# Patient Record
Sex: Female | Born: 1960
Health system: Southern US, Community
[De-identification: ages and names within clinical notes are randomized; demographics above are authoritative.]

## PROBLEM LIST (undated history)

## (undated) DIAGNOSIS — J189 Pneumonia, unspecified organism: Secondary | ICD-10-CM

## (undated) DIAGNOSIS — R197 Diarrhea, unspecified: Secondary | ICD-10-CM

## (undated) DIAGNOSIS — R6889 Other general symptoms and signs: Secondary | ICD-10-CM

## (undated) DIAGNOSIS — Z9119 Patient's noncompliance with other medical treatment and regimen: Secondary | ICD-10-CM

## (undated) DIAGNOSIS — Z91199 Patient's noncompliance with other medical treatment and regimen due to unspecified reason: Secondary | ICD-10-CM

## (undated) DIAGNOSIS — F329 Major depressive disorder, single episode, unspecified: Secondary | ICD-10-CM

## (undated) DIAGNOSIS — K219 Gastro-esophageal reflux disease without esophagitis: Secondary | ICD-10-CM

## (undated) DIAGNOSIS — E559 Vitamin D deficiency, unspecified: Secondary | ICD-10-CM

## (undated) DIAGNOSIS — N390 Urinary tract infection, site not specified: Secondary | ICD-10-CM

## (undated) DIAGNOSIS — L511 Stevens-Johnson syndrome: Secondary | ICD-10-CM

## (undated) DIAGNOSIS — E079 Disorder of thyroid, unspecified: Secondary | ICD-10-CM

## (undated) DIAGNOSIS — R51 Headache: Secondary | ICD-10-CM

## (undated) DIAGNOSIS — D649 Anemia, unspecified: Secondary | ICD-10-CM

## (undated) DIAGNOSIS — R519 Headache, unspecified: Secondary | ICD-10-CM

## (undated) DIAGNOSIS — M797 Fibromyalgia: Secondary | ICD-10-CM

## (undated) DIAGNOSIS — R413 Other amnesia: Secondary | ICD-10-CM

## (undated) DIAGNOSIS — Z8719 Personal history of other diseases of the digestive system: Secondary | ICD-10-CM

## (undated) DIAGNOSIS — F419 Anxiety disorder, unspecified: Secondary | ICD-10-CM

## (undated) DIAGNOSIS — M329 Systemic lupus erythematosus, unspecified: Secondary | ICD-10-CM

## (undated) DIAGNOSIS — F32A Depression, unspecified: Secondary | ICD-10-CM

## (undated) DIAGNOSIS — IMO0002 Reserved for concepts with insufficient information to code with codable children: Secondary | ICD-10-CM

## (undated) DIAGNOSIS — E119 Type 2 diabetes mellitus without complications: Secondary | ICD-10-CM

## (undated) HISTORY — PX: LIVER SURGERY: SHX698

## (undated) HISTORY — PX: TONSILLECTOMY: SUR1361

## (undated) HISTORY — PX: CHOLECYSTECTOMY: SHX55

---

## 2013-05-06 ENCOUNTER — Other Ambulatory Visit (HOSPITAL_COMMUNITY): Payer: Self-pay | Admitting: Family Medicine

## 2013-05-06 DIAGNOSIS — M5412 Radiculopathy, cervical region: Secondary | ICD-10-CM

## 2013-05-09 ENCOUNTER — Ambulatory Visit (HOSPITAL_COMMUNITY): Payer: Self-pay

## 2013-09-20 ENCOUNTER — Emergency Department (HOSPITAL_COMMUNITY)
Admission: EM | Admit: 2013-09-20 | Discharge: 2013-09-21 | Disposition: A | Discharge status: Home / Self Care | Payer: Self-pay | Attending: Emergency Medicine | Admitting: Emergency Medicine

## 2013-09-20 ENCOUNTER — Emergency Department (HOSPITAL_COMMUNITY): Payer: Self-pay

## 2013-09-20 ENCOUNTER — Encounter (HOSPITAL_COMMUNITY): Payer: Self-pay | Admitting: Emergency Medicine

## 2013-09-20 DIAGNOSIS — Y929 Unspecified place or not applicable: Secondary | ICD-10-CM | POA: Insufficient documentation

## 2013-09-20 DIAGNOSIS — IMO0001 Reserved for inherently not codable concepts without codable children: Secondary | ICD-10-CM

## 2013-09-20 DIAGNOSIS — E119 Type 2 diabetes mellitus without complications: Secondary | ICD-10-CM | POA: Insufficient documentation

## 2013-09-20 DIAGNOSIS — F17200 Nicotine dependence, unspecified, uncomplicated: Secondary | ICD-10-CM | POA: Insufficient documentation

## 2013-09-20 DIAGNOSIS — R404 Transient alteration of awareness: Secondary | ICD-10-CM | POA: Insufficient documentation

## 2013-09-20 DIAGNOSIS — Y9389 Activity, other specified: Secondary | ICD-10-CM | POA: Insufficient documentation

## 2013-09-20 DIAGNOSIS — IMO0002 Reserved for concepts with insufficient information to code with codable children: Secondary | ICD-10-CM | POA: Insufficient documentation

## 2013-09-20 DIAGNOSIS — Z880 Allergy status to penicillin: Secondary | ICD-10-CM | POA: Insufficient documentation

## 2013-09-20 HISTORY — DX: Disorder of thyroid, unspecified: E07.9

## 2013-09-20 HISTORY — DX: Type 2 diabetes mellitus without complications: E11.9

## 2013-09-20 LAB — ETHANOL

## 2013-09-20 MED ORDER — NALOXONE HCL 1 MG/ML IJ SOLN
1.0000 mg | Freq: Once | INTRAMUSCULAR | Status: AC
  Administered 2013-09-20: 1 mg via INTRAVENOUS

## 2013-09-20 MED ORDER — NALOXONE HCL 1 MG/ML IJ SOLN
INTRAMUSCULAR | Status: AC
  Filled 2013-09-20: qty 2
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
## 2013-09-21 ENCOUNTER — Emergency Department (HOSPITAL_COMMUNITY): Payer: No Typology Code available for payment source

## 2013-09-21 MED ORDER — HYDROCODONE-ACETAMINOPHEN 5-325 MG PO TABS
2.0000 | ORAL_TABLET | Freq: Once | ORAL | Status: AC
Start: 2013-09-21 — End: 2013-09-21
  Administered 2013-09-21: 2 via ORAL
  Filled 2013-09-21: qty 2

## 2013-09-21 MED ORDER — HYDROCODONE-ACETAMINOPHEN 5-325 MG PO TABS
1.0000 | ORAL_TABLET | Freq: Once | ORAL | Status: AC
  Administered 2013-09-21: 1 via ORAL
  Filled 2013-09-21: qty 1

## 2013-09-21 MED ORDER — HYDROCODONE-ACETAMINOPHEN 5-325 MG PO TABS: 1.0000 | ORAL_TABLET | ORAL | Status: DC | PRN
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
## 2013-09-21 NOTE — ED Notes (Signed)
Pt. Alert and oriented. Pt. Speaking in complete sentences. Pt. No longer slurring words.

## 2013-09-21 NOTE — ED Notes (Signed)
Per Dr. Jodi MourningZavitz, physical therapy to come in the morning to apply back brace.
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
## 2013-09-21 NOTE — ED Notes (Signed)
Family can be contacted at [phone]
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
## 2013-09-21 NOTE — ED Notes (Signed)
Dr. Ward at bedside.

## 2013-09-21 NOTE — ED Notes (Signed)
Informed of 11 am appointment to place ortho device.

## 2013-09-26 ENCOUNTER — Telehealth: Payer: Self-pay | Admitting: Orthopedic Surgery
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
## 2013-09-26 NOTE — Telephone Encounter (Signed)
BRING BRACE IN FOR FITTING   WHEN WAS SHE TOLD TO FOLLOW UP ???

## 2013-09-27 ENCOUNTER — Encounter: Payer: Self-pay | Admitting: Orthopedic Surgery

## 2013-09-27 ENCOUNTER — Ambulatory Visit (INDEPENDENT_AMBULATORY_CARE_PROVIDER_SITE_OTHER): Payer: Self-pay | Admitting: Orthopedic Surgery

## 2013-09-27 VITALS — BP 103/65 | Ht 59.0 in | Wt 121.4 lb

## 2013-09-27 DIAGNOSIS — IMO0002 Reserved for concepts with insufficient information to code with codable children: Secondary | ICD-10-CM

## 2013-09-27 DIAGNOSIS — S22009A Unspecified fracture of unspecified thoracic vertebra, initial encounter for closed fracture: Secondary | ICD-10-CM

## 2013-09-27 DIAGNOSIS — S22009D Unspecified fracture of unspecified thoracic vertebra, subsequent encounter for fracture with routine healing: Secondary | ICD-10-CM

## 2013-09-27 MED ORDER — HYDROCODONE-ACETAMINOPHEN 5-325 MG PO TABS: 1.0000 | ORAL_TABLET | ORAL | Status: DC | PRN
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
## 2013-09-27 NOTE — Telephone Encounter (Signed)
Forward to nurse to review hospital notes as to when patient is to return, and as to whether we can assist with fitting, and also regarding scheduling.

## 2013-09-27 NOTE — Patient Instructions (Signed)
Minimal activity You will return to physical therapy for brace refitting

## 2013-09-27 NOTE — Telephone Encounter (Signed)
Called patient and advised to come in for a 4:30 appointment for today 09/27/13, and to bring brace for fitting per Dr. Romeo AppleHarrison.

## 2013-11-14 ENCOUNTER — Encounter: Payer: Self-pay | Admitting: Orthopedic Surgery

## 2013-11-14 ENCOUNTER — Ambulatory Visit: Payer: Self-pay | Admitting: Orthopedic Surgery

## 2014-04-11 ENCOUNTER — Encounter (INDEPENDENT_AMBULATORY_CARE_PROVIDER_SITE_OTHER): Payer: Self-pay | Admitting: *Deleted

## 2014-06-29 ENCOUNTER — Emergency Department (HOSPITAL_COMMUNITY): Payer: BLUE CROSS/BLUE SHIELD

## 2014-06-29 ENCOUNTER — Encounter (HOSPITAL_COMMUNITY): Payer: Self-pay | Admitting: Emergency Medicine

## 2014-06-29 ENCOUNTER — Inpatient Hospital Stay (HOSPITAL_COMMUNITY)
Admission: EM | Admit: 2014-06-29 | Discharge: 2014-07-03 | DRG: 494 | Disposition: A | Discharge status: Home Health | Payer: BLUE CROSS/BLUE SHIELD | Attending: Orthopedic Surgery | Admitting: Orthopedic Surgery

## 2014-06-29 DIAGNOSIS — Z880 Allergy status to penicillin: Secondary | ICD-10-CM

## 2014-06-29 DIAGNOSIS — S82209A Unspecified fracture of shaft of unspecified tibia, initial encounter for closed fracture: Secondary | ICD-10-CM

## 2014-06-29 DIAGNOSIS — E119 Type 2 diabetes mellitus without complications: Secondary | ICD-10-CM | POA: Diagnosis present

## 2014-06-29 DIAGNOSIS — S82401A Unspecified fracture of shaft of right fibula, initial encounter for closed fracture: Secondary | ICD-10-CM

## 2014-06-29 DIAGNOSIS — S82143A Displaced bicondylar fracture of unspecified tibia, initial encounter for closed fracture: Secondary | ICD-10-CM | POA: Diagnosis present

## 2014-06-29 DIAGNOSIS — W11XXXA Fall on and from ladder, initial encounter: Secondary | ICD-10-CM | POA: Diagnosis present

## 2014-06-29 DIAGNOSIS — Y92007 Garden or yard of unspecified non-institutional (private) residence as the place of occurrence of the external cause: Secondary | ICD-10-CM

## 2014-06-29 DIAGNOSIS — S82141A Displaced bicondylar fracture of right tibia, initial encounter for closed fracture: Principal | ICD-10-CM

## 2014-06-29 DIAGNOSIS — M79604 Pain in right leg: Secondary | ICD-10-CM | POA: Diagnosis present

## 2014-06-29 DIAGNOSIS — F17210 Nicotine dependence, cigarettes, uncomplicated: Secondary | ICD-10-CM | POA: Diagnosis present

## 2014-06-29 DIAGNOSIS — Z01818 Encounter for other preprocedural examination: Secondary | ICD-10-CM

## 2014-06-29 DIAGNOSIS — S82202C Unspecified fracture of shaft of left tibia, initial encounter for open fracture type IIIA, IIIB, or IIIC: Secondary | ICD-10-CM

## 2014-06-29 HISTORY — DX: Fibromyalgia: M79.7

## 2014-06-29 LAB — CBC
HEMATOCRIT: 40.1 % (ref 36.0–46.0)
Hemoglobin: 13.2 g/dL (ref 12.0–15.0)
MCH: 30.9 pg (ref 26.0–34.0)
MCHC: 32.9 g/dL (ref 30.0–36.0)
MCV: 93.9 fL (ref 78.0–100.0)
PLATELETS: 196 10*3/uL (ref 150–400)
RBC: 4.27 MIL/uL (ref 3.87–5.11)
RDW: 13.3 % (ref 11.5–15.5)
WBC: 20 10*3/uL — AB (ref 4.0–10.5)

## 2014-06-29 LAB — BASIC METABOLIC PANEL
Anion gap: 6 (ref 5–15)
BUN: 15 mg/dL (ref 6–23)
CALCIUM: 9.5 mg/dL (ref 8.4–10.5)
CHLORIDE: 108 mmol/L (ref 96–112)
CO2: 24 mmol/L (ref 19–32)
Creatinine, Ser: 0.5 mg/dL (ref 0.50–1.10)
GFR calc Af Amer: >90 mL/min (ref >90)
GFR calc non Af Amer: >90 mL/min (ref >90)
Glucose, Bld: 152 mg/dL — ABNORMAL HIGH (ref 70–99)
Potassium: 3.5 mmol/L (ref 3.5–5.1)
Sodium: 138 mmol/L (ref 135–145)

## 2014-06-29 MED ORDER — ONDANSETRON HCL 4 MG/2ML IJ SOLN
4.0000 mg | Freq: Once | INTRAMUSCULAR | Status: AC
  Administered 2014-06-29: 4 mg via INTRAVENOUS
  Filled 2014-06-29: qty 2

## 2014-06-29 MED ORDER — FENTANYL CITRATE 0.05 MG/ML IJ SOLN
100.0000 ug | INTRAMUSCULAR | Status: AC | PRN
  Administered 2014-06-29 (×3): 100 ug via INTRAVENOUS
  Filled 2014-06-29 (×3): qty 2

## 2014-06-29 MED ORDER — FENTANYL CITRATE 0.05 MG/ML IJ SOLN
50.0000 ug | Freq: Once | INTRAMUSCULAR | Status: AC
  Administered 2014-06-29: 50 ug via INTRAVENOUS
  Filled 2014-06-29: qty 2

## 2014-06-29 MED ORDER — HEPARIN SODIUM (PORCINE) 5000 UNIT/ML IJ SOLN
5000.0000 [IU] | Freq: Once | INTRAMUSCULAR | Status: AC
  Administered 2014-06-29: 5000 [IU] via SUBCUTANEOUS
  Filled 2014-06-29: qty 1

## 2014-06-29 NOTE — ED Provider Notes (Signed)
11:00 PM  Dr. Romeo AppleHarrison at bedside. He will admit patient. He helped nursing staff place knee immobilizer.  Layla MawKristen N Asa Fath, DO 06/29/14 [id number]

## 2014-06-29 NOTE — ED Notes (Signed)
Pt c/o right leg pain after falling off stool, there is obvious deformity of leg.

## 2014-06-29 NOTE — ED Provider Notes (Signed)
CSN: [id number]     Arrival date & time 06/29/14  2002 History   First MD Initiated Contact with Patient 06/29/14 2017     Chief Complaint  Patient presents with  . Leg Pain     (Consider location/radiation/quality/duration/timing/severity/associated sxs/prior Treatment) Patient is a 54 y.o. female presenting with leg pain. The history is provided by the patient.  Leg Pain Shaundrea Carrigg is a 54 y.o. female who was in her yard, when she fell off a stool injuring her right leg. She states that the lower leg was crooked but she straightened it. She was unable to get up and walk. She "slid on my butt." And ultimately got to her car, and drove here. She feels a popping sensation in her left knee, since the fall. She denies head, neck or back injury. There are no other no modifying factors.   Past Medical History  Diagnosis Date  . Thyroid disease   . Fibromyalgia   . Diabetes mellitus without complication     type 2   Past Surgical History  Procedure Laterality Date  . Tonsillectomy    . Cholecystectomy      2 bifurcations of liver during lap chole  . Liver surgery      x4 due to bifurcations from lap chole   History reviewed. No pertinent family history. History  Substance Use Topics  . Smoking status: Current Every Day Smoker -- 0.50 packs/day for 30 years    Types: Cigarettes  . Smokeless tobacco: Not on file  . Alcohol Use: No   OB History    No data available     Review of Systems  All other systems reviewed and are negative.     Allergies  Penicillins and Sulfa antibiotics  Home Medications   Prior to Admission medications   Medication Sig Start Date End Date Taking? Authorizing Provider  Albiglutide 50 MG PEN Inject 150 Units into the skin once a week.   Yes Historical Provider, MD  Insulin Glargine (TOUJEO SOLOSTAR [place]) Inject 20 Units into the skin at bedtime.   Yes Historical Provider, MD  traMADol (ULTRAM) 50 MG tablet Take 100 mg by mouth 2 (two) times  daily.   Yes Historical Provider, MD  UNKNOWN TO PATIENT    Yes Historical Provider, MD   BP 146/70 mmHg  Pulse 93  Temp(Src) 97.7 F (36.5 C) (Oral)  Resp 17  Ht  (1.499 m)  Wt 128 lb 4.9 oz (58.2 kg)  BMI 25.90 kg/m2  SpO2 92% Physical Exam  Constitutional: She is oriented to person, place, and time. She appears well-developed and well-nourished.  HENT:  Head: Normocephalic and atraumatic.  Right Ear: External ear normal.  Left Ear: External ear normal.  Eyes: Conjunctivae and EOM are normal. Pupils are equal, round, and reactive to light.  Neck: Normal range of motion and phonation normal. Neck supple.  Cardiovascular: Normal rate, regular rhythm and normal heart sounds.   Pulmonary/Chest: Effort normal and breath sounds normal. She exhibits no bony tenderness.  Abdominal: Soft. There is no tenderness.  Musculoskeletal:  Right knee, with large effusion. There is a dimple, lateral, just below the knee, consistent with a proximal lower leg deformity. She has marked tenderness of the right proximal lower leg with swelling at this site. She is neurovascular intact distally in the right foot. Left D has mild crepitation with range of motion but no deformity or swelling.  Neurological: She is alert and oriented to person, place, and time.  No cranial nerve deficit or sensory deficit. She exhibits normal muscle tone. Coordination normal.  Skin: Skin is warm, dry and intact.  Psychiatric: She has a normal mood and affect. Her behavior is normal. Judgment and thought content normal.  Nursing note and vitals reviewed.     ED Course  Procedures (including critical care time)    Medications  levothyroxine (SYNTHROID, LEVOTHROID) tablet 300 mcg ( Oral Automatically Held 07/08/14 0800)  0.9 %  sodium chloride infusion ( Intravenous New Bag/Given 06/30/14 0059)  HYDROmorphone (DILAUDID) injection 1 mg ( Intravenous MAR Hold 06/30/14 0924)  oxyCODONE-acetaminophen (PERCOCET/ROXICET)  5-325 MG per tablet 1 tablet ( Oral Automatically Held 07/08/14 2045)  ondansetron (ZOFRAN) injection 4 mg ( Intravenous MAR Hold 06/30/14 0924)  lactated ringers infusion ( Intravenous Anesthesia Volume Adjustment 06/30/14 1211)  midazolam (VERSED) injection 1-2 mg (2 mg Intravenous Given 06/30/14 1009)  fentaNYL (SUBLIMAZE) injection 25-50 mcg (25 mcg Intravenous Given 06/30/14 1303)  ondansetron (ZOFRAN) injection 4 mg (not administered)  fentaNYL (SUBLIMAZE) injection 50 mcg (50 mcg Intravenous Given 06/29/14 2313)  fentaNYL (SUBLIMAZE) injection 100 mcg (100 mcg Intravenous Given 06/29/14 2226)  ondansetron (ZOFRAN) injection 4 mg (4 mg Intravenous Given 06/29/14 2026)  chlorhexidine (HIBICLENS) 4 % liquid 4 application (4 application Topical Given 06/30/14 0459)  clindamycin (CLEOCIN) IVPB 900 mg (900 mg Intravenous Given 06/30/14 1035)  heparin injection 5,000 Units (5,000 Units Subcutaneous Given 06/29/14 2338)  ondansetron (ZOFRAN) injection 4 mg (4 mg Intravenous Given 06/30/14 1011)    Patient Vitals for the past 24 hrs:  BP Temp Temp src Pulse Resp SpO2 Height Weight  06/30/14 1251 - - - 93 17 92 % - -  06/30/14 1245 (!) 146/70 mmHg - - 99 14 91 % - -  06/30/14 1240 (!) 150/73 mmHg - - - - 93 % - -  06/30/14 1230 (!) 150/73 mmHg - - 94 18 94 % - -  06/30/14 1215 (!) 158/53 mmHg - - 90 16 96 % - -  06/30/14 1210 (!) 147/74 mmHg 97.7 F (36.5 C) - - 16 99 % - -  06/30/14 1015 104/70 mmHg - - - 14 98 % - -  06/30/14 1010 107/69 mmHg - - - 11 97 % - -  06/30/14 1005 - - - - 17 95 % - -  06/30/14 1000 105/67 mmHg - - - (!) 24 97 % - -  06/30/14 0955 105/67 mmHg - - - 17 97 % - -  06/30/14 0950 109/63 mmHg - - - 18 95 % - -  06/30/14 0944 96/62 mmHg 98.3 F (36.8 C) Oral 85 (!) 21 94 % - -  06/30/14 0700 107/61 mmHg 98 F (36.7 C) Oral 79 18 95 % - -  06/30/14 0041 118/69 mmHg 98.4 F (36.9 C) Oral 82 18 98 %  (1.499 m) 128 lb 4.9 oz (58.2 kg)  06/29/14 2300 106/76 mmHg - - 87 - 97 % - -    06/29/14 2230 114/75 mmHg - - 104 - 93 % - -  06/29/14 2113 117/71 mmHg 98.5 F (36.9 C) Oral 87 18 95 % - -    9:10 PM Reevaluation with update and discussion. After initial assessment and treatment, an updated evaluation reveals clinical examination is unchanged. Above photo done at this time. Right lower leg. Distal compartments remain soft and her right foot is sensate with a normal pulse in the right foot.Mancel Bale L   21:15- orthopedist, Dr. Romeo Apple, contacted. He  wants a CT of the right knee, and he will see the patient in the emergency department.    Labs Review Labs Reviewed  CBC - Abnormal; Notable for the following:    WBC 20.0 (*)    All other components within normal limits  BASIC METABOLIC PANEL - Abnormal; Notable for the following:    Glucose, Bld 152 (*)    All other components within normal limits  GLUCOSE, CAPILLARY - Abnormal; Notable for the following:    Glucose-Capillary 145 (*)    All other components within normal limits  GLUCOSE, CAPILLARY - Abnormal; Notable for the following:    Glucose-Capillary 148 (*)    All other components within normal limits  SURGICAL PCR SCREEN  PROTIME-INR    Imaging Review Dg Chest 2 View  06/29/2014   CLINICAL DATA:  Larey Seat from step stool with obvious right knee deformity  EXAM: CHEST  2 VIEW  COMPARISON:  09/20/2013  FINDINGS: The heart size and mediastinal contours are within normal limits. Both lungs are clear. The visualized skeletal structures are unremarkable.  IMPRESSION: No active cardiopulmonary disease.   Electronically Signed   By: Alcide Clever M.D.   On: 06/29/2014 21:18   Dg Tibia/fibula Right  06/30/2014   CLINICAL DATA:  Bicondylar tibial plateau fracture, external fixation placement  EXAM: DG C-ARM 61-120 MIN; RIGHT TIBIA AND FIBULA - 2 VIEW  TECHNIQUE: Five digital C-arm fluoroscopic images submitted.  CONTRAST:  None utilized  FLUOROSCOPY TIME:  Radiation Exposure Index (as provided by the fluoroscopic  device): 2.64 mGy  If the device does not provide the exposure index:  Fluoroscopy Time (in minutes and seconds):  Number of Acquired Images:  5  COMPARISON:  06/29/2014  FINDINGS: Osseous demineralization.  Displaced lateral tibial plateau fracture.  Additionally, nondisplaced fracture fragment involving medial tibial plateau and spinous region.  Images demonstrate placement of 2 anchors for external fixation device within the tibial diaphysis and 2 anchors within the femoral diaphysis.  Incidentally noted minimally displaced fibular neck fracture.  IMPRESSION: Post external fixation device placement at the femur and tibia across stay comminuted displaced fracture of the proximal RIGHT tibia.  Displaced fibular neck fracture.  Osseous demineralization.   Electronically Signed   By: Ulyses Southward M.D.   On: 06/30/2014 12:56   Dg Tibia/fibula Right  06/29/2014   CLINICAL DATA:  Severe right knee pain radiating into the right lower leg. Left knee discomfort and bruising. Fall.  EXAM: RIGHT TIBIA AND FIBULA - 2 VIEW  COMPARISON:  None.  FINDINGS: Comminuted fractures of the proximal right tibial and fibular metaphysis. Tibial fracture lines extend to the lateral tibial plateau with displaced lateral tibial plateau fragment and large defect at the articular surface. Fracture lines probably extend to the anterior tibial spine. There is mild anterior displacement and posterior angulation of the distal fracture fragment with depression of the tibial plateau fragment and femoral head fragments.  IMPRESSION: Comminuted fractures of the proximal right tibia and fibula with intra-articular extension to the lateral tibial plateau and anterior tibial spine. Displaced lateral tibial plateau fracture fragment resulting in large articular surface gap.   Electronically Signed   By: Burman Nieves M.D.   On: 06/29/2014 21:17   Dg Knee Complete 4 Views Left  06/29/2014   CLINICAL DATA:  Fall from steps stool with left knee pain,  initial encounter  EXAM: LEFT KNEE - COMPLETE 4+ VIEW  COMPARISON:  None.  FINDINGS: No acute fracture or dislocation is noted. Mild  degenerative changes are noted in all 3 joint compartments. No joint effusion is seen.  IMPRESSION: Degenerative change without acute abnormality.   Electronically Signed   By: Alcide Clever M.D.   On: 06/29/2014 21:16   Dg Knee Complete 4 Views Right  06/29/2014   CLINICAL DATA:  Status post fall with right knee pain  EXAM: RIGHT KNEE - COMPLETE 4+ VIEW  COMPARISON:  None.  FINDINGS: There is comminuted displaced intra-articular fracture of the proximal tibia. There is comminuted displaced intra-articular fracture of the proximal fibula. There is a displaced fracture of the medial distal femur. A suprapatellar effusion with fat fluid level is identified.  IMPRESSION: Comminuted displaced intra-articular fractures of the proximal tibia and fibula as well as displaced fracture at the medial distal femur as described.   Electronically Signed   By: Sherian Rein M.D.   On: 06/29/2014 21:17   Dg C-arm 1-60 Min  06/30/2014   CLINICAL DATA:  Bicondylar tibial plateau fracture, external fixation placement  EXAM: DG C-ARM 61-120 MIN; RIGHT TIBIA AND FIBULA - 2 VIEW  TECHNIQUE: Five digital C-arm fluoroscopic images submitted.  CONTRAST:  None utilized  FLUOROSCOPY TIME:  Radiation Exposure Index (as provided by the fluoroscopic device): 2.64 mGy  If the device does not provide the exposure index:  Fluoroscopy Time (in minutes and seconds):  Number of Acquired Images:  5  COMPARISON:  06/29/2014  FINDINGS: Osseous demineralization.  Displaced lateral tibial plateau fracture.  Additionally, nondisplaced fracture fragment involving medial tibial plateau and spinous region.  Images demonstrate placement of 2 anchors for external fixation device within the tibial diaphysis and 2 anchors within the femoral diaphysis.  Incidentally noted minimally displaced fibular neck fracture.  IMPRESSION: Post  external fixation device placement at the femur and tibia across stay comminuted displaced fracture of the proximal RIGHT tibia.  Displaced fibular neck fracture.  Osseous demineralization.   Electronically Signed   By: Ulyses Southward M.D.   On: 06/30/2014 12:56   Ct Extrem Lower Wo Cm Bil  06/29/2014   CLINICAL DATA:  Tibia fracture.  EXAM: CT OF THE LOWER RIGHT EXTREMITY WITHOUT CONTRAST  TECHNIQUE: Multidetector CT imaging of the right was performed according to the standard protocol.  COMPARISON:  None.  FINDINGS: There is comminuted fracturing of the right tibial plateau with marked distortion of the articular surface from fracture widening, rotation, and central depression. Greatest articular surface offset measures 8 mm. There is a horizontal fracture through the metaphysis which impacted posteriorly. An oblique fracture line continues into the medial tibial plateau without depression.  Comminuted fracturing of the fibular head neck with impaction.  Nondisplaced fracture through the medial femur without continuation to the articular surface.  Lipohemarthrosis which is large. There is diffuse soft tissue swelling. The cruciates are grossly intact. The extensor mechanism is grossly intact. Posterior lateral ligaments poorly assessed due to extensive distortion and swelling.  IMPRESSION: 1. Bilateral tibial plateau and tibial metaphysis fractures. 2. Marked distortion of the lateral tibial plateau due to fracture depression. 3. Medial tibial plateau fracture is nondisplaced. 4. Comminuted fibular head/neck fracture. 5. Proximal MCL avulsion fracture.   Electronically Signed   By: Marnee Spring M.D.   On: 06/29/2014 22:36     EKG Interpretation   Date/Time:  Thursday June 29 2014 23:33:43 EDT Ventricular Rate:  85 PR Interval:  143 QRS Duration: 83 QT Interval:  368 QTC Calculation: 438 R Axis:   47 Text Interpretation:  Sinus rhythm RSR' in V1 or  V2, right VCD or RVH  Otherwise within normal  limits No old tracing to compare Confirmed by  [facility] LLCGLICK  MD, DAVID ([id number]) on 06/29/2014 11:40:33 PM      MDM   Final diagnoses:  Tibia fracture, left, open type III, initial encounter  Fibula fracture, right, closed, initial encounter    Complicated tibia and fibular which will require operative repair. Apparent mechanical fall.  Nursing Notes Reviewed/ Care Coordinated Applicable Imaging Reviewed Interpretation of Laboratory Data incorporated into ED treatment   Plan: Admit    Mancel BaleElliott Naesha Buckalew, MD 06/30/14 1317
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
## 2014-06-30 ENCOUNTER — Encounter (HOSPITAL_COMMUNITY): Payer: Self-pay | Admitting: *Deleted

## 2014-06-30 ENCOUNTER — Inpatient Hospital Stay (HOSPITAL_COMMUNITY): Payer: BLUE CROSS/BLUE SHIELD

## 2014-06-30 ENCOUNTER — Encounter (HOSPITAL_COMMUNITY)
Admission: EM | Disposition: A | Discharge status: Home Health | Payer: Self-pay | Source: Home / Self Care | Attending: Orthopedic Surgery

## 2014-06-30 ENCOUNTER — Inpatient Hospital Stay (HOSPITAL_COMMUNITY): Payer: BLUE CROSS/BLUE SHIELD | Admitting: Anesthesiology

## 2014-06-30 HISTORY — PX: EXTERNAL FIXATION LEG: SHX1549

## 2014-06-30 LAB — CBC
HCT: 36.4 % (ref 36.0–46.0)
HEMOGLOBIN: 11.5 g/dL — AB (ref 12.0–15.0)
MCH: 30 pg (ref 26.0–34.0)
MCHC: 31.6 g/dL (ref 30.0–36.0)
MCV: 95 fL (ref 78.0–100.0)
PLATELETS: 179 10*3/uL (ref 150–400)
RBC: 3.83 MIL/uL — ABNORMAL LOW (ref 3.87–5.11)
RDW: 13.7 % (ref 11.5–15.5)
WBC: 12.2 10*3/uL — AB (ref 4.0–10.5)

## 2014-06-30 LAB — GLUCOSE, CAPILLARY
GLUCOSE-CAPILLARY: 114 mg/dL — AB (ref 70–99)
Glucose-Capillary: 145 mg/dL — ABNORMAL HIGH (ref 70–99)
Glucose-Capillary: 148 mg/dL — ABNORMAL HIGH (ref 70–99)
Glucose-Capillary: 155 mg/dL — ABNORMAL HIGH (ref 70–99)

## 2014-06-30 LAB — CREATININE, SERUM
Creatinine, Ser: 0.47 mg/dL — ABNORMAL LOW (ref 0.50–1.10)
GFR calc non Af Amer: >90 mL/min (ref >90)

## 2014-06-30 LAB — SURGICAL PCR SCREEN
MRSA, PCR: NEGATIVE
Staphylococcus aureus: NEGATIVE

## 2014-06-30 LAB — PROTIME-INR
INR: 1.07 (ref 0.00–1.49)
PROTHROMBIN TIME: 14 s (ref 11.6–15.2)

## 2014-06-30 SURGERY — EXTERNAL FIXATION, LOWER EXTREMITY
Anesthesia: General | Laterality: Right

## 2014-06-30 MED ORDER — MIDAZOLAM HCL 2 MG/2ML IJ SOLN
INTRAMUSCULAR | Status: AC
  Filled 2014-06-30: qty 2

## 2014-06-30 MED ORDER — ONDANSETRON HCL 4 MG/2ML IJ SOLN: 4.0000 mg | Freq: Four times a day (QID) | INTRAMUSCULAR | Status: DC | PRN

## 2014-06-30 MED ORDER — FENTANYL CITRATE 0.05 MG/ML IJ SOLN
25.0000 ug | INTRAMUSCULAR | Status: DC | PRN
  Administered 2014-06-30 (×2): 25 ug via INTRAVENOUS
  Filled 2014-06-30: qty 2

## 2014-06-30 MED ORDER — PROPOFOL 10 MG/ML IV BOLUS
INTRAVENOUS | Status: DC | PRN
  Administered 2014-06-30: 120 mg via INTRAVENOUS

## 2014-06-30 MED ORDER — PROPOFOL 10 MG/ML IV BOLUS
INTRAVENOUS | Status: AC
  Filled 2014-06-30: qty 20

## 2014-06-30 MED ORDER — LIDOCAINE HCL 1 % IJ SOLN
INTRAMUSCULAR | Status: DC | PRN
  Administered 2014-06-30: 40 mg via INTRADERMAL

## 2014-06-30 MED ORDER — LACTATED RINGERS IV SOLN
INTRAVENOUS | Status: DC
  Administered 2014-06-30 (×2): via INTRAVENOUS

## 2014-06-30 MED ORDER — ONDANSETRON HCL 4 MG/2ML IJ SOLN
4.0000 mg | Freq: Once | INTRAMUSCULAR | Status: AC
  Administered 2014-06-30: 4 mg via INTRAVENOUS

## 2014-06-30 MED ORDER — LIDOCAINE HCL (PF) 1 % IJ SOLN
INTRAMUSCULAR | Status: AC
  Filled 2014-06-30: qty 5

## 2014-06-30 MED ORDER — CLINDAMYCIN PHOSPHATE 900 MG/50ML IV SOLN
900.0000 mg | INTRAVENOUS | Status: AC
  Administered 2014-06-30: 900 mg via INTRAVENOUS
  Filled 2014-06-30 (×2): qty 50

## 2014-06-30 MED ORDER — ONDANSETRON HCL 4 MG/2ML IJ SOLN: 4.0000 mg | Freq: Once | INTRAMUSCULAR | Status: DC | PRN

## 2014-06-30 MED ORDER — PHENOL 1.4 % MT LIQD: 1.0000 | OROMUCOSAL | Status: DC | PRN

## 2014-06-30 MED ORDER — NEOSTIGMINE METHYLSULFATE 10 MG/10ML IV SOLN
INTRAVENOUS | Status: DC | PRN
  Administered 2014-06-30: 2 mg via INTRAVENOUS

## 2014-06-30 MED ORDER — ONDANSETRON HCL 4 MG PO TABS: 4.0000 mg | ORAL_TABLET | Freq: Four times a day (QID) | ORAL | Status: DC | PRN

## 2014-06-30 MED ORDER — METOCLOPRAMIDE HCL 5 MG PO TABS
5.0000 mg | ORAL_TABLET | Freq: Three times a day (TID) | ORAL | Status: DC | PRN
  Filled 2014-06-30: qty 2

## 2014-06-30 MED ORDER — POLYETHYLENE GLYCOL 3350 17 G PO PACK
17.0000 g | PACK | Freq: Every day | ORAL | Status: DC | PRN
Start: 2014-06-30 — End: 2014-07-03

## 2014-06-30 MED ORDER — FENTANYL CITRATE 0.05 MG/ML IJ SOLN
INTRAMUSCULAR | Status: DC | PRN
  Administered 2014-06-30 (×9): 50 ug via INTRAVENOUS

## 2014-06-30 MED ORDER — ONDANSETRON HCL 4 MG/2ML IJ SOLN
INTRAMUSCULAR | Status: AC
  Filled 2014-06-30: qty 2

## 2014-06-30 MED ORDER — METHOCARBAMOL 500 MG PO TABS
500.0000 mg | ORAL_TABLET | Freq: Four times a day (QID) | ORAL | Status: DC | PRN
  Administered 2014-07-01 – 2014-07-03 (×4): 500 mg via ORAL
  Filled 2014-06-30 (×5): qty 1

## 2014-06-30 MED ORDER — DOCUSATE SODIUM 100 MG PO CAPS
100.0000 mg | ORAL_CAPSULE | Freq: Two times a day (BID) | ORAL | Status: DC
  Administered 2014-06-30 – 2014-07-02 (×6): 100 mg via ORAL
  Filled 2014-06-30 (×6): qty 1

## 2014-06-30 MED ORDER — METOCLOPRAMIDE HCL 10 MG PO TABS
5.0000 mg | ORAL_TABLET | Freq: Three times a day (TID) | ORAL | Status: DC | PRN
  Filled 2014-06-30: qty 1

## 2014-06-30 MED ORDER — HYDROMORPHONE HCL 1 MG/ML IJ SOLN
1.0000 mg | INTRAMUSCULAR | Status: DC | PRN
  Administered 2014-06-30 – 2014-07-03 (×13): 1 mg via INTRAVENOUS
  Filled 2014-06-30 (×15): qty 1

## 2014-06-30 MED ORDER — MENTHOL 3 MG MT LOZG: 1.0000 | LOZENGE | OROMUCOSAL | Status: DC | PRN

## 2014-06-30 MED ORDER — BUPIVACAINE-EPINEPHRINE (PF) 0.5% -1:200000 IJ SOLN
INTRAMUSCULAR | Status: AC
  Filled 2014-06-30: qty 60

## 2014-06-30 MED ORDER — ALUM & MAG HYDROXIDE-SIMETH 200-200-20 MG/5ML PO SUSP: 30.0000 mL | ORAL | Status: DC | PRN

## 2014-06-30 MED ORDER — SODIUM CHLORIDE 0.9 % IJ SOLN
INTRAMUSCULAR | Status: AC
  Filled 2014-06-30: qty 10

## 2014-06-30 MED ORDER — ACETAMINOPHEN 325 MG PO TABS: 650.0000 mg | ORAL_TABLET | Freq: Four times a day (QID) | ORAL | Status: DC | PRN

## 2014-06-30 MED ORDER — ROCURONIUM BROMIDE 100 MG/10ML IV SOLN
INTRAVENOUS | Status: DC | PRN
  Administered 2014-06-30: 30 mg via INTRAVENOUS

## 2014-06-30 MED ORDER — MAGNESIUM CITRATE PO SOLN: 1.0000 | Freq: Once | ORAL | Status: AC | PRN

## 2014-06-30 MED ORDER — BUPIVACAINE-EPINEPHRINE 0.5% -1:200000 IJ SOLN
INTRAMUSCULAR | Status: DC | PRN
  Administered 2014-06-30: 60 mL

## 2014-06-30 MED ORDER — POTASSIUM CHLORIDE IN NACL 20-0.9 MEQ/L-% IV SOLN
INTRAVENOUS | Status: DC
  Administered 2014-06-30: 1000 mL via INTRAVENOUS
  Administered 2014-07-01 – 2014-07-02 (×2): via INTRAVENOUS
  Administered 2014-07-02: 1000 mL via INTRAVENOUS

## 2014-06-30 MED ORDER — SODIUM CHLORIDE 0.9 % IV SOLN
INTRAVENOUS | Status: DC
  Administered 2014-06-30: 01:00:00 via INTRAVENOUS

## 2014-06-30 MED ORDER — ONDANSETRON HCL 4 MG/2ML IJ SOLN
4.0000 mg | Freq: Four times a day (QID) | INTRAMUSCULAR | Status: DC | PRN
  Administered 2014-07-01: 4 mg via INTRAVENOUS
  Filled 2014-06-30: qty 2

## 2014-06-30 MED ORDER — FENTANYL CITRATE 0.05 MG/ML IJ SOLN
INTRAMUSCULAR | Status: AC
  Filled 2014-06-30: qty 2

## 2014-06-30 MED ORDER — ENOXAPARIN SODIUM 30 MG/0.3ML ~~LOC~~ SOLN
30.0000 mg | SUBCUTANEOUS | Status: DC
  Administered 2014-07-01: 30 mg via SUBCUTANEOUS
  Filled 2014-06-30 (×2): qty 0.3

## 2014-06-30 MED ORDER — INSULIN GLARGINE 100 UNIT/ML ~~LOC~~ SOLN
20.0000 [IU] | Freq: Every day | SUBCUTANEOUS | Status: DC
  Administered 2014-07-01 – 2014-07-02 (×2): 20 [IU] via SUBCUTANEOUS
  Filled 2014-06-30 (×4): qty 0.2

## 2014-06-30 MED ORDER — DEXTROSE 5 % IV SOLN
500.0000 mg | Freq: Four times a day (QID) | INTRAVENOUS | Status: DC | PRN
  Administered 2014-06-30: 500 mg via INTRAVENOUS
  Filled 2014-06-30 (×2): qty 5

## 2014-06-30 MED ORDER — BISACODYL 10 MG RE SUPP: 10.0000 mg | Freq: Every day | RECTAL | Status: DC | PRN

## 2014-06-30 MED ORDER — GLYCOPYRROLATE 0.2 MG/ML IJ SOLN
INTRAMUSCULAR | Status: DC | PRN
  Administered 2014-06-30: 0.3 mg via INTRAVENOUS

## 2014-06-30 MED ORDER — LEVOTHYROXINE SODIUM 100 MCG PO TABS
300.0000 ug | ORAL_TABLET | Freq: Every day | ORAL | Status: DC
  Administered 2014-07-01 – 2014-07-03 (×3): 300 ug via ORAL
  Filled 2014-06-30 (×3): qty 3

## 2014-06-30 MED ORDER — FENTANYL CITRATE 0.05 MG/ML IJ SOLN
INTRAMUSCULAR | Status: AC
  Filled 2014-06-30: qty 5

## 2014-06-30 MED ORDER — TEMAZEPAM 15 MG PO CAPS: 15.0000 mg | ORAL_CAPSULE | Freq: Every evening | ORAL | Status: DC | PRN

## 2014-06-30 MED ORDER — PHENYLEPHRINE HCL 10 MG/ML IJ SOLN
INTRAMUSCULAR | Status: DC | PRN
  Administered 2014-06-30: 50 ug via INTRAVENOUS

## 2014-06-30 MED ORDER — CLINDAMYCIN PHOSPHATE 600 MG/50ML IV SOLN
600.0000 mg | Freq: Four times a day (QID) | INTRAVENOUS | Status: AC
  Administered 2014-06-30 (×2): 600 mg via INTRAVENOUS
  Filled 2014-06-30 (×2): qty 50

## 2014-06-30 MED ORDER — ROCURONIUM BROMIDE 50 MG/5ML IV SOLN
INTRAVENOUS | Status: AC
  Filled 2014-06-30: qty 1

## 2014-06-30 MED ORDER — SUCCINYLCHOLINE CHLORIDE 20 MG/ML IJ SOLN
INTRAMUSCULAR | Status: AC
  Filled 2014-06-30: qty 1

## 2014-06-30 MED ORDER — ACETAMINOPHEN 650 MG RE SUPP: 650.0000 mg | Freq: Four times a day (QID) | RECTAL | Status: DC | PRN

## 2014-06-30 MED ORDER — CHLORHEXIDINE GLUCONATE 4 % EX LIQD
60.0000 mL | Freq: Once | CUTANEOUS | Status: AC
  Administered 2014-06-30: 4 via TOPICAL
  Filled 2014-06-30: qty 60

## 2014-06-30 MED ORDER — MIDAZOLAM HCL 2 MG/2ML IJ SOLN
1.0000 mg | INTRAMUSCULAR | Status: DC | PRN
  Administered 2014-06-30: 2 mg via INTRAVENOUS

## 2014-06-30 MED ORDER — SODIUM CHLORIDE 0.9 % IR SOLN
Status: DC | PRN
  Administered 2014-06-30: 1000 mL

## 2014-06-30 MED ORDER — METOCLOPRAMIDE HCL 5 MG/ML IJ SOLN
5.0000 mg | Freq: Three times a day (TID) | INTRAMUSCULAR | Status: DC | PRN
  Administered 2014-07-01: 10 mg via INTRAVENOUS
  Filled 2014-06-30: qty 2

## 2014-06-30 MED ORDER — OXYCODONE-ACETAMINOPHEN 5-325 MG PO TABS
1.0000 | ORAL_TABLET | ORAL | Status: DC
  Administered 2014-06-30 – 2014-07-03 (×17): 1 via ORAL
  Filled 2014-06-30 (×18): qty 1

## 2014-06-30 SURGICAL SUPPLY — 48 items
BAG HAMPER (MISCELLANEOUS) ×2 IMPLANT
BANDAGE ELASTIC 3 VELCRO NS (GAUZE/BANDAGES/DRESSINGS) IMPLANT
BANDAGE ELASTIC 4 VELCRO NS (GAUZE/BANDAGES/DRESSINGS) ×4 IMPLANT
BANDAGE ELASTIC 6 VELCRO NS (GAUZE/BANDAGES/DRESSINGS) ×2 IMPLANT
BANDAGE ESMARK 4X12 BL STRL LF (DISPOSABLE) ×1 IMPLANT
BNDG COHESIVE 4X5 TAN STRL (GAUZE/BANDAGES/DRESSINGS) ×2 IMPLANT
BNDG ESMARK 4X12 BLUE STRL LF (DISPOSABLE) ×2
BNDG GAUZE ELAST 4 BULKY (GAUZE/BANDAGES/DRESSINGS) ×4 IMPLANT
CAP PROTECTIVE 5.0MM (DRAIN) ×8 IMPLANT
CAP PROTECTIVE FIX ROD 11 (MISCELLANEOUS) ×8 IMPLANT
CHLORAPREP W/TINT 26ML (MISCELLANEOUS) ×4 IMPLANT
CLAMP ADJUSTABLE (Clamp) ×6 IMPLANT
CLAMP LG COMBINATION (Clamp) ×4 IMPLANT
CLOTH BEACON ORANGE TIMEOUT ST (SAFETY) ×2 IMPLANT
COVER LIGHT HANDLE STERIS (MISCELLANEOUS) ×6 IMPLANT
DECANTER SPIKE VIAL GLASS SM (MISCELLANEOUS) ×4 IMPLANT
DRAPE C-ARM FOLDED MOBILE STRL (DRAPES) ×2 IMPLANT
DRAPE PROXIMA HALF (DRAPES) ×6 IMPLANT
GAUZE SPONGE 4X4 12PLY STRL (GAUZE/BANDAGES/DRESSINGS) IMPLANT
GAUZE XEROFORM 5X9 LF (GAUZE/BANDAGES/DRESSINGS) ×2 IMPLANT
GLOVE SKINSENSE NS SZ8.0 LF (GLOVE) ×2
GLOVE SKINSENSE STRL SZ8.0 LF (GLOVE) ×2 IMPLANT
GLOVE SS N UNI LF 8.5 STRL (GLOVE) ×2 IMPLANT
GOWN STRL REUS W/TWL LRG LVL3 (GOWN DISPOSABLE) ×4 IMPLANT
GOWN STRL REUS W/TWL XL LVL3 (GOWN DISPOSABLE) ×2 IMPLANT
INST SET MINOR BONE (KITS) ×2 IMPLANT
KIT ROOM TURNOVER APOR (KITS) ×2 IMPLANT
MANIFOLD NEPTUNE II (INSTRUMENTS) ×2 IMPLANT
NEEDLE HYPO 25X1 1.5 SAFETY (NEEDLE) ×2 IMPLANT
NS IRRIG 1000ML POUR BTL (IV SOLUTION) ×2 IMPLANT
PACK BASIC LIMB (CUSTOM PROCEDURE TRAY) ×2 IMPLANT
PAD ARMBOARD 7.5X6 YLW CONV (MISCELLANEOUS) ×2 IMPLANT
ROD CRBN FBR LRG EX-FX 11X250 (Rod) ×2 IMPLANT
ROD CRBN FBR LRG EX-FX 11X400 (Rod) ×2 IMPLANT
SCREW SCHNZ SD 5.0 60 THRD/150 (Screw) ×2 IMPLANT
SCREW SHANZ 5.0X175MM (Screw) ×4 IMPLANT
SCRW SCHANZ SD 5.0 60 THRD/150 (Screw) ×4 IMPLANT
SPONGE GAUZE 4X4 12PLY (GAUZE/BANDAGES/DRESSINGS) ×4 IMPLANT
SPONGE LAP 18X18 X RAY DECT (DISPOSABLE) ×2 IMPLANT
SUT ETHILON 3 0 FSL (SUTURE) ×4 IMPLANT
SUT MON AB 2-0 SH 27 (SUTURE)
SUT MON AB 2-0 SH27 (SUTURE) IMPLANT
SUT NUROLON CT 2 BLK #1 18IN (SUTURE) ×2 IMPLANT
SUT VIC AB 1 CT1 27 (SUTURE)
SUT VIC AB 1 CT1 27XBRD ANTBC (SUTURE) IMPLANT
SYR BULB IRRIGATION 50ML (SYRINGE) ×2 IMPLANT
SYRINGE 10CC LL (SYRINGE) IMPLANT
TOWEL OR 17X26 4PK STRL BLUE (TOWEL DISPOSABLE) ×6 IMPLANT

## 2014-06-30 NOTE — Care Management Note (Addendum)
    Page 1 of 2   07/03/2014     2:48:58 PM CARE MANAGEMENT NOTE 07/03/2014  Patient:  Autumn Daniels,Autumn Daniels   Account Number:  [id number]  Date Initiated:  06/30/2014  Documentation initiated by:  Sharrie RothmanBLACKWELL,Ehab Humber C  Subjective/Objective Assessment:   Pt admitted from home with leg fracture. Pt lives alone and had been very independent with ADl's prior to fall and surgery.     Action/Plan:   Will need to be assessed by PT and arrange discharge according to PT recommedations.   Anticipated DC Date:  07/03/2014   Anticipated DC Plan:  HOME W HOME HEALTH SERVICES      DC Planning Services  CM consult      PAC Choice  DURABLE MEDICAL EQUIPMENT  HOME HEALTH   Choice offered to / List presented to:  C-1 Patient   DME arranged  WALKER - ROLLING      DME agency  [facility]     HH arranged  HH[facility]   Status of service:  Completed, signed off Medicare Important Message given?   (If response is "NO", the following Medicare IM given date fields will be blank) Date Medicare IM given:   Medicare IM given by:   Date Additional Medicare IM given:   Additional Medicare IM given by:    Discharge Disposition:  HOME W HOME HEALTH SERVICES  Per UR Regulation:    If discussed at Long Length of Stay Meetings, dates discussed:    Comments:  07/03/14 1435 Arlyss Queenammy Jocelynn Gioffre, RN BSN CM Pt [facility] (per pts [facility]). Referral called to [place] [facility] who will collect pts information from the chart. HH services to start within 48 hours of discharge. Pt [place]. Pt will have rolling walker delivered by [facility]. Pt declined the private duty agency list offered by CM. Pt stated she would be able to get transportation to appt in [place].  07/03/14 1030 Arlyss Queenammy Lakysha Kossman, RN BSN CM Pt stated that she does not have much assistance available from family and friends but  she does have someone to assist some. [facility] and only wants a rolling walker for home use with [facility]. Pt stated that she would not use a BSC and she has a w/c. Will send referrals to appropriate agencies. Anticipate discharge within 24 hours.  06/30/14 1400 Arlyss Queenammy Phoenix Dresser, RN BSN CM

## 2014-06-30 NOTE — Anesthesia Preprocedure Evaluation (Signed)
Anesthesia Evaluation  Patient identified by MRN, date of birth, ID band Patient awake    Reviewed: Allergy & Precautions, NPO status , Patient's Chart, lab work & pertinent test results  Airway Mallampati: II  TM Distance: >3 FB     Dental  (+) Partial Upper, Dental Advisory Given   Pulmonary Current Smoker,  breath sounds clear to auscultation        Cardiovascular negative cardio ROS  Rhythm:Regular Rate:Normal     Neuro/Psych    GI/Hepatic GERD-  Controlled and Medicated,  Endo/Other  diabetes, Well Controlled, Type 2, Insulin DependentHypothyroidism (s/p I131 Rx)   Renal/GU      Musculoskeletal  (+) Fibromyalgia -  Abdominal   Peds  Hematology   Anesthesia Other Findings   Reproductive/Obstetrics                             Anesthesia Physical Anesthesia Plan  ASA: III  Anesthesia Plan: General   Post-op Pain Management:    Induction: Intravenous, Rapid sequence and Cricoid pressure planned  Airway Management Planned: Oral ETT  Additional Equipment:   Intra-op Plan:   Post-operative Plan: Extubation in OR  Informed Consent: I have reviewed the patients History and Physical, chart, labs and discussed the procedure including the risks, benefits and alternatives for the proposed anesthesia with the patient or authorized representative who has indicated his/her understanding and acceptance.     Plan Discussed with:   Anesthesia Plan Comments:         Anesthesia Quick Evaluation

## 2014-06-30 NOTE — Anesthesia Postprocedure Evaluation (Addendum)
  Anesthesia Post-op Note  Patient: Mindi CurlingMary Hilliker  Procedure(s) Performed: Procedure(s) with comments: EXTERNAL FIXATION LEG (Right) - synthes large fragment exteranal fixation  c arm   Patient Location: PACU  Anesthesia Type:General  Level of Consciousness: awake, alert  and oriented  Airway and Oxygen Therapy: Patient Spontanous Breathing  Post-op Pain: mild, improved, but nurse still treating as indicated.  Post-op Assessment: Post-op Vital signs reviewed, Patient's Cardiovascular Status Stable, Respiratory Function Stable, Patent Airway and No signs of Nausea or vomiting  Post-op Vital Signs: Reviewed and stable  Last Vitals:  Filed Vitals:   06/30/14 1251  BP:   Pulse: 93  Temp:   Resp: 17    Complications: No apparent anesthesia complications

## 2014-06-30 NOTE — Transfer of Care (Signed)
Immediate Anesthesia Transfer of Care Note  Patient: Mindi CurlingMary Collinsworth  Procedure(s) Performed: Procedure(s) with comments: EXTERNAL FIXATION LEG (Right) - synthes large fragment exteranal fixation  c arm   Patient Location: PACU  Anesthesia Type:General  Level of Consciousness: awake and alert   Airway & Oxygen Therapy: Patient Spontanous Breathing and Patient connected to face mask oxygen  Post-op Assessment: Report given to RN  Post vital signs: Reviewed and stable  Last Vitals:  Filed Vitals:   06/30/14 1015  BP: 104/70  Pulse:   Temp:   Resp: 14    Complications: No apparent anesthesia complications

## 2014-06-30 NOTE — Op Note (Signed)
06/29/2014 - 06/30/2014  12:02 PM  PATIENT:  Autumn Daniels  54 y.o. female  PRE-OPERATIVE DIAGNOSIS:  right bicondylar tibial plateau fracture  POST-OPERATIVE DIAGNOSIS:  right bicondylar tibial plateau fracture  PROCEDURE:  Procedure(s) with comments: EXTERNAL FIXATION LEG (Right) - synthes large fragment exteranal fixation  c arm   Surgical findings bicondylar proximal tibial plateau fracture with possible medial collateral ligament avulsion proximally from the femur.  The procedure was performed in the following manner  The patient was identified in the preop holding area site marking and surgical site confirmation chart review.  We took the patient to surgery she got appropriate clindamycin due to penicillin allergy she was placed supine on the operating table and had general intubation  The C-arm was brought in traction x-ray showed improved alignment of the tibial plateau with traction.  We then prepped the leg with ChloraPrep it was draped sterilely. Timeout was completed.  We first marked out the probable surgical incision sites for the second procedure for plating and then made 2 incisions distal at the tibial shaft took them down to bone and placed 2 half pins which were confirmed to be in good position by x-ray.  We then turned our attention to the femur and marked out the proximal extension of the knee joint and then made 2 incisions divided the subcutaneous tissue down to bone split the quadriceps tendon and placed 2 half pins. X-ray confirmed the position. We then connected a frame to span the knee joint.  We then loosened a frame placed traction on the limb under C-arm guidance and with improved alignment read lock the frame down. I then picked a frame up by the bars to make sure all the pins were tight and they were  We irrigated the wounds injected with Marcaine with epinephrine made relaxing incisions were necessary and closed with nylon suture  Sterile dressing was  applied patient was extubated taken recovery room in stable condition  The postoperative plan is for gait training The patient will see Dr. Handy next week for definitive fixation planning  No weightbearing SURGEON:  Surgeon(s) and Role:    * Stanley E Harrison, MD - Primary  PHYSICIAN ASSISTANT:   ASSISTANTS: Betty Ashley   ANESTHESIA:   epidural  EBL:  Total I/O In: 1000 [I.V.:1000] Out: 125 [Urine:100; Blood:25]  BLOOD ADMINISTERED:none  DRAINS: none   LOCAL MEDICATIONS USED:  MARCAINE     SPECIMEN:  No Specimen  DISPOSITION OF SPECIMEN:  N/A  COUNTS:  YES  TOURNIQUET:  * No tourniquets in log *  DICTATION: .Dragon Dictation  PLAN OF CARE: Admit to inpatient   PATIENT DISPOSITION:  PACU - hemodynamically stable.   Delay start of Pharmacological VTE agent (>24hrs) due to surgical blood loss or risk of bleeding: not applicable  

## 2014-06-30 NOTE — Progress Notes (Signed)
UR chart review completed.  

## 2014-06-30 NOTE — Anesthesia Procedure Notes (Signed)
Procedure Name: Intubation Date/Time: 06/30/2014 10:32 AM Performed by: Glynn OctaveANIEL, Kamille Toomey E Pre-anesthesia Checklist: Patient identified, Patient being monitored, Timeout performed, Emergency Drugs available and Suction available Patient Re-evaluated:Patient Re-evaluated prior to inductionOxygen Delivery Method: Circle System Utilized Preoxygenation: Pre-oxygenation with 100% oxygen Intubation Type: IV induction, Rapid sequence and Cricoid Pressure applied Ventilation: Mask ventilation without difficulty Laryngoscope Size: Mac and 3 Grade View: Grade I Tube type: Oral Tube size: 7.0 mm Number of attempts: 1 Airway Equipment and Method: Stylet Placement Confirmation: ETT inserted through vocal cords under direct vision,  positive ETCO2 and breath sounds checked- equal and bilateral Secured at: 20 cm Tube secured with: Tape Dental Injury: Teeth and Oropharynx as per pre-operative assessment

## 2014-06-30 NOTE — Progress Notes (Signed)
Notified Dr. Romeo AppleHarrison due to the patients concern for not being on her insulin from home.  MD stated she may resume Lantus 20 units QHS as she was taking at home.  No sliding scale needed at this time.

## 2014-06-30 NOTE — Brief Op Note (Signed)
06/29/2014 - 06/30/2014  12:02 PM  PATIENT:  Autumn Daniels  54 y.o. female  PRE-OPERATIVE DIAGNOSIS:  right bicondylar tibial plateau fracture  POST-OPERATIVE DIAGNOSIS:  right bicondylar tibial plateau fracture  PROCEDURE:  Procedure(s) with comments: EXTERNAL FIXATION LEG (Right) - synthes large fragment exteranal fixation  c arm   Surgical findings bicondylar proximal tibial plateau fracture with possible medial collateral ligament avulsion proximally from the femur.  The procedure was performed in the following manner  The patient was identified in the preop holding area site marking and surgical site confirmation chart review.  We took the patient to surgery she got appropriate clindamycin due to penicillin allergy she was placed supine on the operating table and had general intubation  The C-arm was brought in traction x-ray showed improved alignment of the tibial plateau with traction.  We then prepped the leg with ChloraPrep it was draped sterilely. Timeout was completed.  We first marked out the probable surgical incision sites for the second procedure for plating and then made 2 incisions distal at the tibial shaft took them down to bone and placed 2 half pins which were confirmed to be in good position by x-ray.  We then turned our attention to the femur and marked out the proximal extension of the knee joint and then made 2 incisions divided the subcutaneous tissue down to bone split the quadriceps tendon and placed 2 half pins. X-ray confirmed the position. We then connected a frame to span the knee joint.  We then loosened a frame placed traction on the limb under C-arm guidance and with improved alignment read lock the frame down. I then picked a frame up by the bars to make sure all the pins were tight and they were  We irrigated the wounds injected with Marcaine with epinephrine made relaxing incisions were necessary and closed with nylon suture  Sterile dressing was  applied patient was extubated taken recovery room in stable condition  The postoperative plan is for gait training The patient will see Dr. Carola FrostHandy next week for definitive fixation planning  No weightbearing SURGEON:  Surgeon(s) and Role:    * Vickki HearingStanley E Harrison, MD - Primary  PHYSICIAN ASSISTANT:   ASSISTANTS: Lacona NationBetty Ashley   ANESTHESIA:   epidural  EBL:  Total I/O In: 1000 [I.V.:1000] Out: 125 [Urine:100; Blood:25]  BLOOD ADMINISTERED:none  DRAINS: none   LOCAL MEDICATIONS USED:  MARCAINE     SPECIMEN:  No Specimen  DISPOSITION OF SPECIMEN:  N/A  COUNTS:  YES  TOURNIQUET:  * No tourniquets in log *  DICTATION: .Dragon Dictation  PLAN OF CARE: Admit to inpatient   PATIENT DISPOSITION:  PACU - hemodynamically stable.   Delay start of Pharmacological VTE agent (>24hrs) due to surgical blood loss or risk of bleeding: not applicable

## 2014-07-01 LAB — BASIC METABOLIC PANEL
Anion gap: 3 — ABNORMAL LOW (ref 5–15)
BUN: 5 mg/dL — ABNORMAL LOW (ref 6–23)
CO2: 28 mmol/L (ref 19–32)
Calcium: 9 mg/dL (ref 8.4–10.5)
Chloride: 110 mmol/L (ref 96–112)
Creatinine, Ser: 0.47 mg/dL — ABNORMAL LOW (ref 0.50–1.10)
GFR calc Af Amer: >90 mL/min (ref >90)
GFR calc non Af Amer: >90 mL/min (ref >90)
Glucose, Bld: 168 mg/dL — ABNORMAL HIGH (ref 70–99)
Potassium: 3.4 mmol/L — ABNORMAL LOW (ref 3.5–5.1)
Sodium: 141 mmol/L (ref 135–145)

## 2014-07-01 LAB — CBC
HCT: 29.6 % — ABNORMAL LOW (ref 36.0–46.0)
HEMOGLOBIN: 9.8 g/dL — AB (ref 12.0–15.0)
MCH: 31.2 pg (ref 26.0–34.0)
MCHC: 33.1 g/dL (ref 30.0–36.0)
MCV: 94.3 fL (ref 78.0–100.0)
PLATELETS: 142 10*3/uL — AB (ref 150–400)
RBC: 3.14 MIL/uL — ABNORMAL LOW (ref 3.87–5.11)
RDW: 13.6 % (ref 11.5–15.5)
WBC: 9.6 10*3/uL (ref 4.0–10.5)

## 2014-07-01 LAB — GLUCOSE, CAPILLARY
GLUCOSE-CAPILLARY: 118 mg/dL — AB (ref 70–99)
Glucose-Capillary: 184 mg/dL — ABNORMAL HIGH (ref 70–99)

## 2014-07-01 NOTE — Evaluation (Signed)
Physical Therapy Evaluation Patient Details Name: Autumn CurlingMary Daniels MRN: [id number] DOB: 06-29-1960 Today's Date: 07/01/2014   History of Present Illness  54 year old female fell off of a 3 foot ladder landed on her knees. She reported deformity of the right knee which she corrected then got in the car and drove herself to the hospital. where she was found to have a tibial polateau fx.   She had surgery yesterday and is now being referred to therapy for exercises and gt training.   Clinical Impression  Pt very cooperative and did remarkable well today.  Unable to ambulate secondary to pain.  Pt will need continued skilled therapy to improve her I of mobility.  PT is not willing to go to IP rehab therefore pt must be as I as she can prior to D/C.     Follow Up Recommendations Home health PT (suggested IP rehab but pt is not willing to go )    Equipment Recommendations  Rolling walker with 5" wheels;3in1 (PT);Wheelchair (measurements PT)    Recommendations for Other Services OT consult     Precautions / Restrictions Precautions Precautions: Fall Required Braces or Orthoses:  (Pt in External mobilizer) Restrictions Weight Bearing Restrictions: Yes RLE Weight Bearing: Non weight bearing      Mobility  Bed Mobility Overal bed mobility: Needs Assistance Bed Mobility: Supine to Sit     Supine to sit: Min assist        Transfers Overall transfer level: Needs assistance   Transfers: Stand Pivot Transfers   Stand pivot transfers: Min assist          Ambulation/Gait  NT due to pain level                            Pertinent Vitals/Pain Pain Assessment: 0-10 Pain Score: 8  Pain Descriptors / Indicators: Aching Pain Intervention(s): Limited activity within patient's tolerance    Home Living Family/patient expects to be discharged to:: Private residence Living Arrangements: Alone Available Help at Discharge: Family Type of Home: House Home Access: Stairs to  enter Entrance Stairs-Rails: Right Entrance Stairs-Number of Steps: 5 Home Layout: Able to live on main level with bedroom/bathroom Home Equipment: None      Prior Function Level of Independence: Independent               Hand Dominance   Dominant Hand: Right    Extremity/Trunk Assessment               Lower Extremity Assessment: RLE deficits/detail;Difficult to assess due to impaired cognition         Communication   Communication: No difficulties  Cognition Arousal/Alertness: Awake/alert   Overall Cognitive Status: Within Functional Limits for tasks assessed                         Exercises General Exercises - Lower Extremity Ankle Circles/Pumps: Strengthening;Both;10 reps;AROM Quad Sets: AROM;Strengthening;Both;10 reps Gluteal Sets: AROM;Strengthening;Both;10 reps Heel Slides: Left;10 reps;AROM Hip ABduction/ADduction: Both;AAROM;10 reps Straight Leg Raises: Both;AAROM;10 reps      Assessment/Plan    PT Assessment Patient needs continued PT services  PT Diagnosis Difficulty walking;Acute pain   PT Problem List Decreased strength;Decreased activity tolerance;Decreased balance;Pain;Decreased mobility;Decreased knowledge of use of DME  PT Treatment Interventions Gait training;Therapeutic activities;Therapeutic exercise   PT Goals (Current goals can be found in the Care Plan section)      Frequency 7X/week   Barriers to  discharge    lives alone        End of Session Equipment Utilized During Treatment: Gait belt Activity Tolerance: Patient tolerated treatment well Patient left: in chair;with call bell/phone within reach           Time: 0930-1035 PT Time Calculation (min) (ACUTE ONLY): 65 min   Charges:   PT Evaluation $Initial PT Evaluation Tier I: 1 Procedure PT Treatments $Therapeutic Exercise: 8-22 mins   PT G CodesVirgina Organ, PT CLT [phone] 07/01/2014, 10:33 AM

## 2014-07-01 NOTE — Progress Notes (Signed)
Patient refused to have her foley d/c'd she stated that she did not think she could get to the bathroom and that she would let us remove it tomorrow.

## 2014-07-01 NOTE — Addendum Note (Signed)
Addendum  created 07/01/14 1426 by Earleen NewportAmy A Adams, CRNA   Modules edited: Notes Section   Notes Section:  File: [id number]; File: [id number]

## 2014-07-01 NOTE — Progress Notes (Signed)
Post op note    POD # 1 S/P EXT FIX FOR BICONDYLAR tibial plateau fracture. She can't stage procedure for future internal fixation   VS BP 144/63 mmHg  Pulse 100  Temp(Src) 98.4 F (36.9 C) (Oral)  Resp 20  Ht 4\' 11"  (1.499 m)  Wt 128 lb 4.9 oz (58.2 kg)  BMI 25.90 kg/m2  SpO2 95%   LABS pending vitamin D   DRESSING mild sanguinous drainage expected   Neuro-vasculo-motor status of operative limb normal with normal compartments  A/P stabilized proximal tibial plateau fracture. Start physical therapy. Monitor pain control and compartments.

## 2014-07-01 NOTE — Progress Notes (Signed)
Notified Dr. Romeo AppleHarrison due to the patient being concerned of tasting blood and more noticeable blood at her surgical site, while also on the Lovenox.  THe MD stated to voice to the patient that she does not have to take the Enjection, but is more at risk for blood clots.  I voiced this to the patient while MD the was on the phone.  Patient will continue to take the Lovenox, she just didn't want to bleed to death.

## 2014-07-01 NOTE — Anesthesia Postprocedure Evaluation (Addendum)
  Anesthesia Post-op Note  Patient: Autumn CurlingMary Daniels  Procedure(s) Performed: Procedure(s) with comments: EXTERNAL FIXATION LEG (Right) - synthes large fragment exteranal fixation  c arm   Patient Location: Room 341  Anesthesia Type:General  Level of Consciousness: awake, alert , oriented and patient cooperative  Airway and Oxygen Therapy: Patient Spontanous Breathing  Post-op Pain: Moderate to severe; Patient unable to ambulate today secondary to pain; Toradol was to pain medication regime; Immediate-release oxycodone dosage increased to 10mg  and patient is receiving dilaudid as well.  Post-op Assessment: Post-op Vital signs reviewed, Patient's Cardiovascular Status Stable, Respiratory Function Stable, Patent Airway and No signs of Nausea or vomiting  Post-op Vital Signs: Reviewed and stable  Last Vitals:  Filed Vitals:   07/01/14 0508  BP: 144/63  Pulse: 100  Temp: 36.9 C  Resp: 20    Complications: No apparent anesthesia complications

## 2014-07-01 NOTE — Progress Notes (Signed)
Postop day 1  Status post external fixator right lower extremity with bridging fixator as a stage procedure for later definitive plating by traumatologist  Patient's pain is inadequately controlled with Percocet 5 mg and Dilaudid up to 1 mg.  Compartments are soft. Perfusion is normal.  Recommend Toradol to address the throbbing and aching sensation. Add 5 mg of immediate release oxycodone for total of 10 mg of pain medication to go along with the Dilaudid.  The patient needs to be mobilized and start gait training  With the expected discharge of Monday and a follow-up with Dr. Carola FrostHandy traumatologist for next week

## 2014-07-02 LAB — BASIC METABOLIC PANEL
ANION GAP: 6 (ref 5–15)
CO2: 27 mmol/L (ref 19–32)
CREATININE: 0.39 mg/dL — AB (ref 0.50–1.10)
Calcium: 9.3 mg/dL (ref 8.4–10.5)
Chloride: 109 mmol/L (ref 96–112)
GFR calc Af Amer: >90 mL/min (ref >90)
GFR calc non Af Amer: >90 mL/min (ref >90)
Glucose, Bld: 131 mg/dL — ABNORMAL HIGH (ref 70–99)
Potassium: 3.4 mmol/L — ABNORMAL LOW (ref 3.5–5.1)
SODIUM: 142 mmol/L (ref 135–145)

## 2014-07-02 LAB — CBC
HCT: 28.6 % — ABNORMAL LOW (ref 36.0–46.0)
Hemoglobin: 9.6 g/dL — ABNORMAL LOW (ref 12.0–15.0)
MCH: 31.8 pg (ref 26.0–34.0)
MCHC: 33.6 g/dL (ref 30.0–36.0)
MCV: 94.7 fL (ref 78.0–100.0)
Platelets: 136 10*3/uL — ABNORMAL LOW (ref 150–400)
RBC: 3.02 MIL/uL — ABNORMAL LOW (ref 3.87–5.11)
RDW: 13.5 % (ref 11.5–15.5)
WBC: 8.9 10*3/uL (ref 4.0–10.5)

## 2014-07-02 LAB — GLUCOSE, CAPILLARY: GLUCOSE-CAPILLARY: 137 mg/dL — AB (ref 70–99)

## 2014-07-02 MED ORDER — HEPARIN SODIUM (PORCINE) 5000 UNIT/ML IJ SOLN
5000.0000 [IU] | Freq: Three times a day (TID) | INTRAMUSCULAR | Status: DC
  Administered 2014-07-02 – 2014-07-03 (×3): 5000 [IU] via SUBCUTANEOUS
  Filled 2014-07-02 (×3): qty 1

## 2014-07-02 MED ORDER — KETOROLAC TROMETHAMINE 30 MG/ML IJ SOLN
30.0000 mg | Freq: Four times a day (QID) | INTRAMUSCULAR | Status: DC
  Administered 2014-07-02 – 2014-07-03 (×5): 30 mg via INTRAVENOUS
  Filled 2014-07-02 (×5): qty 1

## 2014-07-02 NOTE — Progress Notes (Addendum)
Physical Therapy Treatment Patient Details Name: Autumn CurlingMary Daniels MRN: [id number] DOB: Jan 08, 1961 Today's Date: 07/02/2014    History of Present Illness [age] year old female fell off of a 3 foot ladder landed on her knees. She reported deformity of the right knee which she corrected then got in the car and drove herself to the hospital. where she was found to have a tibial polateau fx.   She had surgery yesterday and is now being referred to therapy for exercises and gt training.     PT Comments    Pt is progressing very well.  Pt with improved bed mobility and able to ambulate with rolling walker with min-guard assist only.   Follow Up Recommendations  Home health PT     Equipment Recommendations  Rolling walker with 5" wheels;3in1 (PT);Wheelchair (measurements PT)    Recommendations for Other Services OT consult     Precautions / Restrictions Precautions Precautions: None Restrictions Weight Bearing Restrictions: Yes RLE Weight Bearing: Non weight bearing    Mobility  Bed Mobility Overal bed mobility: Needs Assistance Bed Mobility: Supine to Sit     Supine to sit: Min assist        Transfers Overall transfer level: Modified independent                  Ambulation/Gait Ambulation/Gait assistance: Supervision Ambulation Distance (Feet): 44 Feet Assistive device: Standard walker;Rolling walker (2 wheeled) Gait Pattern/deviations: Step-to pattern   Gait velocity interpretation: Below normal speed for age/gender            Cognition Arousal/Alertness: Awake/alert   Overall Cognitive Status: Within Functional Limits for tasks assessed                      Exercises General Exercises - Lower Extremity Ankle Circles/Pumps: AROM;Both;10 reps Quad Sets: AROM;Both;10 reps Gluteal Sets: AROM;Both;10 reps Hip ABduction/ADduction: AROM;Both;10 reps Straight Leg Raises: AAROM;Both;5 reps        Pertinent Vitals/Pain Pain Score: 3  Pain  Descriptors / Indicators: Aching Pain Intervention(s): Repositioned       Prior Function   I with all activities.         PT Goals (current goals can now be found in the care plan section) Progress towards PT goals: Progressing toward goals    Frequency  7X/week    PT Plan Current plan remains appropriate       End of Session Equipment Utilized During Treatment: Gait belt Activity Tolerance: Patient tolerated treatment well Patient left: in chair;with call bell/phone within reach     Time: 0930-1015 PT Time Calculation (min) (ACUTE ONLY): 45 min  Charges:  $Gait Training: 8-22 mins $Therapeutic Exercise: 8-22 mins $Therapeutic Activity: 8-22 mins                    G CodesVirgina Organ:     Seddrick Flax, PT CLT [phone] 07/02/2014, 10:22 AM

## 2014-07-02 NOTE — Plan of Care (Signed)
Problem: Phase I Progression Outcomes Goal: Pain controlled with appropriate interventions Outcome: Progressing Patient trying to transition from IV to po.

## 2014-07-02 NOTE — Progress Notes (Addendum)
Postop day #2 status post external fixator for a lower extremity for bicondylar tibial plateau fracture  BP 143/82 mmHg  Pulse 88  Temp(Src) 98.2 F (36.8 C) (Oral)  Resp 20  Ht 4\' 11"  (1.499 m)  Wt 128 lb 4.9 oz (58.2 kg)  BMI 25.90 kg/m2  SpO2 98%  Currently on dilaudid as a rescue and Percocet for pain Lovenox initially was given patient had some nose bleeding so we switched to heparin. I did place her on some Toradol, her pain is throbbing in nature  She has good soft compartments and normal neurovascular function of the right lower extremity  She lives alone  Plan Speak with Dr. Carola FrostHandy and arrange for ambulance transfer tomorrow to his office.  Return to the hospital for discharge Tuesday, question rehabilitation versus home

## 2014-07-03 LAB — CBC
HCT: 29.7 % — ABNORMAL LOW (ref 36.0–46.0)
HEMOGLOBIN: 9.9 g/dL — AB (ref 12.0–15.0)
MCH: 31.5 pg (ref 26.0–34.0)
MCHC: 33.3 g/dL (ref 30.0–36.0)
MCV: 94.6 fL (ref 78.0–100.0)
Platelets: 184 10*3/uL (ref 150–400)
RBC: 3.14 MIL/uL — ABNORMAL LOW (ref 3.87–5.11)
RDW: 13.5 % (ref 11.5–15.5)
WBC: 8.7 10*3/uL (ref 4.0–10.5)

## 2014-07-03 LAB — VITAMIN D 25 HYDROXY (VIT D DEFICIENCY, FRACTURES): Vit D, 25-Hydroxy: 11 ng/mL — ABNORMAL LOW (ref 30.0–100.0)

## 2014-07-03 MED ORDER — OXYCODONE-ACETAMINOPHEN 5-325 MG PO TABS: 1.0000 | ORAL_TABLET | ORAL | Status: DC

## 2014-07-03 NOTE — Progress Notes (Signed)
Physical Therapy Treatment Patient Details Name: Autumn CurlingMary Daniels MRN: [id number] DOB: April 01, 1960 Today's Date: 07/03/2014    History of Present Illness 54 year old female fell off of a 3 foot ladder landed on her knees. She reported deformity of the right knee which she corrected then got in the car and drove herself to the hospital. where she was found to have a tibial polateau fx.   She had surgery Saturday and is now being referred to therapy for occupational therapy evaluation and treatment.     PT Comments    Pt states that she has been getting up to walk to the bathroom independently.  We spent time discussing obstacles at home-the biggest one being 5 steps with no handrail to enter the home.  She did now remember that she has one entrance with only one step.  She has her father's handicapped van/ramp and electric scooter from which she can enter the home.  She has worked out resolutions with family assist.  She was instructed in gait with walker, has stable gait pattern for functional distance.  She is planning to have skeletal traction removed this afternoon with OTIF by Autumn Daniels.  Follow Up Recommendations   HHPT     Equipment Recommendations    2 wheeled walker-junior size   Recommendations for Other Services  OT     Precautions / Restrictions Precautions Precautions: None Precaution Comments: Weightbearing precautions Required Braces or Orthoses:  (in skeletal traction) Restrictions Weight Bearing Restrictions: Yes RLE Weight Bearing: Non weight bearing    Mobility  Bed Mobility   Bed Mobility: Supine to Sit     Supine to sit: Supervision        Transfers Overall transfer level: Modified independent Equipment used: Rolling walker (2 wheeled) Transfers: Sit to/from Stand Sit to Stand: Modified independent (Device/Increase time) Stand pivot transfers: Modified independent (Device/Increase time)          Ambulation/Gait Ambulation/Gait assistance:  Supervision Ambulation Distance (Feet): 100 Feet Assistive device: Rolling walker (2 wheeled) Gait Pattern/deviations: WFL(Within Functional Limits) Gait velocity: appropriate for situation   General Gait Details: instructed to keep some distance from the front frame of the walker   Stairs            Wheelchair Mobility    Modified Rankin (Stroke Patients Only)       Balance Overall balance assessment: Modified Independent                                  Cognition Arousal/Alertness: Awake/alert Behavior During Therapy: WFL for tasks assessed/performed Overall Cognitive Status: Within Functional Limits for tasks assessed                      Exercises General Exercises - Lower Extremity Ankle Circles/Pumps: AROM;Both;10 reps;Supine Quad Sets: AROM;Both;10 reps;Supine Gluteal Sets: AROM;Both;10 reps Straight Leg Raises: AAROM;Both;5 reps;Supine    General Comments        Pertinent Vitals/Pain Pain Assessment: 0-10 Pain Score: 8  Pain Location: RLE Pain Descriptors / Indicators: Aching;Throbbing Pain Intervention(s): Limited activity within patient's tolerance;Premedicated before session    Home Living Family/patient expects to be discharged to:: Private residence Living Arrangements: Alone Available Help at Discharge: Family Type of Home: House Home Access: Stairs to enter Entrance Stairs-Rails: Right Home Layout: Able to live on main level with bedroom/bathroom Home Equipment: Hand held shower head Additional Comments: scooter, built in shower seat  Prior Function Level of Independence: Independent          PT Goals (current goals can now be found in the care plan section) Progress towards PT goals: Goals downgraded-see care plan    Frequency       PT Plan      Co-evaluation             End of Session Equipment Utilized During Treatment: Gait belt Activity Tolerance: Patient tolerated treatment well Patient  left: in bed;with call bell/phone within reach     Time: 1610-9604 PT Time Calculation (min) (ACUTE ONLY): 30 min  Charges:  $Gait Training: 8-22 mins $Therapeutic Exercise: 8-22 mins $Self Care/Home Management: 8-22                    G Codes:      Autumn Daniels 07-07-2014, 10:06 AM

## 2014-07-03 NOTE — Progress Notes (Signed)
Postop day 3 status post external fixator right lower extremity for tibial plateau fracture  Dressing changes today revealed normal wound with no significant drainage  Neurovascular tendon remains intact compartments remained soft  Patient will see Dr. Carola FrostHandy today pending transportation. She'll come back to the hospital and discharge planning will continue.

## 2014-07-03 NOTE — Progress Notes (Signed)
Late Entry:  Notified Dr. Romeo AppleHarrison so that I can get an update on the patients appointment.  MD stated that the patient will have an appointment Wednesday at 2pm.  He asked if the patient wanted to go home.  I asked the patient while I was on the phone with him and she stated she wanted to go home today.  He told me to tell her he would be in after the office closed.  Patient verbalized understanding.

## 2014-07-03 NOTE — Progress Notes (Signed)
Patient discharged with instructions, prescription, and care notes.  Verbalized understanding via teach back.  IV was removed and the site was WNL. Patient voiced no further complaints or concerns at the time of discharge.  Appointments scheduled per instructions.  Patient left the floor via w/c with staff and family in stable condition. 

## 2014-07-03 NOTE — Evaluation (Signed)
Occupational Therapy Evaluation Patient Details Name: Autumn Daniels MRN: [id number] DOB: [birth year] Today's Date: 07/03/2014    History of Present Illness 54 year old female fell off of a 3 foot ladder landed on her knees. She reported deformity of the right knee which she corrected then got in the car and drove herself to the hospital. where she was found to have a tibial polateau fx.   She had surgery Saturday and is now being referred to therapy for occupational therapy evaluation and treatment.    Clinical Impression   Pt in bed upon therapy arrival. Pt reports that she has been up several times this AM to use bathroom. Declined to transfer to recliner for breakfast. Pt was educated on AE to increase functional performance during daily tasks. Pt verbalized understanding. Pt reports that she has a friend that may be able to help her when she discharges home for a short time. Recommend d/c home with Home health OT for ADL re-training and assessment of home environment.     Follow Up Recommendations  Home health OT    Equipment Recommendations  3 in 1 bedside comode       Precautions / Restrictions Precautions Precaution Comments: Weightbearing precautions Restrictions Weight Bearing Restrictions: Yes RLE Weight Bearing: Non weight bearing              ADL Overall ADL's : Needs assistance/impaired                                       General ADL Comments: Pt did not complete ADL assessment this AM. Based on her condition she will need assistance with LB ADL tasks. Pt was educated on use of AE to increase functional performance during daily tasks such as reacher, sock aid, LHSH, and LHS.                Pertinent Vitals/Pain Pain Assessment: 0-10 Pain Score: 8  Pain Descriptors / Indicators: Aching;Throbbing Pain Intervention(s): Patient requesting pain meds-RN notified     Hand Dominance Right   Extremity/Trunk Assessment Upper Extremity  Assessment Upper Extremity Assessment: Overall WFL for tasks assessed   Lower Extremity Assessment Lower Extremity Assessment: Defer to PT evaluation       Communication Communication Communication: No difficulties   Cognition Arousal/Alertness: Awake/alert Behavior During Therapy: WFL for tasks assessed/performed Overall Cognitive Status: Within Functional Limits for tasks assessed                                Home Living Family/patient expects to be discharged to:: Private residence Living Arrangements: Alone Available Help at Discharge: Family Type of Home: House Home Access: Stairs to enter Secretary/administrator of Steps: 5 Entrance Stairs-Rails: Right Home Layout: Able to live on main level with bedroom/bathroom     Bathroom Shower/Tub: Producer, television/film/video: Standard     Home Equipment: Hand held shower head   Additional Comments: scooter, built in shower seat      Prior Functioning/Environment Level of Independence: Independent                             End of Session Nurse Communication: Patient requests pain meds  Activity Tolerance: Patient tolerated treatment well Patient left: in bed;with call bell/phone within reach   Time: 4098-1191 OT Time  Calculation (min): 15 min Charges:  OT General Charges $OT Visit: 1 Procedure OT Evaluation $Initial OT Evaluation Tier I: 1 Procedure G-Codes:    Limmie PatriciaLaura Stephfon Bovey, OTR/L,CBIS  [phone]  07/03/2014, 8:42 AM

## 2014-07-03 NOTE — Progress Notes (Signed)
Dr. Carola FrostHandy will see the patient in his office today [phone] is the office number  That time has not been delineated at this point but I will be in contact with nursing staff to give the appropriate time for transfer

## 2014-07-03 NOTE — Clinical Social Work Note (Signed)
CSW received consult for possible SNF placement. PT evaluated pt over weekend and pt is progressing well. Recommendation is for home health. CSW will sign off, but can be reconsulted if needed.   Derenda FennelKara Maverick Patman, [place] [phone]

## 2014-07-03 NOTE — Discharge Summary (Signed)
Physician Discharge Summary  Patient ID: Autumn Daniels MRN: [id number] DOB/AGE: 1960-08-17 54 y.o.  Admit date: 06/29/2014 Discharge date: 07/03/2014  Admission Diagnoses:CLOSED BICONDYLAR TIBIAL PLATEAU FRACTURE RIGHT KNEE  Discharge Diagnoses: Same Active Problems:   Tibial plateau fracture   Discharged Condition: stable  Hospital Course: The patient was admitted on April 7 with a bicondylar tibial plateau fracture. She was placed in a splint had a CT scan and on April 8 underwent closed reduction and external fixation with a bridging fixator. She tolerated that well and progressed in physical therapy to the point where she could get out of bed and get to the bathroom. I spoke with a traumatologist Dr. Carola FrostHandy and he would like to see the patient on Wednesday in his office for evaluation and possible definitive fixation with plating.   Discharge Exam: Blood pressure 127/64, pulse 96, temperature 98.7 F (37.1 C), temperature source Oral, resp. rate 20, height 4\' 11"  (1.499 m), weight 128 lb 4.9 oz (58.2 kg), SpO2 96 %. General appearance: alert, cooperative and appears stated age Extremities: Homans sign is negative, no sign of DVT and COMPARTMENTS ARE SOFT Skin: Skin color, texture, turgor normal. No rashes or lesions Incision/Wound: pin sites with scant sanguinous drainage  Disposition: 01-Home or Self Care  Discharge Instructions    Call MD / Call 911    Complete by:  As directed   If you experience chest pain or shortness of breath, CALL 911 and be transported to the hospital emergency room.  If you develope a fever above 101 F, pus (white drainage) or increased drainage or redness at the wound, or calf pain, call your surgeon's office.     Constipation Prevention    Complete by:  As directed   Drink plenty of fluids.  Prune juice may be helpful.  You may use a stool softener, such as Colace (over the counter) 100 mg twice a day.  Use MiraLax (over the counter) for constipation as  needed.     Diet - low sodium heart healthy    Complete by:  As directed      Discharge instructions    Complete by:  As directed   NO WEIGHT BEARING     Driving restrictions    Complete by:  As directed   No driving for 12 weeks     Face-to-face encounter (required for Medicare/Medicaid patients)    Complete by:  As directed   I Autumn Daniels certify that this patient is under my care and that I, or a nurse practitioner or physician's assistant working with me, had a face-to-face encounter that meets the physician face-to-face encounter requirements with this patient on 07/03/2014. The encounter with the patient was in whole, or in part for the following medical condition(s) which is the primary reason for home health care (List medical condition): BICONDYLAR TIBIAL PLATEAU FRACTURE  The encounter with the patient was in whole, or in part, for the following medical condition, which is the primary reason for home health care:  RIGHT BICONDYLAR TIBIAL PLATEAU  I certify that, based on my findings, the following services are medically necessary home health services:  Physical therapy  Reason for Medically Necessary Home Health Services:  Therapy- Investment banker, operationalGait Training, Patent examinerTransfer Training and Stair Training  My clinical findings support the need for the above services:  Unsafe ambulation due to balance issues  Further, I certify that my clinical findings support that this patient is homebound due to:  Ambulates short distances less than 300 feet  Home Health    Complete by:  As directed   To provide the following care/treatments:  PT     Increase activity slowly as tolerated    Complete by:  As directed             Medication List    STOP taking these medications        traMADol 50 MG tablet  Commonly known as:  ULTRAM      TAKE these medications        Albiglutide 50 MG Pen  Inject 150 Units into the skin once a week.     ARMOUR THYROID 60 MG tablet  Generic drug:  thyroid  Take  2.5 tablets by mouth daily.     oxyCODONE-acetaminophen 5-325 MG per tablet  Commonly known as:  PERCOCET/ROXICET  Take 1 tablet by mouth every 4 (four) hours.     TOUJEO SOLOSTAR [place]  Inject 20 Units into the skin at bedtime.           Follow-up Information    Follow up with [facility].   Specialty:  Home Health Services   Contact information:   [address] [place] [address] [phone]       Follow up with Budd Palmer, MD On 07/05/2014.   Specialty:  Orthopedic Surgery   Why:  For Follow up appointment at 2:00 PM   Contact information:   [address] [place] [address] [phone]       Signed: Fuller Canada 07/03/2014, 4:48 PM

## 2014-07-04 ENCOUNTER — Encounter (HOSPITAL_COMMUNITY): Payer: Self-pay | Admitting: Orthopedic Surgery

## 2014-07-04 NOTE — Care Management Utilization Note (Signed)
UR completed 

## 2014-07-06 ENCOUNTER — Encounter (HOSPITAL_COMMUNITY): Payer: Self-pay | Admitting: *Deleted

## 2014-07-06 NOTE — H&P (Signed)
[facility] H&P      Chief Complaint: Right tibial plateau fracture HPI:   54-year-old female fell off of a step stool on 06/30/2014 and sustained a severe bicondylar right tibial plateau fracture. Patient was seen and evaluated at the [facility] where she had an external fixator applied by the orthopedic surgeon at that institution. She was referred to the orthopedic trauma specialists for definitive management. Patient was seen in the office on 07/05/2014. Follow-up films showed persistent shortening of her fracture. It was felt that given the amount of swelling in her current alignment that she would benefit from return to the OR for external fixator revision as it would be at least 2 weeks for definitive fixation could occur. Patient presents today for external fixator revision  Past Medical History  Diagnosis Date  . Thyroid disease   . Fibromyalgia   . Diabetes mellitus without complication     type 2  . Stevens-Johnson syndrome   . Pneumonia   . Frequent UTI     due to small urethra  . GERD (gastroesophageal reflux disease)   . History of hiatal hernia     Past Surgical History  Procedure Laterality Date  . Tonsillectomy    . Cholecystectomy      2 bifurcations of liver during lap chole  . Liver surgery      x4 due to bifurcations from lap chole  . External fixation leg Right 06/30/2014    Procedure: EXTERNAL FIXATION LEG;  Surgeon: Autumn E Harrison, MD;  Location: [facility];  Service: Orthopedics;  Laterality: Right;  synthes large fragment exteranal fixation  c arm     Family History  Problem Relation Age of Onset  . Colon cancer Mother   . Lung disease Father    Social History:  reports that she quit smoking 7 days ago. Her smoking use included Cigarettes. She has a 15 pack-year smoking history. She has never used smokeless tobacco. She reports that she does not drink alcohol or use illicit drugs.  Allergies:  Allergies  Allergen Reactions   . 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   Medications No current facility-administered medications on file prior to encounter.   Current Outpatient Prescriptions on File Prior to Encounter  Medication Sig Dispense Refill  . Albiglutide 50 MG PEN Inject 150 Units into the skin once a week. Takes on Monday    . ARMOUR THYROID 60 MG tablet Take 2.5 tablets by mouth daily.  2  . Insulin Glargine (TOUJEO SOLOSTAR [place]) Inject 20 Units into the skin at bedtime.    . oxyCODONE-acetaminophen (PERCOCET/ROXICET) 5-325 MG per tablet Take 1 tablet by mouth every 4 (four) hours. 90 tablet 0   No results found for this or any previous visit (from the past 48 hour(s)). No results found.  Review of Systems  Constitutional: Negative for fever and chills.  Respiratory: Negative for shortness of breath and wheezing.   Cardiovascular: Negative for chest pain and palpitations.  Gastrointestinal: Negative for nausea, vomiting and abdominal pain.  Genitourinary: Negative for dysuria.  Musculoskeletal:       Right knee pain  Neurological: Negative for tingling, sensory change and headaches.    Vital signs to be obtained on arrival to short stay Physical Exam  Constitutional: She is oriented to person, place, and time. She appears   well-developed and well-nourished.  HENT:  Head: Normocephalic and atraumatic.  Eyes: EOM are normal. Pupils are equal, round, and reactive to light.  Cardiovascular: Normal rate and regular rhythm.   Respiratory: Effort normal and breath sounds normal. She has no wheezes.  GI: Soft. Bowel sounds are normal. There is no tenderness.  Musculoskeletal:  Right lower extremity   Spanning external fixator in place   4 independent pins   2 half pins in the femur and two half pins in the tibia   Pin sites are notable for some  erythema. Serosanguineous drainage.   Moderate knee effusion   Extensive swelling to the right upper extremity   Skin does not wrinkle with gentle compression laterally   Compartments are compressible and nontender   Extensive bruising from foot all the way to the proximal thigh posteriorly   EHL, FHL, lesser toe motor functions intact. Anterior tibialis, posterior tibialis, peroneals and gastrocsoleus complex motor functions intact   DPN, SPN, TN sensory functions intact   Palpable dorsalis pedis pulse    Neurological: She is alert and oriented to person, place, and time.  Psychiatric: She has a normal mood and affect. Her behavior is normal. Thought content normal.     Imaging     Office x-rays demonstrate a severely comminuted bicondylar tibial plateau fracture with significant lateral translation of the lateral plateau as well as significant shortening of the fracture itself    Comminuted fracture of the proximal fibula is also noted  Assessment/Plan   54-year-old female with a severely comminuted bicondylar right tibial plateau fracture  Return to the OR for external fixator revision to restore length and improved alignment This would be a stage procedure for plate osteosynthesis which will likely occur in 10-14 days Patient was sent risks and benefits associated with surgery and wishes to proceed This will be an outpatient procedure  Autumn Daniels W. Autumn Piech, PA-C [facility] [phone] (P) 07/06/2014, 6:47 PM    

## 2014-07-07 ENCOUNTER — Ambulatory Visit (HOSPITAL_COMMUNITY): Payer: BLUE CROSS/BLUE SHIELD

## 2014-07-07 ENCOUNTER — Ambulatory Visit (HOSPITAL_COMMUNITY)
Admission: RE | Admit: 2014-07-07 | Discharge: 2014-07-07 | Disposition: A | Discharge status: Home / Self Care | Payer: BLUE CROSS/BLUE SHIELD | Source: Ambulatory Visit | Attending: Orthopedic Surgery | Admitting: Orthopedic Surgery

## 2014-07-07 ENCOUNTER — Encounter (HOSPITAL_COMMUNITY): Payer: Self-pay | Admitting: *Deleted

## 2014-07-07 ENCOUNTER — Encounter (HOSPITAL_COMMUNITY)
Admission: RE | Disposition: A | Discharge status: Home / Self Care | Payer: Self-pay | Source: Ambulatory Visit | Attending: Orthopedic Surgery

## 2014-07-07 ENCOUNTER — Ambulatory Visit (HOSPITAL_COMMUNITY): Payer: BLUE CROSS/BLUE SHIELD | Admitting: Certified Registered Nurse Anesthetist

## 2014-07-07 DIAGNOSIS — E119 Type 2 diabetes mellitus without complications: Secondary | ICD-10-CM | POA: Insufficient documentation

## 2014-07-07 DIAGNOSIS — K449 Diaphragmatic hernia without obstruction or gangrene: Secondary | ICD-10-CM | POA: Diagnosis not present

## 2014-07-07 DIAGNOSIS — K219 Gastro-esophageal reflux disease without esophagitis: Secondary | ICD-10-CM | POA: Diagnosis present

## 2014-07-07 DIAGNOSIS — S82141G Displaced bicondylar fracture of right tibia, subsequent encounter for closed fracture with delayed healing: Secondary | ICD-10-CM | POA: Diagnosis not present

## 2014-07-07 DIAGNOSIS — Z419 Encounter for procedure for purposes other than remedying health state, unspecified: Secondary | ICD-10-CM

## 2014-07-07 DIAGNOSIS — Z87440 Personal history of urinary (tract) infections: Secondary | ICD-10-CM | POA: Insufficient documentation

## 2014-07-07 DIAGNOSIS — E1065 Type 1 diabetes mellitus with hyperglycemia: Secondary | ICD-10-CM

## 2014-07-07 DIAGNOSIS — L511 Stevens-Johnson syndrome: Secondary | ICD-10-CM | POA: Insufficient documentation

## 2014-07-07 DIAGNOSIS — Z87891 Personal history of nicotine dependence: Secondary | ICD-10-CM | POA: Diagnosis not present

## 2014-07-07 DIAGNOSIS — S82143A Displaced bicondylar fracture of unspecified tibia, initial encounter for closed fracture: Secondary | ICD-10-CM | POA: Diagnosis present

## 2014-07-07 DIAGNOSIS — M329 Systemic lupus erythematosus, unspecified: Secondary | ICD-10-CM | POA: Diagnosis not present

## 2014-07-07 DIAGNOSIS — E039 Hypothyroidism, unspecified: Secondary | ICD-10-CM | POA: Insufficient documentation

## 2014-07-07 DIAGNOSIS — W1789XD Other fall from one level to another, subsequent encounter: Secondary | ICD-10-CM | POA: Insufficient documentation

## 2014-07-07 DIAGNOSIS — M797 Fibromyalgia: Secondary | ICD-10-CM | POA: Insufficient documentation

## 2014-07-07 DIAGNOSIS — E1069 Type 1 diabetes mellitus with other specified complication: Secondary | ICD-10-CM

## 2014-07-07 DIAGNOSIS — E890 Postprocedural hypothyroidism: Secondary | ICD-10-CM | POA: Diagnosis present

## 2014-07-07 DIAGNOSIS — E559 Vitamin D deficiency, unspecified: Secondary | ICD-10-CM | POA: Diagnosis present

## 2014-07-07 DIAGNOSIS — J189 Pneumonia, unspecified organism: Secondary | ICD-10-CM | POA: Insufficient documentation

## 2014-07-07 DIAGNOSIS — Z9049 Acquired absence of other specified parts of digestive tract: Secondary | ICD-10-CM | POA: Diagnosis not present

## 2014-07-07 DIAGNOSIS — Z880 Allergy status to penicillin: Secondary | ICD-10-CM | POA: Diagnosis not present

## 2014-07-07 DIAGNOSIS — Z882 Allergy status to sulfonamides status: Secondary | ICD-10-CM | POA: Diagnosis not present

## 2014-07-07 DIAGNOSIS — Z794 Long term (current) use of insulin: Secondary | ICD-10-CM | POA: Diagnosis not present

## 2014-07-07 DIAGNOSIS — IMO0002 Reserved for concepts with insufficient information to code with codable children: Secondary | ICD-10-CM | POA: Diagnosis present

## 2014-07-07 HISTORY — DX: Personal history of other diseases of the digestive system: Z87.19

## 2014-07-07 HISTORY — DX: Reserved for concepts with insufficient information to code with codable children: IMO0002

## 2014-07-07 HISTORY — DX: Stevens-Johnson syndrome: L51.1

## 2014-07-07 HISTORY — DX: Vitamin D deficiency, unspecified: E55.9

## 2014-07-07 HISTORY — DX: Pneumonia, unspecified organism: J18.9

## 2014-07-07 HISTORY — DX: Systemic lupus erythematosus, unspecified: M32.9

## 2014-07-07 HISTORY — DX: Urinary tract infection, site not specified: N39.0

## 2014-07-07 HISTORY — PX: EXTERNAL FIXATION LEG: SHX1549

## 2014-07-07 HISTORY — DX: Gastro-esophageal reflux disease without esophagitis: K21.9

## 2014-07-07 LAB — CBC
HCT: 28.1 % — ABNORMAL LOW (ref 36.0–46.0)
Hemoglobin: 9.1 g/dL — ABNORMAL LOW (ref 12.0–15.0)
MCH: 30.7 pg (ref 26.0–34.0)
MCHC: 32.4 g/dL (ref 30.0–36.0)
MCV: 94.9 fL (ref 78.0–100.0)
Platelets: 281 10*3/uL (ref 150–400)
RBC: 2.96 MIL/uL — AB (ref 3.87–5.11)
RDW: 13.9 % (ref 11.5–15.5)
WBC: 8 10*3/uL (ref 4.0–10.5)

## 2014-07-07 LAB — GLUCOSE, CAPILLARY
Glucose-Capillary: 158 mg/dL — ABNORMAL HIGH (ref 70–99)
Glucose-Capillary: 104 mg/dL — ABNORMAL HIGH (ref 70–99)

## 2014-07-07 SURGERY — EXTERNAL FIXATION, LOWER EXTREMITY
Anesthesia: General | Laterality: Right

## 2014-07-07 MED ORDER — MIDAZOLAM HCL 5 MG/5ML IJ SOLN
INTRAMUSCULAR | Status: DC | PRN
  Administered 2014-07-07: 2 mg via INTRAVENOUS

## 2014-07-07 MED ORDER — PROPOFOL 10 MG/ML IV BOLUS
INTRAVENOUS | Status: AC
  Filled 2014-07-07: qty 20

## 2014-07-07 MED ORDER — ONDANSETRON HCL 4 MG/2ML IJ SOLN
INTRAMUSCULAR | Status: AC
  Filled 2014-07-07: qty 2

## 2014-07-07 MED ORDER — WHEELCHAIR MISC: 1.0000 | Freq: Once | Status: DC

## 2014-07-07 MED ORDER — HYDROMORPHONE HCL 1 MG/ML IJ SOLN
INTRAMUSCULAR | Status: AC
  Administered 2014-07-07: 0.25 mg via INTRAVENOUS
  Filled 2014-07-07: qty 1

## 2014-07-07 MED ORDER — ROCURONIUM BROMIDE 50 MG/5ML IV SOLN
INTRAVENOUS | Status: AC
  Filled 2014-07-07: qty 1

## 2014-07-07 MED ORDER — LIDOCAINE HCL (CARDIAC) 20 MG/ML IV SOLN
INTRAVENOUS | Status: AC
  Filled 2014-07-07: qty 5

## 2014-07-07 MED ORDER — LACTATED RINGERS IV SOLN
INTRAVENOUS | Status: DC | PRN
  Administered 2014-07-07 (×2): via INTRAVENOUS

## 2014-07-07 MED ORDER — ONDANSETRON HCL 4 MG/2ML IJ SOLN
INTRAMUSCULAR | Status: DC | PRN
  Administered 2014-07-07: 4 mg via INTRAVENOUS

## 2014-07-07 MED ORDER — PROMETHAZINE HCL 25 MG/ML IJ SOLN: 6.2500 mg | INTRAMUSCULAR | Status: DC | PRN

## 2014-07-07 MED ORDER — FENTANYL CITRATE (PF) 100 MCG/2ML IJ SOLN
INTRAMUSCULAR | Status: DC | PRN
  Administered 2014-07-07 (×2): 50 ug via INTRAVENOUS
  Administered 2014-07-07: 25 ug via INTRAVENOUS

## 2014-07-07 MED ORDER — CLINDAMYCIN PHOSPHATE 900 MG/50ML IV SOLN
900.0000 mg | INTRAVENOUS | Status: AC
  Administered 2014-07-07: 900 mg via INTRAVENOUS
  Filled 2014-07-07: qty 50

## 2014-07-07 MED ORDER — 0.9 % SODIUM CHLORIDE (POUR BTL) OPTIME
TOPICAL | Status: DC | PRN
  Administered 2014-07-07: 1000 mL

## 2014-07-07 MED ORDER — MIDAZOLAM HCL 2 MG/2ML IJ SOLN
INTRAMUSCULAR | Status: AC
  Filled 2014-07-07: qty 2

## 2014-07-07 MED ORDER — VITAMIN D (ERGOCALCIFEROL) 1.25 MG (50000 UNIT) PO CAPS: 50000.0000 [IU] | ORAL_CAPSULE | ORAL | Status: DC

## 2014-07-07 MED ORDER — PROPOFOL 10 MG/ML IV BOLUS
INTRAVENOUS | Status: DC | PRN
  Administered 2014-07-07: 150 mg via INTRAVENOUS

## 2014-07-07 MED ORDER — HYDROMORPHONE HCL 1 MG/ML IJ SOLN
0.2500 mg | INTRAMUSCULAR | Status: DC | PRN
  Administered 2014-07-07: 0.5 mg via INTRAVENOUS
  Administered 2014-07-07 (×2): 0.25 mg via INTRAVENOUS

## 2014-07-07 MED ORDER — PHENYLEPHRINE HCL 10 MG/ML IJ SOLN
INTRAMUSCULAR | Status: DC | PRN
  Administered 2014-07-07 (×5): 80 ug via INTRAVENOUS

## 2014-07-07 MED ORDER — FENTANYL CITRATE (PF) 100 MCG/2ML IJ SOLN: 25.0000 ug | INTRAMUSCULAR | Status: DC | PRN

## 2014-07-07 MED ORDER — LIDOCAINE HCL (CARDIAC) 20 MG/ML IV SOLN
INTRAVENOUS | Status: DC | PRN
  Administered 2014-07-07: 50 mg via INTRAVENOUS

## 2014-07-07 MED ORDER — FENTANYL CITRATE (PF) 250 MCG/5ML IJ SOLN
INTRAMUSCULAR | Status: AC
  Filled 2014-07-07: qty 5

## 2014-07-07 SURGICAL SUPPLY — 68 items
BANDAGE ELASTIC 3 VELCRO ST LF (GAUZE/BANDAGES/DRESSINGS) ×2 IMPLANT
BANDAGE ELASTIC 4 VELCRO ST LF (GAUZE/BANDAGES/DRESSINGS) ×2 IMPLANT
BANDAGE ELASTIC 6 VELCRO ST LF (GAUZE/BANDAGES/DRESSINGS) ×2 IMPLANT
BANDAGE ESMARK 6X9 LF (GAUZE/BANDAGES/DRESSINGS) ×1 IMPLANT
BNDG COHESIVE 6X5 TAN STRL LF (GAUZE/BANDAGES/DRESSINGS) ×2 IMPLANT
BNDG ESMARK 6X9 LF (GAUZE/BANDAGES/DRESSINGS) ×2
BNDG GAUZE ELAST 4 BULKY (GAUZE/BANDAGES/DRESSINGS) ×2 IMPLANT
BRUSH SCRUB DISP (MISCELLANEOUS) ×4 IMPLANT
CLAMP LG COMBINATION (Clamp) ×6 IMPLANT
CLEANER TIP ELECTROSURG 2X2 (MISCELLANEOUS) IMPLANT
COVER SURGICAL LIGHT HANDLE (MISCELLANEOUS) ×2 IMPLANT
CUFF TOURNIQUET SINGLE 18IN (TOURNIQUET CUFF) IMPLANT
CUFF TOURNIQUET SINGLE 24IN (TOURNIQUET CUFF) IMPLANT
CUFF TOURNIQUET SINGLE 34IN LL (TOURNIQUET CUFF) IMPLANT
DRAPE C-ARM 42X72 X-RAY (DRAPES) ×2 IMPLANT
DRAPE C-ARMOR (DRAPES) ×2 IMPLANT
DRAPE U-SHAPE 47X51 STRL (DRAPES) ×2 IMPLANT
DRSG ADAPTIC 3X8 NADH LF (GAUZE/BANDAGES/DRESSINGS) IMPLANT
ELECT REM PT RETURN 9FT ADLT (ELECTROSURGICAL) ×2
ELECTRODE REM PT RTRN 9FT ADLT (ELECTROSURGICAL) ×1 IMPLANT
EVACUATOR 1/8 PVC DRAIN (DRAIN) IMPLANT
GAUZE SPONGE 4X4 12PLY STRL (GAUZE/BANDAGES/DRESSINGS) IMPLANT
GLOVE BIO SURGEON STRL SZ 6 (GLOVE) ×2 IMPLANT
GLOVE BIO SURGEON STRL SZ7.5 (GLOVE) ×2 IMPLANT
GLOVE BIO SURGEON STRL SZ8 (GLOVE) ×2 IMPLANT
GLOVE BIOGEL PI IND STRL 6 (GLOVE) ×1 IMPLANT
GLOVE BIOGEL PI IND STRL 7.5 (GLOVE) ×1 IMPLANT
GLOVE BIOGEL PI IND STRL 8 (GLOVE) ×1 IMPLANT
GLOVE BIOGEL PI INDICATOR 6 (GLOVE) ×1
GLOVE BIOGEL PI INDICATOR 7.5 (GLOVE) ×1
GLOVE BIOGEL PI INDICATOR 8 (GLOVE) ×1
GLOVE SURG SS PI 7.0 STRL IVOR (GLOVE) ×2 IMPLANT
GOWN STRL REUS W/ TWL LRG LVL3 (GOWN DISPOSABLE) ×2 IMPLANT
GOWN STRL REUS W/ TWL XL LVL3 (GOWN DISPOSABLE) ×1 IMPLANT
GOWN STRL REUS W/TWL LRG LVL3 (GOWN DISPOSABLE) ×2
GOWN STRL REUS W/TWL XL LVL3 (GOWN DISPOSABLE) ×1
HANDPIECE INTERPULSE COAX TIP (DISPOSABLE)
KIT BASIN OR (CUSTOM PROCEDURE TRAY) ×2 IMPLANT
KIT ROOM TURNOVER OR (KITS) ×2 IMPLANT
MANIFOLD NEPTUNE II (INSTRUMENTS) IMPLANT
NEEDLE 22X1 1/2 (OR ONLY) (NEEDLE) IMPLANT
NS IRRIG 1000ML POUR BTL (IV SOLUTION) ×2 IMPLANT
PACK ORTHO EXTREMITY (CUSTOM PROCEDURE TRAY) ×2 IMPLANT
PAD ARMBOARD 7.5X6 YLW CONV (MISCELLANEOUS) ×4 IMPLANT
PAD CAST 3X4 CTTN HI CHSV (CAST SUPPLIES) ×1 IMPLANT
PADDING CAST COTTON 3X4 STRL (CAST SUPPLIES) ×1
PADDING CAST COTTON 6X4 STRL (CAST SUPPLIES) IMPLANT
ROD CRBN FBR LRG EX-FX 11X150 (Rod) ×2 IMPLANT
ROD CRBN FBR LRG EX-FX 11X300 (Rod) ×2 IMPLANT
SET HNDPC FAN SPRY TIP SCT (DISPOSABLE) IMPLANT
SPONGE LAP 18X18 X RAY DECT (DISPOSABLE) ×2 IMPLANT
SPONGE SCRUB IODOPHOR (GAUZE/BANDAGES/DRESSINGS) ×2 IMPLANT
STAPLER VISISTAT 35W (STAPLE) ×2 IMPLANT
STOCKINETTE IMPERVIOUS LG (DRAPES) ×2 IMPLANT
STRIP CLOSURE SKIN 1/2X4 (GAUZE/BANDAGES/DRESSINGS) IMPLANT
SUCTION FRAZIER TIP 10 FR DISP (SUCTIONS) IMPLANT
SUT ETHILON 3 0 PS 1 (SUTURE) ×2 IMPLANT
SUT VIC AB 0 CT1 27 (SUTURE)
SUT VIC AB 0 CT1 27XBRD ANBCTR (SUTURE) IMPLANT
SUT VIC AB 2-0 CT1 27 (SUTURE)
SUT VIC AB 2-0 CT1 TAPERPNT 27 (SUTURE) IMPLANT
SYR CONTROL 10ML LL (SYRINGE) IMPLANT
TOWEL OR 17X24 6PK STRL BLUE (TOWEL DISPOSABLE) ×4 IMPLANT
TOWEL OR 17X26 10 PK STRL BLUE (TOWEL DISPOSABLE) ×2 IMPLANT
TUBE CONNECTING 12X1/4 (SUCTIONS) ×2 IMPLANT
UNDERPAD 30X30 INCONTINENT (UNDERPADS AND DIAPERS) ×4 IMPLANT
WATER STERILE IRR 1000ML POUR (IV SOLUTION) IMPLANT
YANKAUER SUCT BULB TIP NO VENT (SUCTIONS) ×2 IMPLANT

## 2014-07-07 NOTE — Brief Op Note (Signed)
07/07/2014  9:03 AM  PATIENT:  Autumn Daniels  54 y.o. female  PRE-OPERATIVE DIAGNOSIS:  TIBIAL PLATEAU  FRACTURE ON RIGHT  POST-OPERATIVE DIAGNOSIS:  TIBIAL PLATEAU  FRACTURE ON RIGHT  PROCEDURE:  Procedure(s): 1. REVISION EXTERNAL FIXATION LEG (Right) 2. REVISION CLOSED REDUCTION RIGHT TIBIAL PLATEAU  SURGEON:  Surgeon(s) and Role:    * Myrene GalasMichael Jerrell Mangel, MD - Primary  PHYSICIAN ASSISTANT: Montez MoritaKeith Paul, PA-C  ANESTHESIA:   general  I/O:  Total I/O In: 1000 [I.V.:1000] Out: 10 [Blood:10]  SPECIMEN:  No Specimen  TOURNIQUET:  * No tourniquets in log *  DICTATION: .Other Dictation: Dictation Number [id number]

## 2014-07-07 NOTE — Progress Notes (Signed)
Spoke with Durward Mallardamille in case management. She will arrange to have a wheel chair sent to the PACU for patient to go home with.

## 2014-07-07 NOTE — Anesthesia Preprocedure Evaluation (Signed)
Anesthesia Evaluation  Patient identified by MRN, date of birth, ID band  Reviewed: Allergy & Precautions, NPO status , Patient's Chart, lab work & pertinent test results  Airway Mallampati: II  TM Distance: >3 FB     Dental   Pulmonary pneumonia -, former smoker,  breath sounds clear to auscultation        Cardiovascular negative cardio ROS  Rhythm:Regular Rate:Normal     Neuro/Psych    GI/Hepatic Neg liver ROS, hiatal hernia, GERD-  ,  Endo/Other  diabetes  Renal/GU negative Renal ROS     Musculoskeletal   Abdominal   Peds  Hematology   Anesthesia Other Findings   Reproductive/Obstetrics                             Anesthesia Physical Anesthesia Plan  ASA: III  Anesthesia Plan: General   Post-op Pain Management:    Induction: Intravenous  Airway Management Planned: LMA  Additional Equipment:   Intra-op Plan:   Post-operative Plan: Extubation in OR  Informed Consent: I have reviewed the patients History and Physical, chart, labs and discussed the procedure including the risks, benefits and alternatives for the proposed anesthesia with the patient or authorized representative who has indicated his/her understanding and acceptance.   Dental advisory given  Plan Discussed with: CRNA and Anesthesiologist  Anesthesia Plan Comments:         Anesthesia Quick Evaluation

## 2014-07-07 NOTE — Discharge Planning (Addendum)
07/07/2014 1020 Shawntez Dickison, RN, BSN, [employer] [phone]. Pt qualifies for DME wheelchair.  DME ordered through [facility].  [facility] room prior to D/C home.

## 2014-07-07 NOTE — Anesthesia Procedure Notes (Signed)
Procedure Name: LMA Insertion Performed by: Kizzie FantasiaARVER, Katlyne Nishida J Pre-anesthesia Checklist: Patient identified, Patient being monitored, Emergency Drugs available, Timeout performed and Suction available Patient Re-evaluated:Patient Re-evaluated prior to inductionOxygen Delivery Method: Circle system utilized Preoxygenation: Pre-oxygenation with 100% oxygen Intubation Type: IV induction Ventilation: Mask ventilation without difficulty LMA: LMA inserted LMA Size: 4.0 Number of attempts: 1 Placement Confirmation: breath sounds checked- equal and bilateral,  CO2 detector and positive ETCO2 Tube secured with: Tape Dental Injury: Teeth and Oropharynx as per pre-operative assessment

## 2014-07-07 NOTE — Discharge Instructions (Signed)
[facility] Discharge Instructions   General Discharge Instructions  WEIGHT BEARING STATUS: Nonweightbearing Right Leg  RANGE OF MOTION/ACTIVITY:ankle and toe range of motion as tolerated   Wound Care: daily dressing changes starting 07/09/2014. See pin care instructions below   Diet: as you were eating previously.  Can use over the counter stool softeners and bowel preparations, such as Miralax, to help with bowel movements.  Narcotics can be constipating.  Be sure to drink plenty of fluids  STOP SMOKING OR USING NICOTINE PRODUCTS!!!!  As discussed nicotine severely impairs your body's ability to heal surgical and traumatic wounds but also impairs bone healing.  Wounds and bone heal by forming microscopic blood vessels (angiogenesis) and nicotine is a vasoconstrictor (essentially, shrinks blood vessels).  Therefore, if vasoconstriction occurs to these microscopic blood vessels they essentially disappear and are unable to deliver necessary nutrients to the healing tissue.  This is one modifiable factor that you can do to dramatically increase your chances of healing your injury.    (This means no smoking, no nicotine gum, patches, etc)  DO NOT USE NONSTEROIDAL ANTI-INFLAMMATORY DRUGS (NSAID'S)  Using products such as Advil (ibuprofen), Aleve (naproxen), Motrin (ibuprofen) for additional pain control during fracture healing can delay and/or prevent the healing response.  If you would like to take over the counter (OTC) medication, Tylenol (acetaminophen) is ok.  However, some narcotic medications that are given for pain control contain acetaminophen as well. Therefore, you should not exceed more than 4000 mg of tylenol in a day if you do not have liver disease.  Also note that there are may OTC medicines, such as cold medicines and allergy medicines that my contain tylenol as well.  If you have any questions about medications and/or interactions please ask your doctor/PA or your  pharmacist.   PAIN MEDICATION USE AND EXPECTATIONS  You have likely been given narcotic medications to help control your pain.  After a traumatic event that results in an fracture (broken bone) with or without surgery, it is ok to use narcotic pain medications to help control one's pain.  We understand that everyone responds to pain differently and each individual patient will be evaluated on a regular basis for the continued need for narcotic medications. Ideally, narcotic medication use should last no more than 6-8 weeks (coinciding with fracture healing).   As a patient it is your responsibility as well to monitor narcotic medication use and report the amount and frequency you use these medications when you come to your office visit.   We would also advise that if you are using narcotic medications, you should take a dose prior to therapy to maximize you participation.  IF YOU ARE ON NARCOTIC MEDICATIONS IT IS NOT PERMISSIBLE TO OPERATE A MOTOR VEHICLE (MOTORCYCLE/CAR/TRUCK/MOPED) OR HEAVY MACHINERY DO NOT MIX NARCOTICS WITH OTHER CNS (CENTRAL NERVOUS SYSTEM) DEPRESSANTS SUCH AS ALCOHOL       ICE AND ELEVATE INJURED/OPERATIVE EXTREMITY  Using ice and elevating the injured extremity above your heart can help with swelling and pain control.  Icing in a pulsatile fashion, such as 20 minutes on and 20 minutes off, can be followed.    Do not place ice directly on skin. Make sure there is a barrier between to skin and the ice pack.    Using frozen items such as frozen peas works well as the conform nicely to the are that needs to be iced.  USE AN ACE WRAP OR TED HOSE FOR SWELLING CONTROL  In addition  to icing and elevation, Ace wraps or TED hose are used to help limit and resolve swelling.  It is recommended to use Ace wraps or TED hose until you are informed to stop.    When using Ace Wraps start the wrapping distally (farthest away from the body) and wrap proximally (closer to the  body)   Example: If you had surgery on your leg or thing and you do not have a splint on, start the ace wrap at the toes and work your way up to the thigh        If you had surgery on your upper extremity and do not have a splint on, start the ace wrap at your fingers and work your way up to the upper arm  IF YOU ARE IN A SPLINT OR CAST DO NOT [place]   If your splint gets wet for any reason please contact the office immediately. You may shower in your splint or cast as long as you keep it dry.  This can be done by wrapping in a cast cover or garbage back (or similar)  Do Not stick any thing down your splint or cast such as pencils, money, or hangers to try and scratch yourself with.  If you feel itchy take benadryl as prescribed on the bottle for itching  IF YOU ARE IN A CAM BOOT (BLACK BOOT)  You may remove boot periodically. Perform daily dressing changes as noted below.  Wash the liner of the boot regularly and wear a sock when wearing the boot. It is recommended that you sleep in the boot until told otherwise  CALL THE OFFICE WITH ANY QUESTIONS OR CONCERTS: [phone]     Discharge Pin Site Instructions  Dress pins daily with Kerlix roll starting on POD 2. Wrap the Kerlix so that it tamps the skin down around the pin-skin interface to prevent/limit motion of the skin relative to the pin.  (Pin-skin motion is the primary cause of pain and infection related to external fixator pin sites).  Remove any crust or coagulum that may obstruct drainage with a saline moistened gauze or soap and water.  After POD 3, if there is no discernable drainage on the pin site dressing, the interval for change can by increased to every other day.  You may shower with the fixator, cleaning all pin sites gently with soap and water.  If you have a surgical wound this needs to be completely dry and without drainage before showering.  The extremity can be lifted by the fixator to facilitate  wound care and transfers.  Notify the office/Doctor if you experience increasing drainage, redness, or pain from a pin site, or if you notice purulent (thick, snot-like) drainage.  Discharge Wound Care Instructions  Do NOT apply any ointments, solutions or lotions to pin sites or surgical wounds.  These prevent needed drainage and even though solutions like hydrogen peroxide kill bacteria, they also damage cells lining the pin sites that help fight infection.  Applying lotions or ointments can keep the wounds moist and can cause them to breakdown and open up as well. This can increase the risk for infection. When in doubt call the office.  Surgical incisions should be dressed daily.  If any drainage is noted, use one layer of adaptic, then gauze, Kerlix, and an ace wrap.  Once the incision is completely dry and without drainage, it may be left open to air out.  Showering may begin 36-48 hours later.  Cleaning gently with soap and water.  Traumatic wounds should be dressed daily as well.    One layer of adaptic, gauze, Kerlix, then ace wrap.  The adaptic can be discontinued once the draining has ceased    If you have a wet to dry dressing: wet the gauze with saline the squeeze as much saline out so the gauze is moist (not soaking wet), place moistened gauze over wound, then place a dry gauze over the moist one, followed by Kerlix wrap, then ace wrap.

## 2014-07-07 NOTE — Progress Notes (Signed)
Orthopedic Tech Progress Note Patient Details:  Mindi CurlingMary Nikkel [birth year] [id number]  Ortho Devices Ortho Device/Splint Location: Prafo boot Ortho Device/Splint Interventions: Sarita BottomOrdered   Cammer, Cortina Vultaggio Carol 07/07/2014, 8:35 AM

## 2014-07-07 NOTE — Op Note (Signed)
Autumn Daniels                 ACCOUNT NO.:  [id number]  MEDICAL RECORD NO.:  [id number]  LOCATION:  [facility]                         FACILITY:  [facility]  PHYSICIAN:  Doralee Albino. Carola Frost, M.D. DATE OF BIRTH:  07/18/1960  DATE OF PROCEDURE:  07/07/2014 DATE OF DISCHARGE:                              OPERATIVE REPORT   PREOPERATIVE DIAGNOSIS:  Shortened right tibial plateau fracture, status post spanning external fixation.  POSTOPERATIVE DIAGNOSIS:  Shortened right tibial plateau fracture, status post spanning external fixation.  PROCEDURE: 1. Revision external fixation of the right tibial plateau with     multiple new clamps and bars. 2. Revision closed reduction of the right tibial plateau. 3. Exchange of silk for nylon sutures secondary to soft tissue reaction.  SURGEON:  Doralee Albino. Carola Frost, M.D.  ASSISTANT:  Mearl Latin, PA-C.  ANESTHESIA:  General.  COMPLICATIONS:  None.  TOURNIQUET:  None.  I/O:  1000 mL crystalloid, out scant.  DISPOSITION:  PACU.  CONDITION:  Stable.  BRIEF SUMMARY AND INDICATION FOR PROCEDURE:  Autumn Daniels is a 54 year old female sustained a tibial plateau fracture.  Following from a step stool.  She had severe bicondylar fracture treated with spanning external fixation and observation by Dr. Fuller Canada in [place] and was referred for further evaluation and management.  On followup, she was found to have shortening of her fracture and we recommended return to the OR with revision external fixation supplementation with additional bars and control and then exchange of the sutures distally where she had rather significant soft tissue reaction to the silk, but was tolerating her proximal nylon sutures without difficulty.  We explained the risks to include, subsequent need for surgery.  Her skin was not very clean, anticipated area of incision was not suitable for early repair.  We then also discussed failure to prevent pin tract infection of  the nerve or vessel injury and DVT/PE.  She wished to proceed.  BRIEF SUMMARY OF PROCEDURE:  The patient received preoperative antibiotics, taken to the operating room where general anesthesia was induced.  She was then positioned on the radiolucent table and the old clamps and bars removed.  This did reveal asymmetry to the pin placements such that using the current frame construct to connect them all would either put torque on the pins or restrict our ability to achieve a more anatomic reduction.  Consequently, we applied one carbon fiber rod between just the two pins on the femur and a separate rod b/t the two pins in the tibia; and then built a more stout two bar construct across the fracture from the two bars on the femur and tibia. The new construct to allow for restoration of length and Alignment, which I manipulated the extremity to achieve.  This was checked under [facility] and lateral fluoro images and I held the patient at full length while Mr. Renae Fickle applied all of the clamps and secured them.  We then exchanged the distal silk for nylon sutures given the significant swelling and erythema there, and then applied gently compressive dressing from foot to thigh.  X- rays showed marked improvement in length and alignment; however, some of the articular impaction  remained and ligamentotaxis was not able to completely restore the fragments to more elevated position.  PROGNOSIS:  Autumn Daniels's reconstruction is going to be difficult, but certainly will be more feasible as a result of this revision, external fixation and reduction.  We anticipate this taking place in another 9 days or so and we will put on DVT prophylaxis with Lovenox with ice and elevation.     Doralee AlbinoMichael H. Carola FrostHandy, M.D.     [facility]MEDQ  D:  07/07/2014  T:  07/07/2014  Job:  [id number]

## 2014-07-07 NOTE — Anesthesia Postprocedure Evaluation (Signed)
  Anesthesia Post-op Note  Patient: Autumn Daniels  Procedure(s) Performed: Procedure(s): REVISION EXTERNAL FIXATION LEG (Right)  Patient Location: PACU  Anesthesia Type:General  Level of Consciousness: awake  Airway and Oxygen Therapy: Patient Spontanous Breathing  Post-op Pain: mild  Post-op Assessment: Post-op Vital signs reviewed  Post-op Vital Signs: Reviewed  Last Vitals:  Filed Vitals:   07/07/14 0915  BP:   Pulse: 84  Temp:   Resp: 16    Complications: No apparent anesthesia complications

## 2014-07-07 NOTE — Progress Notes (Signed)
Transportation tech in PACU to take pt to CT

## 2014-07-07 NOTE — Progress Notes (Signed)
Report given to Jamie RN

## 2014-07-07 NOTE — Transfer of Care (Signed)
Immediate Anesthesia Transfer of Care Note  Patient: Autumn CurlingMary Parrack  Procedure(s) Performed: Procedure(s): REVISION EXTERNAL FIXATION LEG (Right)  Patient Location: PACU  Anesthesia Type:General  Level of Consciousness: awake and alert   Airway & Oxygen Therapy: Patient Spontanous Breathing and Patient connected to nasal cannula oxygen  Post-op Assessment: Report given to RN and Post -op Vital signs reviewed and stable  Post vital signs: Reviewed and stable  Last Vitals:  Filed Vitals:   07/07/14 0639  BP: 112/67  Pulse: 88  Temp: 36.7 C  Resp: 18    Complications: No apparent anesthesia complications

## 2014-07-08 LAB — HEMOGLOBIN A1C
Hgb A1c MFr Bld: 6.6 % — ABNORMAL HIGH (ref 4.8–5.6)
MEAN PLASMA GLUCOSE: 143 mg/dL

## 2014-07-08 LAB — VITAMIN D 25 HYDROXY (VIT D DEFICIENCY, FRACTURES): Vit D, 25-Hydroxy: 16.6 ng/mL — ABNORMAL LOW (ref 30.0–100.0)

## 2014-07-10 ENCOUNTER — Encounter (HOSPITAL_COMMUNITY): Payer: Self-pay | Admitting: Orthopedic Surgery

## 2014-07-13 LAB — VITAMIN D 1,25 DIHYDROXY
Vitamin D 1, 25 (OH)2 Total: 61 pg/mL
Vitamin D2 1, 25 (OH)2: 31 pg/mL
Vitamin D3 1, 25 (OH)2: 30 pg/mL

## 2014-07-17 ENCOUNTER — Encounter (HOSPITAL_COMMUNITY): Payer: Self-pay | Admitting: *Deleted

## 2014-07-17 NOTE — Interval H&P Note (Signed)
History and Physical Interval Note:  07/17/2014 3:03 PM  Autumn Daniels  has presented today for surgery, with the diagnosis of RIGHT TIBIAL PLATEAU FRACTURE  The various methods of treatment have been discussed with the patient and family. After consideration of risks, benefits and other options for treatment, the patient has consented to  Procedure(s): RIGHT OPEN REDUCTION INTERNAL FIXATION (ORIF) TIBIA FRACTURE (Right) as a surgical intervention .  The patient's history has been reviewed, patient examined, no change in status, stable for surgery.  I have reviewed the patient's chart and labs.  Questions were answered to the patient's satisfaction.    Soft tissue swelling has resolved nicely No interval change otherwise Pt ready for formal ORIF R tibial plateau   Mearl LatinKeith W. Curly Mackowski, PA-C [facility] [phone] (P)

## 2014-07-17 NOTE — H&P (View-Only) (Signed)
[facility]opa[facility]uma [facility] H&P      Chief Complaint: Right tibial plateau fracture HPI:   54 year old female fell off of a step stool on 06/30/2014 and sustained a severe bicondylar right tibial plateau fracture. Patient was seen and evaluated at the [facility] where she had an external fixator applied by the orthopedic surgeon at that institution. She was referred to the orthopedic trauma specialists for definitive management. Patient was seen in the office on 07/05/2014. Follow-up films showed persistent shortening of her fracture. It was felt that given the amount of swelling in her current alignment that she would benefit from return to the OR for external fixator revision as it would be at least 2 weeks for definitive fixation could occur. Patient presents today for external fixator revision  Past Medical History  Diagnosis Date  . Thyroid disease   . Fibromyalgia   . Diabetes mellitus without complication     type 2  . Stevens-Johnson syndrome   . Pneumonia   . Frequent UTI     due to small urethra  . GERD (gastroesophageal reflux disease)   . History of hiatal hernia     Past Surgical History  Procedure Laterality Date  . Tonsillectomy    . Cholecystectomy      2 bifurcations of liver during lap chole  . Liver surgery      x4 due to bifurcations from lap chole  . External fixation leg Right 06/30/2014    Procedure: EXTERNAL FIXATION LEG;  Surgeon: Vickki Hearing, MD;  Location: [facility];  Service: Orthopedics;  Laterality: Right;  synthes large fragment exteranal fixation  c arm     Family History  Problem Relation Age of Onset  . Colon cancer Mother   . Lung disease Father    Social History:  reports that she quit smoking 7 days ago. Her smoking use included Cigarettes. She has a 15 pack-year smoking history. She has never used smokeless tobacco. She reports that she does not drink alcohol or use illicit drugs.  Allergies:  Allergies  Allergen Reactions   . 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   Medications No current facility-administered medications on file prior to encounter.   Current Outpatient Prescriptions on File Prior to Encounter  Medication Sig Dispense Refill  . Albiglutide 50 MG PEN Inject 150 Units into the skin once a week. Takes on Monday    . ARMOUR THYROID 60 MG tablet Take 2.5 tablets by mouth daily.  2  . Insulin Glargine (TOUJEO SOLOSTAR [place]) Inject 20 Units into the skin at bedtime.    Marland Kitchen oxyCODONE-acetaminophen (PERCOCET/ROXICET) 5-325 MG per tablet Take 1 tablet by mouth every 4 (four) hours. 90 tablet 0   No results found for this or any previous visit (from the past 48 hour(s)). No results found.  Review of Systems  Constitutional: Negative for fever and chills.  Respiratory: Negative for shortness of breath and wheezing.   Cardiovascular: Negative for chest pain and palpitations.  Gastrointestinal: Negative for nausea, vomiting and abdominal pain.  Genitourinary: Negative for dysuria.  Musculoskeletal:       Right knee pain  Neurological: Negative for tingling, sensory change and headaches.    Vital signs to be obtained on arrival to short stay Physical Exam  Constitutional: She is oriented to person, place, and time. She appears  well-developed and well-nourished.  HENT:  Head: Normocephalic and atraumatic.  Eyes: EOM are normal. Pupils are equal, round, and reactive to light.  Cardiovascular: Normal rate and regular rhythm.   Respiratory: Effort normal and breath sounds normal. She has no wheezes.  GI: Soft. Bowel sounds are normal. There is no tenderness.  Musculoskeletal:  Right lower extremity   Spanning external fixator in place   4 independent pins   2 half pins in the femur and two half pins in the tibia   Pin sites are notable for some  erythema. Serosanguineous drainage.   Moderate knee effusion   Extensive swelling to the right upper extremity   Skin does not wrinkle with gentle compression laterally   Compartments are compressible and nontender   Extensive bruising from foot all the way to the proximal thigh posteriorly   EHL, FHL, lesser toe motor functions intact. Anterior tibialis, posterior tibialis, peroneals and gastrocsoleus complex motor functions intact   DPN, SPN, TN sensory functions intact   Palpable dorsalis pedis pulse    Neurological: She is alert and oriented to person, place, and time.  Psychiatric: She has a normal mood and affect. Her behavior is normal. Thought content normal.     Imaging     Office x-rays demonstrate a severely comminuted bicondylar tibial plateau fracture with significant lateral translation of the lateral plateau as well as significant shortening of the fracture itself    Comminuted fracture of the proximal fibula is also noted  Assessment/Plan   54 year old female with a severely comminuted bicondylar right tibial plateau fracture  Return to the OR for external fixator revision to restore length and improved alignment This would be a stage procedure for plate osteosynthesis which will likely occur in 10-14 days Patient was sent risks and benefits associated with surgery and wishes to proceed This will be an outpatient procedure  Mearl LatinKeith W. Emerita Berkemeier, PA-C [facility] [phone] (P) 07/06/2014, 6:47 PM

## 2014-07-17 NOTE — Progress Notes (Signed)
Pt denies SOB, chest pain, and being under the care of a cardiologist. Pt stated that she had a stress test done 8-10 years ago but denies having an echo and cardiac cath. Pt made aware to not take any diabetic medications the morning of procedure. Pt made aware to stop taking Aspirin, otc vitamins and herbal medications. Do not take any NSAIDs ie: Ibuprofen, Advil, Naproxen or any medication containing Aspirin. Pt verbalized understanding of all pre-op instructions.

## 2014-07-18 ENCOUNTER — Encounter (HOSPITAL_COMMUNITY)
Admission: RE | Disposition: A | Discharge status: Home / Self Care | Payer: Self-pay | Source: Ambulatory Visit | Attending: Orthopedic Surgery

## 2014-07-18 ENCOUNTER — Inpatient Hospital Stay (HOSPITAL_COMMUNITY)
Admission: RE | Admit: 2014-07-18 | Discharge: 2014-07-20 | DRG: 493 | Disposition: A | Discharge status: Home / Self Care | Payer: BLUE CROSS/BLUE SHIELD | Source: Ambulatory Visit | Attending: Orthopedic Surgery | Admitting: Orthopedic Surgery

## 2014-07-18 ENCOUNTER — Inpatient Hospital Stay (HOSPITAL_COMMUNITY): Payer: BLUE CROSS/BLUE SHIELD | Admitting: Anesthesiology

## 2014-07-18 ENCOUNTER — Inpatient Hospital Stay (HOSPITAL_COMMUNITY): Payer: BLUE CROSS/BLUE SHIELD

## 2014-07-18 ENCOUNTER — Encounter (HOSPITAL_COMMUNITY): Payer: Self-pay | Admitting: *Deleted

## 2014-07-18 DIAGNOSIS — K219 Gastro-esophageal reflux disease without esophagitis: Secondary | ICD-10-CM | POA: Diagnosis present

## 2014-07-18 DIAGNOSIS — E559 Vitamin D deficiency, unspecified: Secondary | ICD-10-CM | POA: Diagnosis present

## 2014-07-18 DIAGNOSIS — M797 Fibromyalgia: Secondary | ICD-10-CM | POA: Diagnosis present

## 2014-07-18 DIAGNOSIS — W11XXXA Fall on and from ladder, initial encounter: Secondary | ICD-10-CM | POA: Diagnosis present

## 2014-07-18 DIAGNOSIS — E890 Postprocedural hypothyroidism: Secondary | ICD-10-CM | POA: Diagnosis present

## 2014-07-18 DIAGNOSIS — D62 Acute posthemorrhagic anemia: Secondary | ICD-10-CM | POA: Diagnosis not present

## 2014-07-18 DIAGNOSIS — E1065 Type 1 diabetes mellitus with hyperglycemia: Secondary | ICD-10-CM

## 2014-07-18 DIAGNOSIS — E1069 Type 1 diabetes mellitus with other specified complication: Secondary | ICD-10-CM

## 2014-07-18 DIAGNOSIS — E079 Disorder of thyroid, unspecified: Secondary | ICD-10-CM | POA: Diagnosis present

## 2014-07-18 DIAGNOSIS — Z87891 Personal history of nicotine dependence: Secondary | ICD-10-CM | POA: Diagnosis not present

## 2014-07-18 DIAGNOSIS — E119 Type 2 diabetes mellitus without complications: Secondary | ICD-10-CM | POA: Diagnosis present

## 2014-07-18 DIAGNOSIS — S82143A Displaced bicondylar fracture of unspecified tibia, initial encounter for closed fracture: Secondary | ICD-10-CM

## 2014-07-18 DIAGNOSIS — M329 Systemic lupus erythematosus, unspecified: Secondary | ICD-10-CM | POA: Diagnosis present

## 2014-07-18 DIAGNOSIS — S82141A Displaced bicondylar fracture of right tibia, initial encounter for closed fracture: Principal | ICD-10-CM | POA: Diagnosis present

## 2014-07-18 DIAGNOSIS — Z419 Encounter for procedure for purposes other than remedying health state, unspecified: Secondary | ICD-10-CM

## 2014-07-18 DIAGNOSIS — IMO0002 Reserved for concepts with insufficient information to code with codable children: Secondary | ICD-10-CM

## 2014-07-18 HISTORY — PX: ORIF TIBIA PLATEAU: SHX2132

## 2014-07-18 HISTORY — PX: EXTERNAL FIXATION REMOVAL: SHX5040

## 2014-07-18 LAB — COMPREHENSIVE METABOLIC PANEL
ALT: 13 U/L (ref 0–35)
ANION GAP: 11 (ref 5–15)
AST: 16 U/L (ref 0–37)
Albumin: 3.5 g/dL (ref 3.5–5.2)
Alkaline Phosphatase: 130 U/L — ABNORMAL HIGH (ref 39–117)
BILIRUBIN TOTAL: 0.5 mg/dL (ref 0.3–1.2)
BUN: 13 mg/dL (ref 6–23)
CALCIUM: 9.9 mg/dL (ref 8.4–10.5)
CHLORIDE: 103 mmol/L (ref 96–112)
CO2: 24 mmol/L (ref 19–32)
CREATININE: 0.55 mg/dL (ref 0.50–1.10)
GFR calc Af Amer: >90 mL/min (ref >90)
GLUCOSE: 102 mg/dL — AB (ref 70–99)
Potassium: 3.3 mmol/L — ABNORMAL LOW (ref 3.5–5.1)
Sodium: 138 mmol/L (ref 135–145)
TOTAL PROTEIN: 6.8 g/dL (ref 6.0–8.3)

## 2014-07-18 LAB — CBC WITH DIFFERENTIAL/PLATELET
BASOS ABS: 0.1 10*3/uL (ref 0.0–0.1)
Basophils Relative: 1 % (ref 0–1)
EOS ABS: 0.3 10*3/uL (ref 0.0–0.7)
Eosinophils Relative: 3 % (ref 0–5)
HEMATOCRIT: 33.6 % — AB (ref 36.0–46.0)
HEMOGLOBIN: 10.7 g/dL — AB (ref 12.0–15.0)
LYMPHS PCT: 33 % (ref 12–46)
Lymphs Abs: 2.7 10*3/uL (ref 0.7–4.0)
MCH: 30.1 pg (ref 26.0–34.0)
MCHC: 31.8 g/dL (ref 30.0–36.0)
MCV: 94.6 fL (ref 78.0–100.0)
MONO ABS: 0.7 10*3/uL (ref 0.1–1.0)
Monocytes Relative: 9 % (ref 3–12)
NEUTROS PCT: 54 % (ref 43–77)
Neutro Abs: 4.3 10*3/uL (ref 1.7–7.7)
Platelets: 389 10*3/uL (ref 150–400)
RBC: 3.55 MIL/uL — AB (ref 3.87–5.11)
RDW: 14.3 % (ref 11.5–15.5)
WBC: 8 10*3/uL (ref 4.0–10.5)

## 2014-07-18 LAB — PROTIME-INR
INR: 0.96 (ref 0.00–1.49)
Prothrombin Time: 12.9 seconds (ref 11.6–15.2)

## 2014-07-18 LAB — ABO/RH: ABO/RH(D): O POS

## 2014-07-18 LAB — GLUCOSE, CAPILLARY
GLUCOSE-CAPILLARY: 116 mg/dL — AB (ref 70–99)
Glucose-Capillary: 149 mg/dL — ABNORMAL HIGH (ref 70–99)
Glucose-Capillary: 123 mg/dL — ABNORMAL HIGH (ref 70–99)
Glucose-Capillary: 131 mg/dL — ABNORMAL HIGH (ref 70–99)
Glucose-Capillary: 162 mg/dL — ABNORMAL HIGH (ref 70–99)

## 2014-07-18 LAB — TYPE AND SCREEN
ABO/RH(D): O POS
Antibody Screen: NEGATIVE

## 2014-07-18 LAB — APTT: aPTT: 29 seconds (ref 24–37)

## 2014-07-18 SURGERY — OPEN REDUCTION INTERNAL FIXATION (ORIF) TIBIAL PLATEAU
Anesthesia: General | Site: Leg Lower | Laterality: Right

## 2014-07-18 MED ORDER — 0.9 % SODIUM CHLORIDE (POUR BTL) OPTIME
TOPICAL | Status: DC | PRN
  Administered 2014-07-18 (×2): 1000 mL

## 2014-07-18 MED ORDER — ONDANSETRON HCL 4 MG/2ML IJ SOLN
INTRAMUSCULAR | Status: DC | PRN
  Administered 2014-07-18: 4 mg via INTRAVENOUS

## 2014-07-18 MED ORDER — MIDAZOLAM HCL 2 MG/2ML IJ SOLN
0.5000 mg | Freq: Once | INTRAMUSCULAR | Status: AC | PRN
  Administered 2014-07-18: 2 mg via INTRAVENOUS

## 2014-07-18 MED ORDER — INSULIN ASPART 100 UNIT/ML ~~LOC~~ SOLN: 0.0000 [IU] | Freq: Every day | SUBCUTANEOUS | Status: DC

## 2014-07-18 MED ORDER — CLINDAMYCIN PHOSPHATE 900 MG/50ML IV SOLN
900.0000 mg | INTRAVENOUS | Status: AC
  Administered 2014-07-18: 900 mg via INTRAVENOUS

## 2014-07-18 MED ORDER — ONDANSETRON HCL 4 MG/2ML IJ SOLN
INTRAMUSCULAR | Status: AC
  Filled 2014-07-18: qty 2

## 2014-07-18 MED ORDER — FENTANYL CITRATE (PF) 100 MCG/2ML IJ SOLN
INTRAMUSCULAR | Status: DC | PRN
  Administered 2014-07-18: 50 ug via INTRAVENOUS
  Administered 2014-07-18: 25 ug via INTRAVENOUS
  Administered 2014-07-18: 100 ug via INTRAVENOUS
  Administered 2014-07-18: 50 ug via INTRAVENOUS
  Administered 2014-07-18: 100 ug via INTRAVENOUS
  Administered 2014-07-18 (×5): 50 ug via INTRAVENOUS
  Administered 2014-07-18: 150 ug via INTRAVENOUS

## 2014-07-18 MED ORDER — NALOXONE HCL 0.4 MG/ML IJ SOLN
0.4000 mg | INTRAMUSCULAR | Status: DC | PRN
  Filled 2014-07-18: qty 1

## 2014-07-18 MED ORDER — ROCURONIUM BROMIDE 50 MG/5ML IV SOLN
INTRAVENOUS | Status: AC
  Filled 2014-07-18: qty 1

## 2014-07-18 MED ORDER — INSULIN ASPART 100 UNIT/ML ~~LOC~~ SOLN: 4.0000 [IU] | Freq: Three times a day (TID) | SUBCUTANEOUS | Status: DC

## 2014-07-18 MED ORDER — PHENYLEPHRINE 40 MCG/ML (10ML) SYRINGE FOR IV PUSH (FOR BLOOD PRESSURE SUPPORT)
PREFILLED_SYRINGE | INTRAVENOUS | Status: AC
  Filled 2014-07-18: qty 10

## 2014-07-18 MED ORDER — PROPOFOL 10 MG/ML IV BOLUS
INTRAVENOUS | Status: AC
  Filled 2014-07-18: qty 20

## 2014-07-18 MED ORDER — CLINDAMYCIN PHOSPHATE 600 MG/50ML IV SOLN
600.0000 mg | Freq: Four times a day (QID) | INTRAVENOUS | Status: AC
  Administered 2014-07-18 – 2014-07-19 (×3): 600 mg via INTRAVENOUS
  Filled 2014-07-18 (×3): qty 50

## 2014-07-18 MED ORDER — LACTATED RINGERS IV SOLN
INTRAVENOUS | Status: DC
  Administered 2014-07-18 (×3): via INTRAVENOUS

## 2014-07-18 MED ORDER — MIDAZOLAM HCL 5 MG/5ML IJ SOLN
INTRAMUSCULAR | Status: DC | PRN
  Administered 2014-07-18 (×2): 1 mg via INTRAVENOUS

## 2014-07-18 MED ORDER — ACETAMINOPHEN 10 MG/ML IV SOLN
INTRAVENOUS | Status: DC | PRN
  Administered 2014-07-18: 1000 mg via INTRAVENOUS

## 2014-07-18 MED ORDER — KETOROLAC TROMETHAMINE 15 MG/ML IJ SOLN
15.0000 mg | Freq: Four times a day (QID) | INTRAMUSCULAR | Status: AC
  Administered 2014-07-18 – 2014-07-20 (×8): 15 mg via INTRAVENOUS
  Filled 2014-07-18 (×7): qty 1

## 2014-07-18 MED ORDER — EPHEDRINE SULFATE 50 MG/ML IJ SOLN
INTRAMUSCULAR | Status: AC
  Filled 2014-07-18: qty 1

## 2014-07-18 MED ORDER — POTASSIUM CHLORIDE IN NACL 20-0.9 MEQ/L-% IV SOLN
INTRAVENOUS | Status: DC
  Administered 2014-07-18: 21:00:00 via INTRAVENOUS
  Filled 2014-07-18 (×3): qty 1000

## 2014-07-18 MED ORDER — CHLORHEXIDINE GLUCONATE 4 % EX LIQD
60.0000 mL | Freq: Once | CUTANEOUS | Status: DC
  Filled 2014-07-18: qty 60

## 2014-07-18 MED ORDER — ENOXAPARIN SODIUM 40 MG/0.4ML ~~LOC~~ SOLN
40.0000 mg | SUBCUTANEOUS | Status: DC
  Administered 2014-07-19 – 2014-07-20 (×2): 40 mg via SUBCUTANEOUS
  Filled 2014-07-18 (×2): qty 0.4

## 2014-07-18 MED ORDER — FENTANYL CITRATE (PF) 250 MCG/5ML IJ SOLN
INTRAMUSCULAR | Status: AC
  Filled 2014-07-18: qty 5

## 2014-07-18 MED ORDER — MIDAZOLAM HCL 2 MG/2ML IJ SOLN
INTRAMUSCULAR | Status: AC
  Filled 2014-07-18: qty 2

## 2014-07-18 MED ORDER — DIPHENHYDRAMINE HCL 50 MG/ML IJ SOLN
12.5000 mg | Freq: Four times a day (QID) | INTRAMUSCULAR | Status: DC | PRN
  Filled 2014-07-18: qty 0.25

## 2014-07-18 MED ORDER — DOCUSATE SODIUM 100 MG PO CAPS
100.0000 mg | ORAL_CAPSULE | Freq: Two times a day (BID) | ORAL | Status: DC
  Filled 2014-07-18 (×2): qty 1

## 2014-07-18 MED ORDER — OXYCODONE HCL 5 MG PO TABS
5.0000 mg | ORAL_TABLET | ORAL | Status: DC | PRN
  Administered 2014-07-18: 10 mg via ORAL
  Administered 2014-07-19 (×2): 15 mg via ORAL
  Administered 2014-07-19 (×2): 10 mg via ORAL
  Administered 2014-07-19 – 2014-07-20 (×3): 15 mg via ORAL
  Administered 2014-07-20: 10 mg via ORAL
  Filled 2014-07-18 (×4): qty 3
  Filled 2014-07-18 (×3): qty 2
  Filled 2014-07-18 (×2): qty 3

## 2014-07-18 MED ORDER — GLYCOPYRROLATE 0.2 MG/ML IJ SOLN
INTRAMUSCULAR | Status: AC
  Filled 2014-07-18: qty 2

## 2014-07-18 MED ORDER — ACETAMINOPHEN 650 MG RE SUPP: 650.0000 mg | Freq: Four times a day (QID) | RECTAL | Status: DC | PRN

## 2014-07-18 MED ORDER — ACETAMINOPHEN 325 MG PO TABS
650.0000 mg | ORAL_TABLET | Freq: Four times a day (QID) | ORAL | Status: DC | PRN
  Administered 2014-07-20: 650 mg via ORAL
  Filled 2014-07-18: qty 2

## 2014-07-18 MED ORDER — PROPOFOL 10 MG/ML IV BOLUS
INTRAVENOUS | Status: DC | PRN
  Administered 2014-07-18: 50 mg via INTRAVENOUS
  Administered 2014-07-18: 150 mg via INTRAVENOUS

## 2014-07-18 MED ORDER — MEPERIDINE HCL 25 MG/ML IJ SOLN: 6.2500 mg | INTRAMUSCULAR | Status: DC | PRN

## 2014-07-18 MED ORDER — VITAMIN D (ERGOCALCIFEROL) 1.25 MG (50000 UNIT) PO CAPS
50000.0000 [IU] | ORAL_CAPSULE | ORAL | Status: DC
Start: 2014-07-19 — End: 2014-07-20
  Administered 2014-07-19: 50000 [IU] via ORAL
  Filled 2014-07-18: qty 1

## 2014-07-18 MED ORDER — INSULIN ASPART 100 UNIT/ML ~~LOC~~ SOLN
0.0000 [IU] | Freq: Three times a day (TID) | SUBCUTANEOUS | Status: DC
  Administered 2014-07-19: 2 [IU] via SUBCUTANEOUS
  Administered 2014-07-20: 5 [IU] via SUBCUTANEOUS

## 2014-07-18 MED ORDER — SUCCINYLCHOLINE CHLORIDE 20 MG/ML IJ SOLN
INTRAMUSCULAR | Status: AC
  Filled 2014-07-18: qty 1

## 2014-07-18 MED ORDER — INSULIN GLARGINE 100 UNIT/ML ~~LOC~~ SOLN
20.0000 [IU] | Freq: Every day | SUBCUTANEOUS | Status: DC
  Administered 2014-07-18 – 2014-07-19 (×2): 20 [IU] via SUBCUTANEOUS
  Filled 2014-07-18 (×3): qty 0.2

## 2014-07-18 MED ORDER — METHOCARBAMOL 1000 MG/10ML IJ SOLN
1000.0000 mg | Freq: Four times a day (QID) | INTRAVENOUS | Status: DC | PRN
  Filled 2014-07-18 (×2): qty 10

## 2014-07-18 MED ORDER — CLINDAMYCIN PHOSPHATE 900 MG/50ML IV SOLN
INTRAVENOUS | Status: AC
  Filled 2014-07-18: qty 50

## 2014-07-18 MED ORDER — ONDANSETRON HCL 4 MG/2ML IJ SOLN
4.0000 mg | Freq: Four times a day (QID) | INTRAMUSCULAR | Status: DC | PRN
  Filled 2014-07-18: qty 2

## 2014-07-18 MED ORDER — HYDROMORPHONE HCL 1 MG/ML IJ SOLN
INTRAMUSCULAR | Status: AC
  Filled 2014-07-18: qty 1

## 2014-07-18 MED ORDER — DIPHENHYDRAMINE HCL 12.5 MG/5ML PO ELIX
12.5000 mg | ORAL_SOLUTION | Freq: Four times a day (QID) | ORAL | Status: DC | PRN
  Filled 2014-07-18: qty 5

## 2014-07-18 MED ORDER — BUPIVACAINE LIPOSOME 1.3 % IJ SUSP
20.0000 mL | Freq: Once | INTRAMUSCULAR | Status: DC
  Filled 2014-07-18: qty 20

## 2014-07-18 MED ORDER — PHENYLEPHRINE HCL 10 MG/ML IJ SOLN
INTRAMUSCULAR | Status: DC | PRN
  Administered 2014-07-18: 40 ug via INTRAVENOUS
  Administered 2014-07-18: 80 ug via INTRAVENOUS
  Administered 2014-07-18: 40 ug via INTRAVENOUS
  Administered 2014-07-18: 80 ug via INTRAVENOUS

## 2014-07-18 MED ORDER — ONDANSETRON HCL 4 MG/2ML IJ SOLN
4.0000 mg | Freq: Four times a day (QID) | INTRAMUSCULAR | Status: DC | PRN
  Administered 2014-07-20: 4 mg via INTRAVENOUS
  Filled 2014-07-18: qty 2

## 2014-07-18 MED ORDER — MAGNESIUM HYDROXIDE 400 MG/5ML PO SUSP: 30.0000 mL | Freq: Every day | ORAL | Status: DC | PRN

## 2014-07-18 MED ORDER — MAGNESIUM CITRATE PO SOLN: 1.0000 | Freq: Once | ORAL | Status: AC | PRN

## 2014-07-18 MED ORDER — ROCURONIUM BROMIDE 100 MG/10ML IV SOLN
INTRAVENOUS | Status: DC | PRN
  Administered 2014-07-18: 50 mg via INTRAVENOUS
  Administered 2014-07-18 (×2): 20 mg via INTRAVENOUS

## 2014-07-18 MED ORDER — METOCLOPRAMIDE HCL 5 MG PO TABS: 5.0000 mg | ORAL_TABLET | Freq: Three times a day (TID) | ORAL | Status: DC | PRN

## 2014-07-18 MED ORDER — MORPHINE SULFATE (PF) 1 MG/ML IV SOLN
INTRAVENOUS | Status: AC
  Filled 2014-07-18: qty 25

## 2014-07-18 MED ORDER — LIDOCAINE HCL (CARDIAC) 20 MG/ML IV SOLN
INTRAVENOUS | Status: DC | PRN
  Administered 2014-07-18: 30 mg via INTRAVENOUS

## 2014-07-18 MED ORDER — SODIUM CHLORIDE 0.9 % IJ SOLN: 9.0000 mL | INTRAMUSCULAR | Status: DC | PRN

## 2014-07-18 MED ORDER — GLYCOPYRROLATE 0.2 MG/ML IJ SOLN
INTRAMUSCULAR | Status: DC | PRN
  Administered 2014-07-18: 0.4 mg via INTRAVENOUS

## 2014-07-18 MED ORDER — VITAMIN D 1000 UNITS PO TABS
1000.0000 [IU] | ORAL_TABLET | Freq: Every day | ORAL | Status: DC
  Administered 2014-07-18 – 2014-07-19 (×2): 1000 [IU] via ORAL
  Filled 2014-07-18 (×3): qty 1

## 2014-07-18 MED ORDER — MORPHINE SULFATE (PF) 1 MG/ML IV SOLN
INTRAVENOUS | Status: DC
  Administered 2014-07-18: 9 mg via INTRAVENOUS
  Administered 2014-07-18: 13:00:00 via INTRAVENOUS
  Administered 2014-07-18: 3 mg via INTRAVENOUS
  Administered 2014-07-19: 1 mg via INTRAVENOUS
  Administered 2014-07-19: 6 mg via INTRAVENOUS
  Administered 2014-07-19: 7 mg via INTRAVENOUS
  Administered 2014-07-19: 3 mg via INTRAVENOUS
  Administered 2014-07-19 (×3): via INTRAVENOUS
  Administered 2014-07-20: 3 mg via INTRAVENOUS
  Filled 2014-07-18: qty 25

## 2014-07-18 MED ORDER — DIPHENHYDRAMINE HCL 12.5 MG/5ML PO ELIX: 12.5000 mg | ORAL_SOLUTION | ORAL | Status: DC | PRN

## 2014-07-18 MED ORDER — SODIUM CHLORIDE 0.9 % IJ SOLN
INTRAMUSCULAR | Status: AC
  Filled 2014-07-18: qty 10

## 2014-07-18 MED ORDER — NEOSTIGMINE METHYLSULFATE 10 MG/10ML IV SOLN
INTRAVENOUS | Status: AC
  Filled 2014-07-18: qty 1

## 2014-07-18 MED ORDER — HYDROMORPHONE HCL 1 MG/ML IJ SOLN
0.2500 mg | INTRAMUSCULAR | Status: DC | PRN
  Administered 2014-07-18: 0.5 mg via INTRAVENOUS
  Administered 2014-07-18: 1 mg via INTRAVENOUS
  Administered 2014-07-18: 0.5 mg via INTRAVENOUS

## 2014-07-18 MED ORDER — NEOSTIGMINE METHYLSULFATE 10 MG/10ML IV SOLN
INTRAVENOUS | Status: DC | PRN
  Administered 2014-07-18: 3 mg via INTRAVENOUS

## 2014-07-18 MED ORDER — GABAPENTIN 300 MG PO CAPS
300.0000 mg | ORAL_CAPSULE | Freq: Two times a day (BID) | ORAL | Status: DC
  Administered 2014-07-18 – 2014-07-20 (×4): 300 mg via ORAL
  Filled 2014-07-18 (×4): qty 1

## 2014-07-18 MED ORDER — METOCLOPRAMIDE HCL 5 MG/ML IJ SOLN: 5.0000 mg | Freq: Three times a day (TID) | INTRAMUSCULAR | Status: DC | PRN

## 2014-07-18 MED ORDER — ONDANSETRON HCL 4 MG PO TABS: 4.0000 mg | ORAL_TABLET | Freq: Four times a day (QID) | ORAL | Status: DC | PRN

## 2014-07-18 MED ORDER — ACETAMINOPHEN 10 MG/ML IV SOLN
INTRAVENOUS | Status: AC
  Filled 2014-07-18: qty 100

## 2014-07-18 MED ORDER — METHOCARBAMOL 500 MG PO TABS
500.0000 mg | ORAL_TABLET | Freq: Four times a day (QID) | ORAL | Status: DC | PRN
  Administered 2014-07-18 – 2014-07-20 (×7): 1000 mg via ORAL
  Filled 2014-07-18 (×8): qty 2

## 2014-07-18 MED ORDER — BUPIVACAINE LIPOSOME 1.3 % IJ SUSP
INTRAMUSCULAR | Status: DC | PRN
  Administered 2014-07-18: 20 mL

## 2014-07-18 MED ORDER — HYDROMORPHONE HCL 1 MG/ML IJ SOLN
INTRAMUSCULAR | Status: AC
  Administered 2014-07-18: 0.5 mg via INTRAVENOUS
  Filled 2014-07-18: qty 1

## 2014-07-18 MED ORDER — BISACODYL 5 MG PO TBEC: 5.0000 mg | DELAYED_RELEASE_TABLET | Freq: Every day | ORAL | Status: DC | PRN

## 2014-07-18 MED ORDER — LIDOCAINE HCL (CARDIAC) 20 MG/ML IV SOLN
INTRAVENOUS | Status: AC
  Filled 2014-07-18: qty 5

## 2014-07-18 MED ORDER — KETOROLAC TROMETHAMINE 15 MG/ML IJ SOLN
INTRAMUSCULAR | Status: AC
  Administered 2014-07-18: 15 mg via INTRAVENOUS
  Filled 2014-07-18: qty 1

## 2014-07-18 MED ORDER — PROMETHAZINE HCL 25 MG/ML IJ SOLN: 6.2500 mg | INTRAMUSCULAR | Status: DC | PRN

## 2014-07-18 MED ORDER — THYROID 30 MG PO TABS
150.0000 mg | ORAL_TABLET | Freq: Every day | ORAL | Status: DC
  Administered 2014-07-18 – 2014-07-20 (×3): 150 mg via ORAL
  Filled 2014-07-18 (×3): qty 1

## 2014-07-18 SURGICAL SUPPLY — 105 items
BANDAGE ELASTIC 4 VELCRO ST LF (GAUZE/BANDAGES/DRESSINGS) ×2 IMPLANT
BANDAGE ELASTIC 6 VELCRO ST LF (GAUZE/BANDAGES/DRESSINGS) ×2 IMPLANT
BANDAGE ESMARK 6X9 LF (GAUZE/BANDAGES/DRESSINGS) ×1 IMPLANT
BIT DRILL 100X2.5XANTM LCK (BIT) ×1 IMPLANT
BIT DRILL 2.5X110 QC LCP DISP (BIT) ×2 IMPLANT
BIT DRL 100X2.5XANTM LCK (BIT) ×1
BLADE SURG 10 STRL SS (BLADE) ×2 IMPLANT
BLADE SURG ROTATE 9660 (MISCELLANEOUS) IMPLANT
BNDG COHESIVE 4X5 TAN STRL (GAUZE/BANDAGES/DRESSINGS) IMPLANT
BNDG ESMARK 6X9 LF (GAUZE/BANDAGES/DRESSINGS) ×2
BNDG GAUZE ELAST 4 BULKY (GAUZE/BANDAGES/DRESSINGS) ×4 IMPLANT
BRUSH SCRUB DISP (MISCELLANEOUS) ×4 IMPLANT
CANISTER SUCT 3000ML PPV (MISCELLANEOUS) IMPLANT
COMPONENT TIB PRX PSTMD 3.5 2H (Orthopedic Implant) ×1 IMPLANT
CONT SPEC 4OZ CLIKSEAL STRL BL (MISCELLANEOUS) ×2 IMPLANT
COVER MAYO STAND STRL (DRAPES) IMPLANT
COVER SURGICAL LIGHT HANDLE (MISCELLANEOUS) ×4 IMPLANT
CUFF TOURNIQUET SINGLE 34IN LL (TOURNIQUET CUFF) ×2 IMPLANT
DRAPE C-ARM 42X72 X-RAY (DRAPES) ×2 IMPLANT
DRAPE C-ARMOR (DRAPES) ×2 IMPLANT
DRAPE EXTREMITY T 121X128X90 (DRAPE) ×2 IMPLANT
DRAPE INCISE IOBAN 66X45 STRL (DRAPES) ×2 IMPLANT
DRAPE ORTHO SPLIT 77X108 STRL (DRAPES) ×1
DRAPE PROXIMA HALF (DRAPES) ×2 IMPLANT
DRAPE SURG ORHT 6 SPLT 77X108 (DRAPES) ×1 IMPLANT
DRAPE U-SHAPE 47X51 STRL (DRAPES) ×2 IMPLANT
DRILL BIT 2.5MM (BIT) ×1
DRILL BIT 2.7X100 214235006 DU (MISCELLANEOUS) ×2 IMPLANT
DRSG ADAPTIC 3X8 NADH LF (GAUZE/BANDAGES/DRESSINGS) IMPLANT
DRSG MEPITEL 4X7.2 (GAUZE/BANDAGES/DRESSINGS) ×4 IMPLANT
DRSG PAD ABDOMINAL 8X10 ST (GAUZE/BANDAGES/DRESSINGS) ×6 IMPLANT
ELECT REM PT RETURN 9FT ADLT (ELECTROSURGICAL) ×2
ELECTRODE REM PT RTRN 9FT ADLT (ELECTROSURGICAL) ×1 IMPLANT
EVACUATOR 1/8 PVC DRAIN (DRAIN) IMPLANT
EVACUATOR 3/16  PVC DRAIN (DRAIN)
EVACUATOR 3/16 PVC DRAIN (DRAIN) IMPLANT
GAUZE SPONGE 4X4 12PLY STRL (GAUZE/BANDAGES/DRESSINGS) ×4 IMPLANT
GLOVE BIO SURGEON STRL SZ7.5 (GLOVE) ×6 IMPLANT
GLOVE BIO SURGEON STRL SZ8 (GLOVE) ×6 IMPLANT
GLOVE BIOGEL PI IND STRL 6.5 (GLOVE) ×2 IMPLANT
GLOVE BIOGEL PI IND STRL 7.5 (GLOVE) ×1 IMPLANT
GLOVE BIOGEL PI IND STRL 8 (GLOVE) ×2 IMPLANT
GLOVE BIOGEL PI INDICATOR 6.5 (GLOVE) ×2
GLOVE BIOGEL PI INDICATOR 7.5 (GLOVE) ×1
GLOVE BIOGEL PI INDICATOR 8 (GLOVE) ×2
GLOVE SURG SS PI 6.0 STRL IVOR (GLOVE) ×2 IMPLANT
GOWN STRL REUS W/ TWL LRG LVL3 (GOWN DISPOSABLE) ×4 IMPLANT
GOWN STRL REUS W/ TWL XL LVL3 (GOWN DISPOSABLE) ×2 IMPLANT
GOWN STRL REUS W/TWL LRG LVL3 (GOWN DISPOSABLE) ×4
GOWN STRL REUS W/TWL XL LVL3 (GOWN DISPOSABLE) ×2
IMMOBILIZER KNEE 22 UNIV (SOFTGOODS) IMPLANT
K-WIRE ACE 1.6X6 (WIRE) ×8
KIT BASIN OR (CUSTOM PROCEDURE TRAY) ×2 IMPLANT
KIT ROOM TURNOVER OR (KITS) ×2 IMPLANT
KWIRE ACE 1.6X6 (WIRE) ×4 IMPLANT
MANIFOLD NEPTUNE WASTE (CANNULA) ×2 IMPLANT
NDL SUT 6 .5 CRC .975X.05 MAYO (NEEDLE) ×1 IMPLANT
NEEDLE 18GX1X1/2 (RX/OR ONLY) (NEEDLE) ×2 IMPLANT
NEEDLE 22X1 1/2 (OR ONLY) (NEEDLE) ×2 IMPLANT
NEEDLE MAYO TAPER (NEEDLE) ×1
NS IRRIG 1000ML POUR BTL (IV SOLUTION) ×4 IMPLANT
PACK ORTHO EXTREMITY (CUSTOM PROCEDURE TRAY) ×2 IMPLANT
PAD ARMBOARD 7.5X6 YLW CONV (MISCELLANEOUS) ×2 IMPLANT
PAD CAST 4YDX4 CTTN HI CHSV (CAST SUPPLIES) ×1 IMPLANT
PADDING CAST COTTON 4X4 STRL (CAST SUPPLIES) ×1
PADDING CAST COTTON 6X4 STRL (CAST SUPPLIES) ×2 IMPLANT
PLATE LOCK 5H STD RT PROX TIB (Plate) ×2 IMPLANT
SCREW CORT 3.5X30 815037030 (Screw) ×2 IMPLANT
SCREW CORTEX 3.5 36MM (Screw) ×1 IMPLANT
SCREW CORTEX 3.5 40MM (Screw) ×1 IMPLANT
SCREW CORTEX 3.5 45MM (Screw) ×2 IMPLANT
SCREW CORTEX 3.5 50MM (Screw) ×2 IMPLANT
SCREW CORTICAL 3.5MM 36MM (Screw) ×2 IMPLANT
SCREW LOCK CORT ST 3.5X36 (Screw) ×1 IMPLANT
SCREW LOCK CORT ST 3.5X40 (Screw) ×1 IMPLANT
SCREW LOCK CORT STAR 3.5X30 (Screw) ×2 IMPLANT
SCREW LOCK CORT STAR 3.5X60 (Screw) ×10 IMPLANT
SCREW LOCK CORT STAR 3.5X70 (Screw) ×2 IMPLANT
SCREW LP 3.5X75MM (Screw) ×2 IMPLANT
SCRUB POVIDONE IODINE 4 OZ (MISCELLANEOUS) ×2 IMPLANT
SOL PREP POV-IOD 4OZ 10% (MISCELLANEOUS) ×2 IMPLANT
SPONGE LAP 18X18 X RAY DECT (DISPOSABLE) ×6 IMPLANT
STAPLER VISISTAT 35W (STAPLE) ×2 IMPLANT
STOCKINETTE IMPERVIOUS LG (DRAPES) ×2 IMPLANT
SUCTION FRAZIER TIP 10 FR DISP (SUCTIONS) ×2 IMPLANT
SUT ETHILON 3 0 PS 1 (SUTURE) ×6 IMPLANT
SUT PROLENE 0 CT 1 30 (SUTURE) ×2 IMPLANT
SUT PROLENE 0 CT 2 (SUTURE) IMPLANT
SUT PROLENE 6 0 C 1 24 (SUTURE) ×2 IMPLANT
SUT VIC AB 0 CT1 27 (SUTURE) ×2
SUT VIC AB 0 CT1 27XBRD ANBCTR (SUTURE) ×2 IMPLANT
SUT VIC AB 1 CT1 27 (SUTURE) ×1
SUT VIC AB 1 CT1 27XBRD ANBCTR (SUTURE) ×1 IMPLANT
SUT VIC AB 2-0 CT1 27 (SUTURE) ×2
SUT VIC AB 2-0 CT1 TAPERPNT 27 (SUTURE) ×2 IMPLANT
SYR 20ML ECCENTRIC (SYRINGE) IMPLANT
SYR BULB IRRIGATION 50ML (SYRINGE) ×2 IMPLANT
SYR CONTROL 10ML LL (SYRINGE) ×2 IMPLANT
TIBIA PROX POSTMED LCP 3.5 2H (Orthopedic Implant) ×2 IMPLANT
TOWEL OR 17X24 6PK STRL BLUE (TOWEL DISPOSABLE) ×2 IMPLANT
TOWEL OR 17X26 10 PK STRL BLUE (TOWEL DISPOSABLE) ×4 IMPLANT
TRAY FOLEY W/METER SILVER 16FR (SET/KITS/TRAYS/PACK) ×2 IMPLANT
TUBE CONNECTING 12X1/4 (SUCTIONS) ×2 IMPLANT
WATER STERILE IRR 1000ML POUR (IV SOLUTION) IMPLANT
YANKAUER SUCT BULB TIP NO VENT (SUCTIONS) ×4 IMPLANT

## 2014-07-18 NOTE — Progress Notes (Signed)
Utilization review completed.  

## 2014-07-18 NOTE — Anesthesia Procedure Notes (Signed)
Procedure Name: Intubation Date/Time: 07/18/2014 8:12 AM Performed by: Orlinda BlalockMCMILLEN, Ambry Dix L Pre-anesthesia Checklist: Patient identified, Emergency Drugs available, Suction available, Patient being monitored and Timeout performed Patient Re-evaluated:Patient Re-evaluated prior to inductionOxygen Delivery Method: Circle system utilized Preoxygenation: Pre-oxygenation with 100% oxygen Intubation Type: IV induction Ventilation: Mask ventilation without difficulty Laryngoscope Size: Mac and 3 Grade View: Grade I Tube type: Oral Tube size: 7.0 mm Number of attempts: 1 Airway Equipment and Method: Stylet Placement Confirmation: ETT inserted through vocal cords under direct vision and breath sounds checked- equal and bilateral Secured at: 18 cm Tube secured with: Tape Dental Injury: Teeth and Oropharynx as per pre-operative assessment
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
## 2014-07-18 NOTE — Op Note (Signed)
Autumn Daniels, Autumn Daniels                 ACCOUNT NO.:  [id number]  MEDICAL RECORD NO.:  [id number]  LOCATION:  5N18C                        FACILITY:  [facility]  PHYSICIAN:  Doralee Albino. Carola Frost, M.D. DATE OF BIRTH:  Sep 20, 1960  DATE OF PROCEDURE:  07/18/2014 DATE OF DISCHARGE:                              OPERATIVE REPORT   PREOPERATIVE DIAGNOSES: 1. Right bicondylar tibial plateau fracture. 2. Retained external fixator. 3. Ulcerated pin sites.  POSTOPERATIVE DIAGNOSES: 1. Right bicondylar tibial plateau fracture. 2. Retained external fixator. 3. Ulcerated pin sites. 4. Avulsed anterior horn of the lateral meniscus.  PROCEDURES: 1. Open reduction and internal fixation of right bicondylar tibial     plateau fracture using medial and lateral approaches. 2. Repair of lateral meniscus capsular avulsion anterior horn. 3. Removal of external fixator under anesthesia. 4. Anterior compartment fasciotomy. 5. Debridement of ulcerative pin sites.  SURGEON:  Doralee Albino. Carola Frost, M.D.  ASSISTANT:  Mearl Latin, PA-C.  ANESTHESIA:  General.  COMPLICATIONS:  None.  TOURNIQUET:  90 minutes at 3 mmHg.  DISPOSITION:  To PACU.  CONDITION:  Stable.  BRIEF SUMMARY OF PROCEDURE:  Autumn Daniels is a 54 year old female who sustained a severely comminuted bicondylar fracture treated with spanning external fixation by Dr. Fuller Canada in [place].  We saw and evaluated her and followup at that time, she did have some loss of length and consequently taken back to the OR for revision fixation with supplemental bars and clamps to obtain better control.  This was 10 days prior to most recent evaluation when she was found to have adequate swelling, resolution and soft tissues for definitive repair.  I did discuss with her the risks and benefits of the surgery include the possibility of infection, nerve injury, vessel injury, DVT, PE, heart attack, stroke, loss of motion, arthritis, failure to prevent the  pin tract infection, as they were already ulcerated and had been treated with antibiotics with some improvement.  She acknowledges these risks and did wish to proceed.  BRIEF SUMMARY OF PROCEDURE:  Autumn Daniels received clindamycin preoperatively, taken to the operating room where general anesthesia was induced.  Her right lower extremity was prepped and draped in usual sterile fashion after first removing the fixator after she got under anesthesia.  I used the largest curette to remove the very superficial debris and then did a chlorhexidine scrub.  This was followed by a standard Betadine scrub and paint.  With application of additional extremity drape, this was then followed by a more formal and aggressive debridement of the ulcerated pin sites removing all the necrotic and fibrinous debris lying the pin tract and then exchanging curettes and using a smaller caliber curette to partially excise the near cortex to bone tract.  Wounds were irrigated thoroughly and a new gloves placed as well as the contaminated drape removed.  The pin sites were then prepped out of the field using a lap sponge and Ioban.  Autumn Morita, PA-C assisted me throughout to expedite the procedure by working simultaneously.  We then made a medial approach through a 6 cm incision, carried dissection carefully down, be cognizant of the saphenous vein.  I did mobilize 1 of  the larger branches posteriorly and approached the posterior medial cortex, identified the fracture site and kept the periosteum attached proximally, but placed the plate directly over this. The more distal shaft was subperiosteal identifying the fracture site, placing the Freer to help mobilize this into reduced position and the most important feature being extension and external rotation applied by my assistant.  In order to reduce it, a buttress plate was placed posteromedially using the Synthes system and 3 screws were optimally placed into  this.  No fixation into the proximal fragment as it was again a buttress.  Next, an anterolateral incision was made to perform an arthrotomy proximally.  The coronary ligament was incised, but even prior to doing that could be seen that the anterior attachment of the lateral meniscus to the capsule had been disrupted and the fracture line was immediately underneath this along with some impaction of the lateral plateau articular surface.  This was also present just along the medial border underneath the eminence and taking a tamp, I was able to push this back up into the correct and reduced position.  It was pinned provisionally along the lateral plateau from front to back and then this segment was folded over from lateral to medial plate and pinned provisionally there.  I was checked under multiple views, lateral plate applied, and use of the medial and lateral plates to obtain compression using the Darrick PennaKing Tong device with my assistant pulling traction and reducing the angulation on the AP.  Standard fixation was placed first and then locked fixation using the 2 rafter arose proximally.  I did not place any additional kick stand screws because of the posteromedial buttress and to avoid the fracture becoming too stiff through the metadiaphysis.  distally, 2 bicortical screws and lock screw were placed.  The wound was irrigated thoroughly.  At the distal edge of the incision, to reduce the chance of postoperative compartment syndrome, a Metzenbaum scissor was then used to spread both superficial and deep to the anterior compartment fascia with the scissor tips pointed away from the nerve.  It was split 10 cm distal to the incision.  The meniscus was repaired back with 0 Prolene using vertical mattress sutures on the outside of the capsule.  A #1 Vicryl for the arthrotomy and 0 Vicryl for the deep fat 2-0 Vicryl and a 3-0 nylon.  Sterile gently compressive dressing was applied.  I did x-ray the  contralateral knee during the surgery to assist with gauging reduction as she did have some preexistent atypical morphology.  Very careful to evaluate clinical alignment as well, both of which appeared appropriate.  Autumn MoritaKeith Paul, PA-C again assisted me throughout as noted above.  PROGNOSIS:  Autumn Daniels be nonweightbearing with unrestricted range of motion.  She will be non-weightbearing for 8 weeks.  She will be on DVT prophylaxis with Lovenox, possibly came in if she elects for this with mechanical prophylaxis while in the hospital.  She is at increased risk for arthritis and loss of motion, but adequately medicated with aggressive therapy.     Doralee AlbinoMichael H. Carola FrostHandy, M.D.     [facility]/MEDQ  D:  07/18/2014  T:  07/18/2014  Job:  [id number]

## 2014-07-18 NOTE — Progress Notes (Signed)
I have seen and examined the patient. I agree with the H&P interval above.  I discussed with the patient the risks and benefits of surgery for right tibial plateau, including the possibility of infection, nerve injury, vessel injury, wound breakdown, arthritis, symptomatic hardware, DVT/ PE, loss of motion, and need for further surgery among others.  She understood these risks and wished to proceed.   Budd PalmerHANDY,Kazia Grisanti H, MD 07/18/2014 7:51 AM

## 2014-07-18 NOTE — Transfer of Care (Signed)
Immediate Anesthesia Transfer of Care Note  Patient: [name]  Procedure(s) Performed: Procedure(s) with comments: RIGHT OPEN REDUCTION INTERNAL FIXATION (ORIF) TIBIA FRACTURE (Right) - ORIF of bicondylar plateau fracture REMOVAL EXTERNAL FIXATION LEG (Right)  Patient Location: PACU  Anesthesia Type:General  Level of Consciousness: awake, alert  and oriented  Airway & Oxygen Therapy: Patient Spontanous Breathing and Patient connected to face mask oxygen  Post-op Assessment: Report given to RN and Post -op Vital signs reviewed and stable  Post vital signs: Reviewed and stable  Last Vitals:  Filed Vitals:   07/18/14 0640  BP: 132/58  Pulse: 75  Temp: 36.7 C  Resp: 18    Complications: No apparent anesthesia complications

## 2014-07-18 NOTE — Progress Notes (Signed)
Patient does not want to be bothered. Very flat affect. Ben from [employer] attempted to fit patient with a brace and she refused, asking that he return tomorrow.

## 2014-07-18 NOTE — Progress Notes (Signed)
Total assessment was not possible due to patient non-compliance and not wanting to be touched at this time.

## 2014-07-18 NOTE — Progress Notes (Signed)
Orthopedic Tech Progress Note Patient Details:  Autumn CurlingMary Daniels 08/19/1960 [id number]  Ortho Devices Ortho Device/Splint Location: applied overhead frame to bed Ortho Device/Splint Interventions: Ordered, Application   Jennye MoccasinHughes, [name] 07/18/2014, 3:46 PM

## 2014-07-18 NOTE — Anesthesia Postprocedure Evaluation (Signed)
  Anesthesia Post-op Note  Patient: Autumn CurlingMary Daniels  Procedure(s) Performed: Procedure(s) with comments: RIGHT OPEN REDUCTION INTERNAL FIXATION (ORIF) TIBIA FRACTURE (Right) - ORIF of bicondylar plateau fracture REMOVAL EXTERNAL FIXATION LEG (Right)  Patient Location: PACU  Anesthesia Type:General  Level of Consciousness: awake, alert , oriented and patient cooperative  Airway and Oxygen Therapy: Patient Spontanous Breathing and Patient connected to nasal cannula oxygen  Post-op Pain: mild  Post-op Assessment: Post-op Vital signs reviewed, Patient's Cardiovascular Status Stable, Respiratory Function Stable, Patent Airway, No signs of Nausea or vomiting and Pain level controlled  Post-op Vital Signs: Reviewed and stable  Last Vitals:  Filed Vitals:   07/18/14 1330  BP: 157/69  Pulse: 88  Temp:   Resp: 13    Complications: No apparent anesthesia complications

## 2014-07-18 NOTE — Brief Op Note (Signed)
07/18/2014  12:55 PM  PATIENT:  Mindi CurlingMary Remington  54 y.o. female  PRE-OPERATIVE DIAGNOSIS:   1. RIGHT BICONDYLAR TIBIAL PLATEAU FRACTURE 2. RETAINED EXTERNAL FIXATOR 3. ULCERATED PIN SITES  POST-OPERATIVE DIAGNOSIS:   1. RIGHT BICONDYLAR TIBIAL PLATEAU FRACTURE 2. RETAINED EXTERNAL FIXATOR 3. ULCERATED PIN SITES 4. AVULSED ANTERIOR HORN OF LATERAL MENISCUS  PROCEDURE:  Procedure(s) with comments: 1. OPEN REDUCTION INTERNAL FIXATION (ORIF) BICONDYLAR TIBIAL FRACTURE (Right), MEDIAL AND LATERAL APPROACHES 2. REPAIR LATERAL MENISCUS CAPSULAR SEPARATION ANTERIOR HORN 3. REMOVAL EXTERNAL FIXATION LEG (Right) 4. ANTERIOR COMPARTMENT FASCIOTOMY 5. DEBRIDEMENT OF ULCERATED PIN SITES  SURGEON:  Surgeon(s) and Role:    * Myrene GalasMichael Marlena Barbato, MD - Primary  PHYSICIAN ASSISTANT: Montez MoritaKeith Paul, PA-C  ANESTHESIA:   general  I/O:  Total I/O In: 2200 [I.V.:2200] Out: 750 [Urine:550; Blood:200]  SPECIMEN:  No Specimen  TOURNIQUET:   Total Tourniquet Time Documented: Thigh (Right) - 90 minutes Total: Thigh (Right) - 90 minutes   DICTATION: .Other Dictation: Dictation Number [id number]    f

## 2014-07-18 NOTE — Anesthesia Preprocedure Evaluation (Addendum)
Anesthesia Evaluation  Patient identified by MRN, date of birth, ID band Patient awake    Reviewed: Allergy & Precautions, NPO status , Patient's Chart, lab work & pertinent test results  History of Anesthesia Complications Negative for: history of anesthetic complications  Airway Mallampati: I  TM Distance: >3 FB Neck ROM: Full    Dental  (+) Dental Advisory Given   Pulmonary former smoker (recently quit),  breath sounds clear to auscultation        Cardiovascular - anginanegative cardio ROS  Rhythm:Regular Rate:Normal     Neuro/Psych negative neurological ROS     GI/Hepatic Neg liver ROS, GERD-  Medicated and Controlled,  Endo/Other  diabetes (glu 116), Insulin DependentSLE  Renal/GU negative Renal ROS     Musculoskeletal  (+) Fibromyalgia -, narcotic dependent  Abdominal   Peds  Hematology  (+) Blood dyscrasia (Hb 10.7), ,   Anesthesia Other Findings   Reproductive/Obstetrics                            Anesthesia Physical Anesthesia Plan  ASA: III  Anesthesia Plan: General   Post-op Pain Management:    Induction: Intravenous  Airway Management Planned: Oral ETT  Additional Equipment:   Intra-op Plan:   Post-operative Plan: Extubation in OR  Informed Consent: I have reviewed the patients History and Physical, chart, labs and discussed the procedure including the risks, benefits and alternatives for the proposed anesthesia with the patient or authorized representative who has indicated his/her understanding and acceptance.   Dental advisory given  Plan Discussed with: CRNA and Surgeon  Anesthesia Plan Comments: (Plan routine monitors, GETA)        Anesthesia Quick Evaluation

## 2014-07-18 NOTE — Progress Notes (Signed)
Orthopedic Tech Progress Note Patient Details:  Autumn Daniels 01-11-1961 [id number]  Patient ID: Autumn Daniels, female   DOB: 01-11-1961, 54 y.o.   MRN: [id number] Called in [facility]; spoke with Ferdinand LangoKanisha   Roshawn Ayala 07/18/2014, 4:32 PM

## 2014-07-19 ENCOUNTER — Encounter (HOSPITAL_COMMUNITY): Payer: Self-pay | Admitting: Orthopedic Surgery

## 2014-07-19 LAB — URINE CULTURE
Colony Count: NO GROWTH
Culture: NO GROWTH

## 2014-07-19 LAB — BASIC METABOLIC PANEL
ANION GAP: 7 (ref 5–15)
CO2: 28 mmol/L (ref 19–32)
Calcium: 9.3 mg/dL (ref 8.4–10.5)
Chloride: 101 mmol/L (ref 96–112)
Creatinine, Ser: 0.53 mg/dL (ref 0.50–1.10)
GLUCOSE: 103 mg/dL — AB (ref 70–99)
Potassium: 3.6 mmol/L (ref 3.5–5.1)
SODIUM: 136 mmol/L (ref 135–145)

## 2014-07-19 LAB — CBC
HEMATOCRIT: 26.1 % — AB (ref 36.0–46.0)
Hemoglobin: 8.4 g/dL — ABNORMAL LOW (ref 12.0–15.0)
MCH: 30.7 pg (ref 26.0–34.0)
MCHC: 32.2 g/dL (ref 30.0–36.0)
MCV: 95.3 fL (ref 78.0–100.0)
Platelets: 307 10*3/uL (ref 150–400)
RBC: 2.74 MIL/uL — ABNORMAL LOW (ref 3.87–5.11)
RDW: 14.4 % (ref 11.5–15.5)
WBC: 8.9 10*3/uL (ref 4.0–10.5)

## 2014-07-19 LAB — GLUCOSE, CAPILLARY
GLUCOSE-CAPILLARY: 115 mg/dL — AB (ref 70–99)
Glucose-Capillary: 118 mg/dL — ABNORMAL HIGH (ref 70–99)
Glucose-Capillary: 150 mg/dL — ABNORMAL HIGH (ref 70–99)
Glucose-Capillary: 157 mg/dL — ABNORMAL HIGH (ref 70–99)

## 2014-07-19 NOTE — Evaluation (Signed)
Occupational Therapy Evaluation Patient Details Name: Autumn Daniels MRN: [id number] DOB: 1960/11/26 Today's Date: 07/19/2014    History of Present Illness 54 year old female fell off of a step stool on 06/30/2014 and sustained a severe bicondylar right tibial plateau fracture. Patient was seen and evaluated at the [facility] where she had an external fixator. Patient discharged home using RW and w/c for mobility while NWB> RLE. 4/26 [facility] for [facility] of [facility] fixator and OPEN REDUCTION INTERNAL FIXATION (ORIF) BICONDYLAR TIBIAL FRACTURE (Right), MEDIAL AND LATERAL APPROACHES.    Clinical Impression   Patient independent prior to initial fall and mod I prior to this recent surgery. Patient currently functioning at an overall supervision> min guard assist level. Patient will benefit from acute OT to increase overall independence in the areas of ADLs, functional mobility, and overall safety in order to safely discharge home with intermittent supervision and assistance with IADLs. Patient reports that she has hired caregivers to assist with IADLs.      Follow Up Recommendations  No OT follow up;Supervision - Intermittent    Equipment Recommendations  3 in 1 bedside comode    Recommendations for Other Services  None at this time   Precautions / Restrictions Precautions Precautions: Fall Required Braces or Orthoses: Other Brace/Splint Other Brace/Splint: hinge brace ordered from outside vendor. No knee AROM restrictions Restrictions Weight Bearing Restrictions: Yes RLE Weight Bearing: Non weight bearing      Mobility Bed Mobility  See PT note  Transfers Overall transfer level: Needs assistance Equipment used: Rolling walker (2 wheeled) Transfers: Sit to/from Stand Sit to Stand: Supervision         General transfer comment: Supervision for safety during intial evaluation     Balance Overall balance assessment: Needs assistance Sitting-balance  support: No upper extremity supported;Feet unsupported Sitting balance-Leahy Scale: Good     Standing balance support: Bilateral upper extremity supported;During functional activity Standing balance-Leahy Scale: Fair     ADL Overall ADL's : Needs assistance/impaired General ADL Comments: Patient unable to reach RLE at this time due to decreased ROM, encouraged patient to work towards this and with HEP by PT, believe patient will be able to perform LB ADLs soon. Patient performed toilet transfer with min guard assist, functional ambulation with supervision. Patient adheres to NWB>RLE without cueing needed. Will follow patient acutely, do not believe patient will need follow-up OT post acute d/c.     Pertinent Vitals/Pain Pain Assessment: 0-10 Pain Score: 8  Pain Location: right knee Pain Descriptors / Indicators: Aching Pain Intervention(s): Monitored during session;Repositioned     Hand Dominance Right   Extremity/Trunk Assessment Upper Extremity Assessment Upper Extremity Assessment: Overall WFL for tasks assessed           Communication Communication Communication: No difficulties   Cognition Arousal/Alertness: Awake/alert Behavior During Therapy: WFL for tasks assessed/performed Overall Cognitive Status: Within Functional Limits for tasks assessed              Home Living Family/patient expects to be discharged to:: Private residence Living Arrangements: Alone Available Help at Discharge: Family;Personal care attendant Type of Home: House Home Access: Stairs to enter [employer] of Steps: 5 Entrance Stairs-Rails: Right Home Layout: One level     Bathroom Shower/Tub: Producer, television/film/video: Standard     Home Equipment: Shower seat - built in    Prior Functioning/Environment Level of Independence: Independent with assistive device(s)  Comments: Patient using RW and w/c for mobility after initial surgery  for external fixator    OT  Diagnosis: Generalized weakness   OT Problem List: Decreased strength;Decreased range of motion;Decreased activity tolerance;Impaired balance (sitting and/or standing);Decreased knowledge of use of DME or AE;Decreased knowledge of precautions;Pain   OT Treatment/Interventions: Self-care/ADL training;Therapeutic exercise;Energy conservation;DME and/or AE instruction;Therapeutic activities;Patient/family education;Balance training    OT Goals(Current goals can be found in the care plan section) Acute Rehab OT Goals Patient Stated Goal: go home OT Goal Formulation: With patient Time For Goal Achievement: 07/26/14 Potential to Achieve Goals: Good ADL Goals Pt Will Perform Grooming: with modified independence;standing Pt Will Perform Upper Body Bathing: Independently;sitting Pt Will Perform Lower Body Bathing: with modified independence;sit to/from stand;with adaptive equipment Pt Will Perform Upper Body Dressing: Independently;sitting Pt Will Perform Lower Body Dressing: with modified independence;sit to/from stand;with adaptive equipment Pt Will Transfer to Toilet: with modified independence;ambulating;bedside commode Pt Will Perform Toileting - Clothing Manipulation and hygiene: with modified independence;sit to/from stand Pt Will Perform Tub/Shower Transfer: Shower transfer;ambulating;shower seat;rolling walker;with modified independence Pt/caregiver will Perform Home Exercise Program: Increased strength;Both right and left upper extremity;Independently;With written HEP provided  OT Frequency: Min 2X/week   Barriers to D/C: Decreased caregiver support       Co-evaluation PT/OT/SLP Co-Evaluation/Treatment: Yes Reason for Co-Treatment: For patient/therapist safety PT goals addressed during session: Mobility/safety with mobility;Strengthening/ROM OT goals addressed during session: ADL's and self-care;Strengthening/ROM      End of Session Equipment Utilized During Treatment: Rolling  walker  Activity Tolerance: Patient tolerated treatment well Patient left: in chair;with call bell/phone within reach   Time: 1112-1141 OT Time Calculation (min): 29 min Charges:  OT General Charges $OT Visit: 1 Procedure OT Evaluation $Initial OT Evaluation Tier I: 1 Procedure  Jamille Fisher , MS, OTR/L, CLT Pager: [phone]  07/19/2014, 11:54 AM

## 2014-07-19 NOTE — Care Management Note (Signed)
CARE MANAGEMENT NOTE 07/19/2014  Patient:  Mindi CurlingOWELL,Dashley   Account Number:  [id number]  Date Initiated:  07/19/2014  Documentation initiated by:  Vance PeperBRADY,Ramond Darnell  Subjective/Objective Assessment:   54 yr old female admitted s/p fall with right tibial Fx. Patient is s/p removal of exfixator and Right tibia ORIF.     Action/Plan:   Case manager spoke with patient concerning home health and DME . Patient states she is setup with [facility], no changes. Also has private pay care. Pt has RW, needs 3in1.   Anticipated DC Date:  07/20/2014   Anticipated DC Plan:  HOME W HOME HEALTH SERVICES      DC Planning Services  CM consult      [facility]  DURABLE MEDICAL EQUIPMENT   Choice offered to / List presented to:  C-1 Patient   DME arranged  3-N-1      DME agency  [facility]     HH arranged  HH-2 PT      [facility]   Status of service:  Completed, signed off Medicare Important Message given?   (If response is "NO", the following Medicare IM given date fields will be blank) Date Medicare IM given:   Medicare IM given by:   Date Additional Medicare IM given:   Additional Medicare IM given by:    Discharge Disposition:  HOME W HOME HEALTH SERVICES  Per UR Regulation:  Reviewed for med. necessity/level of care/duration of stay

## 2014-07-19 NOTE — Progress Notes (Signed)
Spoke to Dr Magdalene PatriciaHandy"s PA, Josh, about augmenting morphine dose for PCA from reduce to full. He suggested to supplement with oxy IR instead and leave the morphine at reduce dose.

## 2014-07-19 NOTE — Evaluation (Signed)
Physical Therapy Evaluation Patient Details Name: Autumn CurlingMary Prevost MRN: [id number] DOB: [birth year] Today's Date: 07/19/2014   History of Present Illness  54 year old female fell off of a step stool on 06/30/2014 and sustained a severe bicondylar right tibial plateau fracture. Patient was seen and evaluated at the [facility] where she had an external fixator. Patient discharged home using RW and w/c for mobility while NWB> RLE. 4/26 [facility] for [facility] of [facility] fixator and OPEN REDUCTION INTERNAL FIXATION (ORIF) BICONDYLAR TIBIAL FRACTURE (Right), MEDIAL AND LATERAL APPROACHES.     Clinical Impression  Patient presents with pain and limited AROM/strength RLE s/p above surgery impacting mobility. Tolerated transfers and ambulation with Min-guard A-S for safety. Instructed pt in HEP to improve knee AROM. Pt has a PCA that helps with IADLs. Needs to practice stair negotiation next session prior to discharge. Would benefit from skilled PT to improve gait, balance, endurance and mobility so pt can maximize independence and minimize fall risk at home.    Follow Up Recommendations Home health PT;Supervision/Assistance - 24 hour    Equipment Recommendations  None recommended by PT    Recommendations for Other Services OT consult     Precautions / Restrictions Precautions Precautions: Fall Required Braces or Orthoses: Other Brace/Splint Other Brace/Splint: hinge brace ordered from outside vendor. No knee AROM restrictions Restrictions Weight Bearing Restrictions: Yes RLE Weight Bearing: Non weight bearing      Mobility  Bed Mobility Overal bed mobility: Needs Assistance Bed Mobility: Supine to Sit     Supine to sit: Supervision;HOB elevated     General bed mobility comments: ABle to bring RLE to EOB without assist.   Transfers Overall transfer level: Needs assistance Equipment used: Rolling walker (2 wheeled) Transfers: Sit to/from Stand Sit to Stand:  Supervision Stand pivot transfers: Supervision       General transfer comment: Supervision for safety. [facility] Compliant with NWB status RLE.  Ambulation/Gait Ambulation/Gait assistance: Min guard Ambulation Distance (Feet): 30 Feet Assistive device: Rolling walker (2 wheeled) Gait Pattern/deviations: Step-to pattern;Decreased step length - left;Trunk flexed ("hop to" pattern) Gait velocity: decreased   General Gait Details: Cues to take longer strides for energy conservation. Compliant with NWB RLE. Pt needs junior size RW (has one at home). HR increased from 102 to 128 bpm. sa02 remained >90%.  Stairs            Wheelchair Mobility    Modified Rankin (Stroke Patients Only)       Balance Overall balance assessment: Needs assistance Sitting-balance support: Feet supported;No upper extremity supported Sitting balance-Leahy Scale: Good Sitting balance - Comments: Able to reach outside BoS and donn sock on LLE however not able to reach RLE.   Standing balance support: During functional activity;Single extremity supported Standing balance-Leahy Scale: Fair                               Pertinent Vitals/Pain Pain Assessment: 0-10 Pain Score: 8  Pain Location: right knee Pain Descriptors / Indicators: Aching Pain Intervention(s): Monitored during session;Repositioned    Home Living Family/patient expects to be discharged to:: Private residence Living Arrangements: Alone Available Help at Discharge: Family;Personal care attendant Type of Home: House Home Access: Stairs to enter Entrance Stairs-Rails: None Entrance Stairs-Number of Steps: 5 Home Layout: One level Home Equipment: Shower seat - built in;Wheelchair - manual;Walker - 2 wheels;Bedside commode      Prior Function Level of  Independence: Independent with assistive device(s)         Comments: Patient using RW and w/c for mobility after initial surgery for external fixator.  Brother helped carry pt up the stairs.     Hand Dominance   Dominant Hand: Right    Extremity/Trunk Assessment   Upper Extremity Assessment: Defer to OT evaluation           Lower Extremity Assessment: RLE deficits/detail RLE Deficits / Details: Limited knee flexion secondary to pain. Ankle AROM WFL. Swelling present RLE and into foot/toes.       Communication   Communication: No difficulties  Cognition Arousal/Alertness: Awake/alert Behavior During Therapy: WFL for tasks assessed/performed Overall Cognitive Status: Within Functional Limits for tasks assessed                      General Comments      Exercises General Exercises - Lower Extremity Ankle Circles/Pumps: Right;10 reps;Seated Gluteal Sets: Right;10 reps;Seated Long Arc Quad: AAROM;Right;5 reps;Seated Other Exercises Other Exercises: knee flexion AROM AAROM x5 with 30 sec hold.      Assessment/Plan    PT Assessment Patient needs continued PT services  PT Diagnosis Acute pain;Difficulty walking   PT Problem List Decreased strength;Pain;Decreased range of motion;Impaired sensation;Decreased activity tolerance;Decreased balance;Decreased mobility  PT Treatment Interventions Gait training;Therapeutic activities;Therapeutic exercise;Wheelchair mobility training;Patient/family education;Stair training;Balance training   PT Goals (Current goals can be found in the Care Plan section) Acute Rehab PT Goals Patient Stated Goal: go home PT Goal Formulation: With patient Time For Goal Achievement: 08/02/14 Potential to Achieve Goals: Good    Frequency 7X/week   Barriers to discharge Decreased caregiver support Pt lives alone. has PCA.    Co-evaluation PT/OT/SLP Co-Evaluation/Treatment: Yes Reason for Co-Treatment: For patient/therapist safety PT goals addressed during session: Mobility/safety with mobility;Strengthening/ROM OT goals addressed during session: ADL's and self-care;Strengthening/ROM        End of Session Equipment Utilized During Treatment: Gait belt;Other (comment) (hinge brace) Activity Tolerance: Patient tolerated treatment well Patient left: in chair;with call bell/phone within reach Nurse Communication: Mobility status         Time: 1111-1141 PT Time Calculation (min) (ACUTE ONLY): 30 min   Charges:   PT Evaluation $Initial PT Evaluation Tier I: 1 Procedure     PT G CodesAlvie Heidelberg A 08-13-2014, 11:59 AM Alvie Heidelberg, PT, DPT [phone]

## 2014-07-19 NOTE — Progress Notes (Signed)
[facility] Progress Note  Subjective  Doing ok this am  Sore, c/o spasms  Reviewed nursing notes from overnight, i am surprised by this behavior. Continue to monitor   Review of Systems  Constitutional: Negative for fever and chills.  Respiratory: Negative for shortness of breath and wheezing.   Cardiovascular: Negative for chest pain and palpitations.  Gastrointestinal: Negative for nausea and vomiting.  Neurological: Negative for tingling and headaches.     Objective   BP 107/59 mmHg  Pulse 96  Temp(Src) 100.3 F (37.9 C) (Oral)  Resp 18  Ht '4\' 11"'  (1.499 m)  Wt 55.339 kg (122 lb)  BMI 24.63 kg/m2  SpO2 100%  Intake/Output      04/26 0701 - 04/27 0700 04/27 0701 - 04/28 0700   P.O. 50    I.V. (mL/kg) 2700 (48.8)    IV Piggyback 150    Total Intake(mL/kg) 2900 (52.4)    Urine (mL/kg/hr) 3450 (2.6) 300 (3.5)   Blood 200 (0.2)    Total Output 3650 300   Net -750 -300          Labs  Results for Autumn Daniels, Autumn Daniels (MRN [id number]) as of 07/19/2014 09:23  Ref. Range 07/19/2014 05:35  Sodium Latest Ref Range: 135-145 mmol/L 136  Potassium Latest Ref Range: 3.5-5.1 mmol/L 3.6  Chloride Latest Ref Range: 96-112 mmol/L 101  CO2 Latest Ref Range: 19-32 mmol/L 28  BUN Latest Ref Range: 6-23 mg/dL <5 (L)  Creatinine Latest Ref Range: 0.50-1.10 mg/dL 0.53  Calcium Latest Ref Range: 8.4-10.5 mg/dL 9.3  EGFR (Non-African Amer.) Latest Ref Range: >90 mL/min >90  EGFR (African American) Latest Ref Range: >90 mL/min >90  Glucose Latest Ref Range: 70-99 mg/dL 103 (H)  Anion gap Latest Ref Range: 5-15  7  WBC Latest Ref Range: 4.0-10.5 K/uL 8.9  RBC Latest Ref Range: 3.87-5.11 MIL/uL 2.74 (L)  Hemoglobin Latest Ref Range: 12.0-15.0 g/dL 8.4 (L)  HCT Latest Ref Range: 36.0-46.0 % 26.1 (L)  MCV Latest Ref Range: 78.0-100.0 fL 95.3  MCH Latest Ref Range: 26.0-34.0 pg 30.7  MCHC Latest Ref Range: 30.0-36.0 g/dL 32.2  RDW Latest Ref Range: 11.5-15.5 % 14.4  Platelets  Latest Ref Range: 150-400 K/uL 307    CBG (last 3)   Recent Labs  07/18/14 1910 07/18/14 2156 07/19/14 0534  GLUCAP 131* 162* 115*     Exam  Gen: awake, alert, resting comfortably in bed, NAD Lungs: clear anterior fields Cardiac: RRR  Abd:+ BS, NTND Ext:       Right Lower Extremity   Dressing c/d/i  Ext warm  + DP pulse  No pain with passive stretch  Swelling controlled  DPN, SPN, TN sensation intact  EHL, FHL, AT, PT, peroneals, gastroc motor intact    Assessment and Plan   POD/HD#: 1  54 y/o female s/p ORIF R bicondylar tibial plateau   1. R bicondylar tibial plateau fracture s/p ORIF  NWB x 8 weeks  Unrestricted R knee ROM   Hinged knee brace  PT/OT evals  Ice and elevate  Dressing change tomorrow     2. Pain management:  Continue with current regimen, including MR's   3. ABL anemia/Hemodynamics  Monitor  May need product  4. Medical issues   DM- continue with SSI   5. DVT/PE prophylaxis:  Lovenox x 21 days  6. ID:   Completed periop abx   7. Metabolic Bone Disease:  Vitamin d deficiency    Continue with supplements  Will need  dexa as outpt   8. Activity:  Per #1   9. FEN/Foley/Lines:   CHO mod diet  Continue with IVF    10. Dispo:  Therapy evals   Possible home tomorrow     Jari Pigg, PA-C [facility] [phone] (O) 07/19/2014 8:33 AM

## 2014-07-20 DIAGNOSIS — D62 Acute posthemorrhagic anemia: Secondary | ICD-10-CM

## 2014-07-20 LAB — CBC
HCT: 24.6 % — ABNORMAL LOW (ref 36.0–46.0)
HEMOGLOBIN: 7.8 g/dL — AB (ref 12.0–15.0)
MCH: 30.5 pg (ref 26.0–34.0)
MCHC: 31.7 g/dL (ref 30.0–36.0)
MCV: 96.1 fL (ref 78.0–100.0)
Platelets: 252 10*3/uL (ref 150–400)
RBC: 2.56 MIL/uL — ABNORMAL LOW (ref 3.87–5.11)
RDW: 14.4 % (ref 11.5–15.5)
WBC: 9 10*3/uL (ref 4.0–10.5)

## 2014-07-20 LAB — COMPREHENSIVE METABOLIC PANEL
ALBUMIN: 2.4 g/dL — AB (ref 3.5–5.2)
ALT: 11 U/L (ref 0–35)
AST: 16 U/L (ref 0–37)
Alkaline Phosphatase: 93 U/L (ref 39–117)
Anion gap: 7 (ref 5–15)
CALCIUM: 9.2 mg/dL (ref 8.4–10.5)
CHLORIDE: 105 mmol/L (ref 96–112)
CO2: 27 mmol/L (ref 19–32)
CREATININE: 0.56 mg/dL (ref 0.50–1.10)
GFR calc Af Amer: >90 mL/min (ref >90)
GFR calc non Af Amer: >90 mL/min (ref >90)
Glucose, Bld: 203 mg/dL — ABNORMAL HIGH (ref 70–99)
Potassium: 3.5 mmol/L (ref 3.5–5.1)
Sodium: 139 mmol/L (ref 135–145)
Total Bilirubin: 0.6 mg/dL (ref 0.3–1.2)
Total Protein: 5.5 g/dL — ABNORMAL LOW (ref 6.0–8.3)

## 2014-07-20 LAB — GLUCOSE, CAPILLARY
GLUCOSE-CAPILLARY: 109 mg/dL — AB (ref 70–99)
Glucose-Capillary: 241 mg/dL — ABNORMAL HIGH (ref 70–99)

## 2014-07-20 LAB — MAGNESIUM: Magnesium: 1.8 mg/dL (ref 1.5–2.5)

## 2014-07-20 LAB — TSH: TSH: 0.374 u[IU]/mL (ref 0.350–4.500)

## 2014-07-20 LAB — PHOSPHORUS: PHOSPHORUS: 2.8 mg/dL (ref 2.3–4.6)

## 2014-07-20 MED ORDER — METHOCARBAMOL 500 MG PO TABS: 500.0000 mg | ORAL_TABLET | Freq: Four times a day (QID) | ORAL | Status: DC | PRN

## 2014-07-20 MED ORDER — VITAMIN D (ERGOCALCIFEROL) 1.25 MG (50000 UNIT) PO CAPS
50000.0000 [IU] | ORAL_CAPSULE | ORAL | Status: DC
Start: 2014-07-20 — End: 2018-05-04

## 2014-07-20 MED ORDER — OXYCODONE-ACETAMINOPHEN 5-325 MG PO TABS: 1.0000 | ORAL_TABLET | Freq: Four times a day (QID) | ORAL | Status: DC | PRN

## 2014-07-20 MED ORDER — OXYCODONE HCL 5 MG PO TABS: 5.0000 mg | ORAL_TABLET | Freq: Four times a day (QID) | ORAL | Status: DC | PRN

## 2014-07-20 MED ORDER — GABAPENTIN 300 MG PO CAPS: 300.0000 mg | ORAL_CAPSULE | Freq: Three times a day (TID) | ORAL | Status: DC

## 2014-07-20 MED ORDER — ENOXAPARIN SODIUM 40 MG/0.4ML ~~LOC~~ SOLN: 40.0000 mg | SUBCUTANEOUS | Status: DC

## 2014-07-20 NOTE — Progress Notes (Signed)
When getting report from night nurse Cloyd Stagersoni Sneiderman, RN I was told that she had turned patient's PCA pump off because it was beeping. There was no order to D/C the pump. Nurse Roni also told me that she cleared the pump but she did not document what the patient had delivered during the last 4 hour period. Engineer, structuralCharge Nurse Beth notified.

## 2014-07-20 NOTE — Discharge Summary (Signed)
[facility] (OTS)  Patient ID: Autumn Daniels MRN: [id number] DOB/AGE: 07/02/1960 54 y.o.  Admit date: 07/18/2014 Discharge date: 07/20/2014  Admission Diagnoses: Right Bicondylar Tibial plateau fracture Vitamin D deficiency  Thyroid disease DM GERD Lupus   Discharge Diagnoses:  Principal Problem:   Tibial plateau fracture Active Problems:   Vitamin D deficiency   Thyroid disease   Diabetes mellitus without complication   GERD (gastroesophageal reflux disease)   Lupus   Acute blood loss anemia   Procedures Performed:  07/18/2014- Dr. Marcelino Scot  1. Open reduction and internal fixation of right bicondylar tibial     plateau fracture using medial and lateral approaches. 2. Repair of lateral meniscus capsular avulsion anterior horn. 3. Removal of external fixator under anesthesia. 4. Anterior compartment fasciotomy. 5. Debridement of ulcerative pin sites.   Discharged Condition: good  Hospital Course:   54 y/o female well known to OTS after sustaining R tibial plateau fracture approx. 2 weeks ago. She was sent to our office as an outpt from [facility]. Subsequently taken to OR for Ex fix revision. pts swelling had resolved enough to proceed with definitive fixation. Pt taken to OR on 07/18/2014 for procedures noted above. Pt tolerated surgery well. She was taken to the Ortho floor for observation, pain control and to begin therapies.  pts stay was uncomplicated. Pain was controlled with PCA and po meds. PCA was dc'd on POD 2. Pt was restarted on lovenox on POD 1 as well. She was on lovenox pre-op for dvt/pe prophylaxis. Pt worked with therapy during her stay and progressed well. Her vitamin d supplementation was continued due to vitamin D deficiency. Dressing was changed on POD 2. Pt was deemed to be stable for dc on POD 2. She was tolerating PO diet, voiding w/o difficulty. Pt dc'd in stable condition on 07/20/2014  Consults: None  Significant Diagnostic  Studies: labs:   Results for Autumn Daniels, Autumn Daniels (MRN [id number]) as of 07/20/2014 09:15  Ref. Range 07/20/2014 06:15  Sodium Latest Ref Range: 135-145 mmol/L 139  Potassium Latest Ref Range: 3.5-5.1 mmol/L 3.5  Chloride Latest Ref Range: 96-112 mmol/L 105  CO2 Latest Ref Range: 19-32 mmol/L 27  BUN Latest Ref Range: 6-23 mg/dL <5 (L)  Creatinine Latest Ref Range: 0.50-1.10 mg/dL 0.56  Calcium Latest Ref Range: 8.4-10.5 mg/dL 9.2  EGFR (Non-African Amer.) Latest Ref Range: >90 mL/min >90  EGFR (African American) Latest Ref Range: >90 mL/min >90  Glucose Latest Ref Range: 70-99 mg/dL 203 (H)  Anion gap Latest Ref Range: 5-15  7  Phosphorus Latest Ref Range: 2.3-4.6 mg/dL 2.8  Magnesium Latest Ref Range: 1.5-2.5 mg/dL 1.8  Alkaline Phosphatase Latest Ref Range: 39-117 U/L 93  Albumin Latest Ref Range: 3.5-5.2 g/dL 2.4 (L)  AST Latest Ref Range: 0-37 U/L 16  ALT Latest Ref Range: 0-35 U/L 11  Total Protein Latest Ref Range: 6.0-8.3 g/dL 5.5 (L)  Total Bilirubin Latest Ref Range: 0.3-1.2 mg/dL 0.6  WBC Latest Ref Range: 4.0-10.5 K/uL 9.0  RBC Latest Ref Range: 3.87-5.11 MIL/uL 2.56 (L)  Hemoglobin Latest Ref Range: 12.0-15.0 g/dL 7.8 (L)  HCT Latest Ref Range: 36.0-46.0 % 24.6 (L)  MCV Latest Ref Range: 78.0-100.0 fL 96.1  MCH Latest Ref Range: 26.0-34.0 pg 30.5  MCHC Latest Ref Range: 30.0-36.0 g/dL 31.7  RDW Latest Ref Range: 11.5-15.5 % 14.4  Platelets Latest Ref Range: 150-400 K/uL 252  TSH Latest Ref Range: 0.350-4.500 uIU/mL 0.374    Results for Autumn Daniels, Autumn Daniels (  MRN [id number]) as of 07/20/2014 09:15  Ref. Range 07/07/2014 06:30  Vit D, 25-Hydroxy Latest Ref Range: 30.0-100.0 ng/mL 16.6 (L)  Vitamin D 1, 25 (OH) Total Latest Units: pg/mL 61  Vitamin D2 1, 25 (OH) Latest Units: pg/mL 31  Vitamin D3 1, 25 (OH) Latest Units: pg/mL 30    Treatments: IV hydration, antibiotics: clindamycin, analgesia: dilaudid, Oxy IR, Percocet, anticoagulation: LMW heparin, insulin: Lantus and SSI, therapies:  PT, OT and RN and surgery: as above   Discharge Exam:  Orthopaedic Trauma Service (OTS)  Subjective: 2 Days Post-Op Procedure(s) (LRB): RIGHT OPEN REDUCTION INTERNAL FIXATION (ORIF) TIBIA FRACTURE (Right) REMOVAL EXTERNAL FIXATION LEG (Right) Patient reports pain as mild.   Eager to go home.  Objective: Current Vitals Blood pressure 105/51, pulse 101, temperature 100.5 F (38.1 C), temperature source Oral, resp. rate 18, height '4\' 11"'  (1.499 m), weight 122 lb (55.339 kg), SpO2 97 %. Vital signs in last 24 hours: Temp:  [98.1 F (36.7 C)-100.5 F (38.1 C)] 100.5 F (38.1 C) (04/28 0551) Pulse Rate:  [101-106] 101 (04/28 0551) Resp:  [18] 18 (04/28 0557) BP: (90-105)/(51-56) 105/51 mmHg (04/28 0551) SpO2:  [97 %-99 %] 97 % (04/28 0557)  Intake/Output from previous day: 04/27 0701 - 04/28 0700 In: 1776.7 [P.O.:560; I.V.:1216.7] Out: 300 [Urine:300]  LABS  Recent Labs (last 2 labs)      Recent Labs   07/18/14 0644  07/19/14 0535  07/20/14 0615   HGB  10.7*  8.4*  7.8*       Recent Labs (last 2 labs)      Recent Labs   07/19/14 0535  07/20/14 0615   WBC  8.9  9.0   RBC  2.74*  2.56*   HCT  26.1*  24.6*   PLT  307  252       Recent Labs (last 2 labs)      Recent Labs   07/18/14 0644  07/19/14 0535   NA  138  136   K  3.3*  3.6   CL  103  101   CO2  24  28   BUN  13  <5*   CREATININE  0.55  0.53   GLUCOSE  102*  103*   CALCIUM  9.9  9.3       Recent Labs (last 2 labs)      Recent Labs   07/18/14 0644   INR  0.96       Physical Exam  RLE     Wound looks excellent, pin sites ok             Knee 0-45 degrees             Sens DPN, SPN, TN intact             Motor EHL, ext, flex, evers intact             DP 2+, edema moderate  Assessment/Plan: 2 Days Post-Op Procedure(s) (LRB): RIGHT OPEN REDUCTION INTERNAL FIXATION (ORIF) TIBIA FRACTURE (Right) REMOVAL EXTERNAL FIXATION LEG (Right)  1. D/c to home post PT 2. NWB, AROM 3. May shower  in two days 4. F/u in 10-14 days   Altamese [place], MD [facility] [place] [phone] (p)   07/20/2014, 8:11 AM    Disposition: 01-Home or Self Care      Discharge Instructions    Call MD / Call 911    Complete by:  As directed   If you experience  chest pain or shortness of breath, CALL 911 and be transported to the hospital emergency room.  If you develope a fever above 101 F, pus (white drainage) or increased drainage or redness at the wound, or calf pain, call your surgeon's office.     Constipation Prevention    Complete by:  As directed   Drink plenty of fluids.  Prune juice may be helpful.  You may use a stool softener, such as Colace (over the counter) 100 mg twice a day.  Use MiraLax (over the counter) for constipation as needed.     Diet Carb Modified    Complete by:  As directed      Discharge instructions    Complete by:  As directed   [facility]edic [facility]uma Service Discharge Instructions   General Discharge Instructions  WEIGHT BEARING STATUS: Nonweightbearing Right Leg  RANGE OF MOTION/ACTIVITY: Range of motion as tolerated Right knee. Activity as tolerated while maintaining weightbearing restrictions   Wound care: dressing changes as needed. See detailed instructions below   Diet: as you were eating previously.  Can use over the counter stool softeners and bowel preparations, such as Miralax, to help with bowel movements.  Narcotics can be constipating.  Be sure to drink plenty of fluids  STOP SMOKING OR USING NICOTINE PRODUCTS!!!!  As discussed nicotine severely impairs your body's ability to heal surgical and traumatic wounds but also impairs bone healing.  Wounds and bone heal by forming microscopic blood vessels (angiogenesis) and nicotine is a vasoconstrictor (essentially, shrinks blood vessels).  Therefore, if vasoconstriction occurs to these microscopic blood vessels they essentially disappear and are unable to deliver necessary  nutrients to the healing tissue.  This is one modifiable factor that you can do to dramatically increase your chances of healing your injury.    (This means no smoking, no nicotine gum, patches, etc)  DO NOT USE NONSTEROIDAL ANTI-INFLAMMATORY DRUGS (NSAID'S)  Using products such as Advil (ibuprofen), Aleve (naproxen), Motrin (ibuprofen) for additional pain control during fracture healing can delay and/or prevent the healing response.  If you would like to take over the counter (OTC) medication, Tylenol (acetaminophen) is ok.  However, some narcotic medications that are given for pain control contain acetaminophen as well. Therefore, you should not exceed more than 4000 mg of tylenol in a day if you do not have liver disease.  Also note that there are may OTC medicines, such as cold medicines and allergy medicines that my contain tylenol as well.  If you have any questions about medications and/or interactions please ask your doctor/PA or your pharmacist.   PAIN MEDICATION USE AND EXPECTATIONS  You have likely been given narcotic medications to help control your pain.  After a traumatic event that results in an fracture (broken bone) with or without surgery, it is ok to use narcotic pain medications to help control one's pain.  We understand that everyone responds to pain differently and each individual patient will be evaluated on a regular basis for the continued need for narcotic medications. Ideally, narcotic medication use should last no more than 6-8 weeks (coinciding with fracture healing).   As a patient it is your responsibility as well to monitor narcotic medication use and report the amount and frequency you use these medications when you come to your office visit.   We would also advise that if you are using narcotic medications, you should take a dose prior to therapy to maximize you participation.  IF YOU ARE ON NARCOTIC MEDICATIONS IT  IS NOT PERMISSIBLE TO OPERATE A MOTOR VEHICLE  (MOTORCYCLE/CAR/TRUCK/MOPED) OR HEAVY MACHINERY DO NOT MIX NARCOTICS WITH OTHER CNS (CENTRAL NERVOUS SYSTEM) DEPRESSANTS SUCH AS ALCOHOL       ICE AND ELEVATE INJURED/OPERATIVE EXTREMITY  Using ice and elevating the injured extremity above your heart can help with swelling and pain control.  Icing in a pulsatile fashion, such as 20 minutes on and 20 minutes off, can be followed.    Do not place ice directly on skin. Make sure there is a barrier between to skin and the ice pack.    Using frozen items such as frozen peas works well as the conform nicely to the are that needs to be iced.  USE AN ACE WRAP OR TED HOSE FOR SWELLING CONTROL  In addition to icing and elevation, Ace wraps or TED hose are used to help limit and resolve swelling.  It is recommended to use Ace wraps or TED hose until you are informed to stop.    When using Ace Wraps start the wrapping distally (farthest away from the body) and wrap proximally (closer to the body)   Example: If you had surgery on your leg or thing and you do not have a splint on, start the ace wrap at the toes and work your way up to the thigh        If you had surgery on your upper extremity and do not have a splint on, start the ace wrap at your fingers and work your way up to the upper arm  IF YOU ARE IN A SPLINT OR CAST DO NOT [place]   If your splint gets wet for any reason please contact the office immediately. You may shower in your splint or cast as long as you keep it dry.  This can be done by wrapping in a cast cover or garbage back (or similar)  Do Not stick any thing down your splint or cast such as pencils, money, or hangers to try and scratch yourself with.  If you feel itchy take benadryl as prescribed on the bottle for itching  IF YOU ARE IN A CAM BOOT (BLACK BOOT)  You may remove boot periodically. Perform daily dressing changes as noted below.  Wash the liner of the boot regularly and wear a sock when wearing the boot. It  is recommended that you sleep in the boot until told otherwise  CALL THE OFFICE WITH ANY QUESTIONS OR CONCERTS: [phone]     Discharge Pin Site Instructions  Dress pins daily with Kerlix roll starting on POD 2. Wrap the Kerlix so that it tamps the skin down around the pin-skin interface to prevent/limit motion of the skin relative to the pin.  (Pin-skin motion is the primary cause of pain and infection related to external fixator pin sites).  Remove any crust or coagulum that may obstruct drainage with a saline moistened gauze or soap and water.  After POD 3, if there is no discernable drainage on the pin site dressing, the interval for change can by increased to every other day.  You may shower with the fixator, cleaning all pin sites gently with soap and water.  If you have a surgical wound this needs to be completely dry and without drainage before showering.  The extremity can be lifted by the fixator to facilitate wound care and transfers.  Notify the office/Doctor if you experience increasing drainage, redness, or pain from a pin site, or if you notice  purulent (thick, snot-like) drainage.  Discharge Wound Care Instructions  Do NOT apply any ointments, solutions or lotions to pin sites or surgical wounds.  These prevent needed drainage and even though solutions like hydrogen peroxide kill bacteria, they also damage cells lining the pin sites that help fight infection.  Applying lotions or ointments can keep the wounds moist and can cause them to breakdown and open up as well. This can increase the risk for infection. When in doubt call the office.  Surgical incisions should be dressed daily.  If any drainage is noted, use one layer of adaptic, then gauze, Kerlix, and an ace wrap.  Once the incision is completely dry and without drainage, it may be left open to air out.  Showering may begin 36-48 hours later.  Cleaning gently with soap and water.  Traumatic wounds should be  dressed daily as well.    One layer of adaptic, gauze, Kerlix, then ace wrap.  The adaptic can be discontinued once the draining has ceased    If you have a wet to dry dressing: wet the gauze with saline the squeeze as much saline out so the gauze is moist (not soaking wet), place moistened gauze over wound, then place a dry gauze over the moist one, followed by Kerlix wrap, then ace wrap.     Do not put a pillow under the knee. Place it under the heel.    Complete by:  As directed      Driving restrictions    Complete by:  As directed   No driving     Increase activity slowly as tolerated    Complete by:  As directed      Non weight bearing    Complete by:  As directed   Laterality:  right  Extremity:  Lower            Medication List    STOP taking these medications        doxycycline 100 MG capsule  Commonly known as:  VIBRAMYCIN     HYDROcodone-acetaminophen 5-325 MG per tablet  Commonly known as:  NORCO/VICODIN     traMADol 50 MG tablet  Commonly known as:  ULTRAM      TAKE these medications        Albiglutide 50 MG Pen  Inject 150 Units into the skin once a week. Takes on Monday ([place])     ARMOUR THYROID 60 MG tablet  Generic drug:  thyroid  Take 2.5 tablets by mouth daily.     cholecalciferol 1000 UNITS tablet  Commonly known as:  VITAMIN D  Take 1,000 Units by mouth daily.     enoxaparin 40 MG/0.4ML injection  Commonly known as:  LOVENOX  Inject 0.4 mLs (40 mg total) into the skin daily.     gabapentin 300 MG capsule  Commonly known as:  NEURONTIN  Take 1 capsule (300 mg total) by mouth 3 (three) times daily.     methocarbamol 500 MG tablet  Commonly known as:  ROBAXIN  Take 1-2 tablets (500-1,000 mg total) by mouth every 6 (six) hours as needed for muscle spasms.     oxyCODONE 5 MG immediate release tablet  Commonly known as:  Oxy IR/ROXICODONE  Take 1-2 tablets (5-10 mg total) by mouth every 6 (six) hours as needed for breakthrough pain (take  between peroccet doses for breakthrough pain).     oxyCODONE-acetaminophen 5-325 MG per tablet  Commonly known as:  PERCOCET/ROXICET  Take 1-2 tablets by  mouth every 6 (six) hours as needed for severe pain.     TOUJEO SOLOSTAR [place]  Inject 20 Units into the skin at bedtime.     Vitamin D (Ergocalciferol) 50000 UNITS Caps capsule  Commonly known as:  DRISDOL  Take 1 capsule (50,000 Units total) by mouth every 7 (seven) days.     Wheelchair Misc  1 Device by Does not apply route once.       Follow-up Information    Follow up with HANDY,MICHAEL H, MD. Schedule an appointment as soon as possible for a visit in 14 days.   Specialty:  Orthopedic Surgery   Why:  For suture removal, For wound re-check   Contact information:   [address] [address] [phone]       Discharge Instructions and Plan:  54 y/o female s/p ORIF R bicondylar tibial plateau    1. R bicondylar tibial plateau fracture s/p ORIF             NWB x 8 weeks             Unrestricted R knee ROM               Hinged knee brace             HHPT             Ice and elevate             Dressing changes as needed  Ok to shower                2. Pain management:             Percocet  Oxy IR  Robaxin   Gabapentin    3. ABL anemia/Hemodynamics             Monitor  Reviewed symptoms for which to seek medical attention. Stable     4. Medical issues               DM- home meds  Thyroid disease- home meds   5. DVT/PE prophylaxis:             Lovenox x 21 days  6. ID:               Completed periop abx   7. Metabolic Bone Disease:             Vitamin d deficiency                           Continue with supplements             Will need dexa as outpt   8. Activity:             Per #1   9. FEN/Foley/Lines:              CHO mod diet   10. Dispo:             dc home today  Follow up with ortho in 2 weeks. Pt will call for appointment        Signed:  Jari Pigg, PA-C [facility] [phone] (P) 07/20/2014, 9:22 AM

## 2014-07-20 NOTE — Discharge Instructions (Signed)
Orthopaedic Trauma Service Discharge Instructions   General Discharge Instructions  WEIGHT BEARING STATUS: Nonweightbearing Right Leg  RANGE OF MOTION/ACTIVITY: Range of motion as tolerated Right knee. Activity as tolerated while maintaining weightbearing restrictions   Wound care: dressing changes as needed. See detailed instructions below   Diet: as you were eating previously.  Can use over the counter stool softeners and bowel preparations, such as Miralax, to help with bowel movements.  Narcotics can be constipating.  Be sure to drink plenty of fluids  STOP SMOKING OR USING NICOTINE PRODUCTS!!!!  As discussed nicotine severely impairs your body's ability to heal surgical and traumatic wounds but also impairs bone healing.  Wounds and bone heal by forming microscopic blood vessels (angiogenesis) and nicotine is a vasoconstrictor (essentially, shrinks blood vessels).  Therefore, if vasoconstriction occurs to these microscopic blood vessels they essentially disappear and are unable to deliver necessary nutrients to the healing tissue.  This is one modifiable factor that you can do to dramatically increase your chances of healing your injury.    (This means no smoking, no nicotine gum, patches, etc)  DO NOT USE NONSTEROIDAL ANTI-INFLAMMATORY DRUGS (NSAID'S)  Using products such as Advil (ibuprofen), Aleve (naproxen), Motrin (ibuprofen) for additional pain control during fracture healing can delay and/or prevent the healing response.  If you would like to take over the counter (OTC) medication, Tylenol (acetaminophen) is ok.  However, some narcotic medications that are given for pain control contain acetaminophen as well. Therefore, you should not exceed more than 4000 mg of tylenol in a day if you do not have liver disease.  Also note that there are may OTC medicines, such as cold medicines and allergy medicines that my contain tylenol as well.  If you have any questions about medications and/or  interactions please ask your doctor/PA or your pharmacist.   PAIN MEDICATION USE AND EXPECTATIONS  You have likely been given narcotic medications to help control your pain.  After a traumatic event that results in an fracture (broken bone) with or without surgery, it is ok to use narcotic pain medications to help control one's pain.  We understand that everyone responds to pain differently and each individual patient will be evaluated on a regular basis for the continued need for narcotic medications. Ideally, narcotic medication use should last no more than 6-8 weeks (coinciding with fracture healing).   As a patient it is your responsibility as well to monitor narcotic medication use and report the amount and frequency you use these medications when you come to your office visit.   We would also advise that if you are using narcotic medications, you should take a dose prior to therapy to maximize you participation.  IF YOU ARE ON NARCOTIC MEDICATIONS IT IS NOT PERMISSIBLE TO OPERATE A MOTOR VEHICLE (MOTORCYCLE/CAR/TRUCK/MOPED) OR HEAVY MACHINERY DO NOT MIX NARCOTICS WITH OTHER CNS (CENTRAL NERVOUS SYSTEM) DEPRESSANTS SUCH AS ALCOHOL       ICE AND ELEVATE INJURED/OPERATIVE EXTREMITY  Using ice and elevating the injured extremity above your heart can help with swelling and pain control.  Icing in a pulsatile fashion, such as 20 minutes on and 20 minutes off, can be followed.    Do not place ice directly on skin. Make sure there is a barrier between to skin and the ice pack.    Using frozen items such as frozen peas works well as the conform nicely to the are that needs to be iced.  USE AN ACE WRAP OR TED HOSE FOR  SWELLING CONTROL  In addition to icing and elevation, Ace wraps or TED hose are used to help limit and resolve swelling.  It is recommended to use Ace wraps or TED hose until you are informed to stop.    When using Ace Wraps start the wrapping distally (farthest away from the body) and  wrap proximally (closer to the body)   Example: If you had surgery on your leg or thing and you do not have a splint on, start the ace wrap at the toes and work your way up to the thigh        If you had surgery on your upper extremity and do not have a splint on, start the ace wrap at your fingers and work your way up to the upper arm  IF YOU ARE IN A SPLINT OR CAST DO NOT REMOVE IT FOR ANY REASON   If your splint gets wet for any reason please contact the office immediately. You may shower in your splint or cast as long as you keep it dry.  This can be done by wrapping in a cast cover or garbage back (or similar)  Do Not stick any thing down your splint or cast such as pencils, money, or hangers to try and scratch yourself with.  If you feel itchy take benadryl as prescribed on the bottle for itching  IF YOU ARE IN A CAM BOOT (BLACK BOOT)  You may remove boot periodically. Perform daily dressing changes as noted below.  Wash the liner of the boot regularly and wear a sock when wearing the boot. It is recommended that you sleep in the boot until told otherwise  CALL THE OFFICE WITH ANY QUESTIONS OR CONCERTS: [phone]     Discharge Pin Site Instructions  Dress pins daily with Kerlix roll starting on POD 2. Wrap the Kerlix so that it tamps the skin down around the pin-skin interface to prevent/limit motion of the skin relative to the pin.  (Pin-skin motion is the primary cause of pain and infection related to external fixator pin sites).  Remove any crust or coagulum that may obstruct drainage with a saline moistened gauze or soap and water.  After POD 3, if there is no discernable drainage on the pin site dressing, the interval for change can by increased to every other day.  You may shower with the fixator, cleaning all pin sites gently with soap and water.  If you have a surgical wound this needs to be completely dry and without drainage before showering.  The extremity can be lifted  by the fixator to facilitate wound care and transfers.  Notify the office/Doctor if you experience increasing drainage, redness, or pain from a pin site, or if you notice purulent (thick, snot-like) drainage.  Discharge Wound Care Instructions  Do NOT apply any ointments, solutions or lotions to pin sites or surgical wounds.  These prevent needed drainage and even though solutions like hydrogen peroxide kill bacteria, they also damage cells lining the pin sites that help fight infection.  Applying lotions or ointments can keep the wounds moist and can cause them to breakdown and open up as well. This can increase the risk for infection. When in doubt call the office.  Surgical incisions should be dressed daily.  If any drainage is noted, use one layer of adaptic, then gauze, Kerlix, and an ace wrap.  Once the incision is completely dry and without drainage, it may be left open to air out.  Showering  may begin 36-48 hours later.  Cleaning gently with soap and water.  Traumatic wounds should be dressed daily as well.    One layer of adaptic, gauze, Kerlix, then ace wrap.  The adaptic can be discontinued once the draining has ceased    If you have a wet to dry dressing: wet the gauze with saline the squeeze as much saline out so the gauze is moist (not soaking wet), place moistened gauze over wound, then place a dry gauze over the moist one, followed by Kerlix wrap, then ace wrap.

## 2014-07-20 NOTE — Progress Notes (Signed)
Patient very pleasant this AM. Very pleased with her care and receptive to discharge teaching and instructions. No complaints.

## 2014-07-20 NOTE — Progress Notes (Signed)
Occupational Therapy Treatment Patient Details Name: Autumn CurlingMary Daniels MRN: [id number] DOB: 1960-12-23 Today's Date: 07/20/2014    History of present illness 54 year old female fell off of a step stool on 06/30/2014 and sustained a severe bicondylar right tibial plateau fracture. Patient was seen and evaluated at the [facility] where she had an external fixator. Patient discharged home using RW and w/c for mobility while NWB> RLE. 4/26 [facility] to [facility] for [facility] of external fixator and OPEN REDUCTION INTERNAL FIXATION (ORIF) BICONDYLAR TIBIAL FRACTURE (Right), MEDIAL AND LATERAL APPROACHES.    OT comments  Patient progressing towards OT goals, continue plan of care for now. Patient supervision>mod I with functional mobility and self-care tasks at this time. Patient reports that her brother will be home to assist prn and she has hired PCA for IADL tasks.    Follow Up Recommendations  No OT follow up;Supervision - Intermittent    Equipment Recommendations  3 in 1 bedside comode    Recommendations for Other Services  None at this time    Precautions / Restrictions Precautions Precautions: Fall Required Braces or Orthoses: Other Brace/Splint Other Brace/Splint: hinge brace. No knee AROM restrictions Restrictions Weight Bearing Restrictions: Yes RLE Weight Bearing: Non weight bearing       Mobility Bed Mobility Overal bed mobility: Modified Independent General bed mobility comments: No cues needed for bed mobility  Transfers Overall transfer level: Modified independent Equipment used: Rolling walker (2 wheeled) General transfer comment: No cues needed for transfers.    Balance Overall balance assessment: Needs assistance Sitting-balance support: No upper extremity supported;Feet unsupported Sitting balance-Leahy Scale: Good     Standing balance support: Bilateral upper extremity supported;During functional activity Standing balance-Leahy Scale: Fair    ADL  General ADL Comments: Patient overall supervision>mod I with functional mobility, transfers, and self-care tasks. Patient ambulated into BR for toilet transfer with supervision secondary to IV lines/site. Patient refused shower stall practice secondary to stating she knew how to do it and she was doing it at home prior to this surgery. Patient states she has assistance for IADLs lined up post acute d/c.      Cognition   Behavior During Therapy: WFL for tasks assessed/performed Overall Cognitive Status: Within Functional Limits for tasks assessed                Pertinent Vitals/ Pain       Pain Assessment: Faces Faces Pain Scale: Hurts a little bit Pain Location: RLE during movement/transfers Pain Descriptors / Indicators: Grimacing Pain Intervention(s): Monitored during session;Repositioned         Frequency Min 2X/week     Progress Toward Goals  OT Goals(current goals can now be found in the care plan section)  Progress towards OT goals: Progressing toward goals  Plan Discharge plan remains appropriate    End of Session Equipment Utilized During Treatment: Rolling walker   Activity Tolerance Patient tolerated treatment well   Patient Left in chair;with call bell/phone within reach    Time: 0810-0844 OT Time Calculation (min): 34 min  Charges: OT General Charges $OT Visit: 1 Procedure OT Treatments $Self Care/Home Management : 23-37 mins  Laronica Bhagat , MS, OTR/L, CLT Pager: [phone]  07/20/2014, 8:49 AM

## 2014-07-20 NOTE — Progress Notes (Signed)
Orthopaedic Trauma Service (OTS)  Subjective: 2 Days Post-Op Procedure(s) (LRB): RIGHT OPEN REDUCTION INTERNAL FIXATION (ORIF) TIBIA FRACTURE (Right) REMOVAL EXTERNAL FIXATION LEG (Right) Patient reports pain as mild.   Eager to go home.  Objective: Current Vitals Blood pressure 105/51, pulse 101, temperature 100.5 F (38.1 C), temperature source Oral, resp. rate 18, height 4\' 11"  (1.499 m), weight 122 lb (55.339 kg), SpO2 97 %. Vital signs in last 24 hours: Temp:  [98.1 F (36.7 C)-100.5 F (38.1 C)] 100.5 F (38.1 C) (04/28 0551) Pulse Rate:  [101-106] 101 (04/28 0551) Resp:  [18] 18 (04/28 0557) BP: (90-105)/(51-56) 105/51 mmHg (04/28 0551) SpO2:  [97 %-99 %] 97 % (04/28 0557)  Intake/Output from previous day: 04/27 0701 - 04/28 0700 In: 1776.7 [P.O.:560; I.V.:1216.7] Out: 300 [Urine:300]  LABS  Recent Labs  07/18/14 0644 07/19/14 0535 07/20/14 0615  HGB 10.7* 8.4* 7.8*    Recent Labs  07/19/14 0535 07/20/14 0615  WBC 8.9 9.0  RBC 2.74* 2.56*  HCT 26.1* 24.6*  PLT 307 252    Recent Labs  07/18/14 0644 07/19/14 0535  NA 138 136  K 3.3* 3.6  CL 103 101  CO2 24 28  BUN 13 <5*  CREATININE 0.55 0.53  GLUCOSE 102* 103*  CALCIUM 9.9 9.3    Recent Labs  07/18/14 0644  INR 0.96    Physical Exam  RLE Wound looks excellent, pin sites ok  Knee 0-45 degrees  Sens DPN, SPN, TN intact  Motor EHL, ext, flex, evers intact  DP 2+, edema moderate  Assessment/Plan: 2 Days Post-Op Procedure(s) (LRB): RIGHT OPEN REDUCTION INTERNAL FIXATION (ORIF) TIBIA FRACTURE (Right) REMOVAL EXTERNAL FIXATION LEG (Right)  1. D/c to home post PT 2. NWB, AROM 3. May shower in two days 4. F/u in 10-14 days   Myrene GalasMichael Valree Feild, MD [facility] [phone] (p)   07/20/2014, 8:11 AM

## 2014-07-21 LAB — PTH, INTACT AND CALCIUM
CALCIUM TOTAL (PTH): 8.9 mg/dL (ref 8.7–10.2)
PTH: 40 pg/mL (ref 15–65)

## 2014-07-21 LAB — PREALBUMIN: Prealbumin: 10 mg/dL — ABNORMAL LOW (ref 17–34)

## 2014-07-21 LAB — TRANSFERRIN: Transferrin: 155 mg/dL — ABNORMAL LOW (ref 200–370)

## 2014-09-11 ENCOUNTER — Other Ambulatory Visit (HOSPITAL_COMMUNITY): Payer: Self-pay | Admitting: Orthopedic Surgery

## 2014-11-01 ENCOUNTER — Encounter: Payer: Self-pay | Admitting: Orthopedic Surgery

## 2014-11-14 ENCOUNTER — Ambulatory Visit (INDEPENDENT_AMBULATORY_CARE_PROVIDER_SITE_OTHER): Payer: BLUE CROSS/BLUE SHIELD | Admitting: Orthopedic Surgery

## 2014-11-14 ENCOUNTER — Ambulatory Visit (INDEPENDENT_AMBULATORY_CARE_PROVIDER_SITE_OTHER): Payer: BLUE CROSS/BLUE SHIELD

## 2014-11-14 VITALS — BP 108/72 | Ht 59.0 in | Wt 119.0 lb

## 2014-11-14 DIAGNOSIS — M6789 Other specified disorders of synovium and tendon, multiple sites: Secondary | ICD-10-CM

## 2014-11-14 DIAGNOSIS — M25571 Pain in right ankle and joints of right foot: Secondary | ICD-10-CM

## 2014-11-14 DIAGNOSIS — M76829 Posterior tibial tendinitis, unspecified leg: Secondary | ICD-10-CM

## 2014-11-14 DIAGNOSIS — M25561 Pain in right knee: Secondary | ICD-10-CM | POA: Diagnosis not present

## 2014-11-15 MED ORDER — SPENCO ORTHOTIC ARCH SUPPORTS MISC: Status: DC

## 2014-11-15 MED ORDER — DIAZEPAM 10 MG PO TABS: 10.0000 mg | ORAL_TABLET | Freq: Every day | ORAL | Status: DC

## 2014-11-20 ENCOUNTER — Encounter: Payer: Self-pay | Admitting: Orthopedic Surgery

## 2014-11-20 NOTE — Progress Notes (Signed)
Patient ID: Autumn Daniels, female   DOB: 1960/10/22, 54 y.o.   MRN: [id number]  Chief Complaint  Patient presents with  . Ankle Pain    right ankle pain and swelling, no known injury    HPI: Consultations been requested by Dr. Salvadore Farber of [facility] in [place]  She presents with right ankle pain and swelling for several weeks now without relief. I initially saw her for tibial plateau fracture and treated her with a spanning external fixator referred her for definitive care by Dr. Myrene Galas of [facility] who eventually perform definitive fixation with medial and lateral plating. She has several complaints today. 1 she has pain and swelling over the posterior medial aspect of her tibia and plantar aspect of her right foot. She complains of stiffness of the knee and instability of the knee she also complains of muscle spasms at night    ROS related and new onset review of systems are negative except as noted in history.  THE PAST FAMILY, MEDICAL, SURGICAL AND SOCIAL HISTORY HAS BEEN REVIEWED AND RECORDED IN PROVIDED SECTIONS OF EPIC.  PHYSICAL EXAM  BP 108/72 mmHg  Ht  (1.499 m)  Wt 119 lb (53.978 kg)  BMI 24.02 kg/m2 Will refine on physical exam is that she has tenderness over the posterior medial tibial tendon from the plantar aspect of the foot up through the mid proximal tibia posterior medially. She has painful single leg heel rise with weakness on repeated attempts. Range of motion at the ankle is normal as is stability and has mild weakness of the posterior tibial tendon which is primarily related to pain swelling and tenosynovitis. Scans intact sensation is normal she has good pulse in the foot she is ambulatory with a limp. Her mood is flat she is oriented 3 her appearance is normal vital signs are stable and she does indeed have knee stiffness. There are no signs of infection around the incisions there is no swelling of the knee joint  There is no  instability felt in the knee joint in terms of ligament testing   IMAGING STUDIES films show that the tibial plateau fracture has been stabilized and the alignment is acceptable.   Dx  Posterior tibial tendon dysfunction Status post internal fixation tibial plateau fracture Muscle spasms Knee instability with stiffness, expected after surgery of this type   PLAN   I recommend that we give her 10 mg of Valium at night to control her nighttime muscle spasms She should aggressively perform knee exercises to control or try to improve stiffness. She does not appear to have clinical instability. This may be a function of weakness of the right leg Recommend Spenco Warm 'n Form; stem orthotics to support the posterior tibial tendon

## 2014-11-24 ENCOUNTER — Other Ambulatory Visit: Payer: Self-pay | Admitting: Orthopedic Surgery

## 2014-11-24 DIAGNOSIS — S86191A Other injury of other muscle(s) and tendon(s) of posterior muscle group at lower leg level, right leg, initial encounter: Secondary | ICD-10-CM

## 2014-11-24 DIAGNOSIS — S82141K Displaced bicondylar fracture of right tibia, subsequent encounter for closed fracture with nonunion: Secondary | ICD-10-CM

## 2014-12-14 ENCOUNTER — Ambulatory Visit
Admission: RE | Admit: 2014-12-14 | Discharge: 2014-12-14 | Disposition: A | Discharge status: Home / Self Care | Payer: BLUE CROSS/BLUE SHIELD | Source: Ambulatory Visit | Attending: Orthopedic Surgery | Admitting: Orthopedic Surgery

## 2014-12-14 DIAGNOSIS — S86191A Other injury of other muscle(s) and tendon(s) of posterior muscle group at lower leg level, right leg, initial encounter: Secondary | ICD-10-CM

## 2014-12-14 DIAGNOSIS — S82141K Displaced bicondylar fracture of right tibia, subsequent encounter for closed fracture with nonunion: Secondary | ICD-10-CM

## 2015-01-08 ENCOUNTER — Encounter (HOSPITAL_COMMUNITY): Payer: Self-pay | Admitting: *Deleted

## 2015-01-08 NOTE — Progress Notes (Signed)
Anesthesia Chart Review:  Pt is [age] year old female scheduled for R proximal tibial hardware removal, R knee arthroscopy on 01/09/2015 with Dr. Carola FrostHandy.   Pt is a same day work up.   PMH includes: DM, lupus, anemia, hyperthyroidism (tx with radioactive iodine 1990), Levonne SpillerStevens Johnson syndrome, GERD. Former smoker. BMI 24. S/p ORIF R tibia fracture 07/18/14, external fixation RLE 06/30/14, revision external fixation RLE 07/07/14.   Medications include: albiglutide, armour thyroid, iron, toujeo, requip.  Labs will be obtained DOS.   Chest x-ray 06/29/14 reviewed. No active cardiopulmonary disease.   EKG 06/29/2014: Sinus rhythm. RSR' in V1 or V2, right VCD or RVH.  Pt reported to PAT RN that her last hgbA1c a couple of weeks ago was 13, and at that time her fasting blood glucose was in 400 range. Since then her medication has been adjusted and her fasting blood sugar is now in low 200 range. PAT RN notified pt that is glucose is >250 DOS, her surgery will likely get cancelled.   If labs acceptable DOS, I anticipate pt can proceed as scheduled.   Rica Mastngela Shelise Maron, FNP-BC [facility] Phone: [phone] 01/08/2015 1:46 PM

## 2015-01-08 NOTE — Progress Notes (Signed)
Called Dr. Handy's office for pre-op orders, spoke with Gwen. 

## 2015-01-08 NOTE — Progress Notes (Signed)
Pt states her blood sugar has been out of control for approximately 2 months since receiving a cortisone injection. Her most recent Hgb A1C about 2 weeks ago was 13. At that time her fasting blood sugars were running in the 400 range. Her Toujeo insulin has been increased and she said the other morning her fasting blood sugar was in the low 200 range. I warned her that if her blood sugar was greater than 250 tomorrow, her surgery could be cancelled. She states she understands because she was supposed to have a colonoscopy and it was cancelled because of her high blood sugar. She states she feels sure it won't be that high.

## 2015-01-09 ENCOUNTER — Ambulatory Visit (HOSPITAL_COMMUNITY): Payer: BLUE CROSS/BLUE SHIELD | Admitting: Emergency Medicine

## 2015-01-09 ENCOUNTER — Encounter (HOSPITAL_COMMUNITY)
Admission: RE | Disposition: A | Discharge status: Home / Self Care | Payer: Self-pay | Source: Ambulatory Visit | Attending: Orthopedic Surgery

## 2015-01-09 ENCOUNTER — Ambulatory Visit (HOSPITAL_COMMUNITY)
Admission: RE | Admit: 2015-01-09 | Discharge: 2015-01-09 | Disposition: A | Discharge status: Home / Self Care | Payer: BLUE CROSS/BLUE SHIELD | Source: Ambulatory Visit | Attending: Orthopedic Surgery | Admitting: Orthopedic Surgery

## 2015-01-09 ENCOUNTER — Encounter (HOSPITAL_COMMUNITY): Payer: Self-pay | Admitting: Certified Registered Nurse Anesthetist

## 2015-01-09 DIAGNOSIS — M797 Fibromyalgia: Secondary | ICD-10-CM | POA: Diagnosis not present

## 2015-01-09 DIAGNOSIS — T8484XA Pain due to internal orthopedic prosthetic devices, implants and grafts, initial encounter: Secondary | ICD-10-CM | POA: Insufficient documentation

## 2015-01-09 DIAGNOSIS — M6751 Plica syndrome, right knee: Secondary | ICD-10-CM | POA: Diagnosis not present

## 2015-01-09 DIAGNOSIS — E559 Vitamin D deficiency, unspecified: Secondary | ICD-10-CM | POA: Diagnosis not present

## 2015-01-09 DIAGNOSIS — M65342 Trigger finger, left ring finger: Secondary | ICD-10-CM | POA: Insufficient documentation

## 2015-01-09 DIAGNOSIS — E1165 Type 2 diabetes mellitus with hyperglycemia: Secondary | ICD-10-CM | POA: Diagnosis not present

## 2015-01-09 DIAGNOSIS — Y831 Surgical operation with implant of artificial internal device as the cause of abnormal reaction of the patient, or of later complication, without mention of misadventure at the time of the procedure: Secondary | ICD-10-CM | POA: Insufficient documentation

## 2015-01-09 DIAGNOSIS — Z794 Long term (current) use of insulin: Secondary | ICD-10-CM | POA: Insufficient documentation

## 2015-01-09 DIAGNOSIS — M329 Systemic lupus erythematosus, unspecified: Secondary | ICD-10-CM | POA: Insufficient documentation

## 2015-01-09 DIAGNOSIS — M65341 Trigger finger, right ring finger: Secondary | ICD-10-CM | POA: Diagnosis not present

## 2015-01-09 DIAGNOSIS — M179 Osteoarthritis of knee, unspecified: Secondary | ICD-10-CM | POA: Insufficient documentation

## 2015-01-09 DIAGNOSIS — K219 Gastro-esophageal reflux disease without esophagitis: Secondary | ICD-10-CM | POA: Diagnosis not present

## 2015-01-09 DIAGNOSIS — Z79899 Other long term (current) drug therapy: Secondary | ICD-10-CM | POA: Insufficient documentation

## 2015-01-09 DIAGNOSIS — L511 Stevens-Johnson syndrome: Secondary | ICD-10-CM | POA: Insufficient documentation

## 2015-01-09 DIAGNOSIS — E079 Disorder of thyroid, unspecified: Secondary | ICD-10-CM | POA: Diagnosis not present

## 2015-01-09 DIAGNOSIS — Z87891 Personal history of nicotine dependence: Secondary | ICD-10-CM | POA: Diagnosis not present

## 2015-01-09 HISTORY — PX: HARDWARE REMOVAL: SHX979

## 2015-01-09 HISTORY — DX: Diarrhea, unspecified: R19.7

## 2015-01-09 HISTORY — DX: Headache: R51

## 2015-01-09 HISTORY — PX: STERIOD INJECTION: SHX5046

## 2015-01-09 HISTORY — DX: Depression, unspecified: F32.A

## 2015-01-09 HISTORY — DX: Major depressive disorder, single episode, unspecified: F32.9

## 2015-01-09 HISTORY — PX: KNEE ARTHROSCOPY: SHX127

## 2015-01-09 HISTORY — DX: Anxiety disorder, unspecified: F41.9

## 2015-01-09 HISTORY — DX: Headache, unspecified: R51.9

## 2015-01-09 HISTORY — DX: Anemia, unspecified: D64.9

## 2015-01-09 LAB — BASIC METABOLIC PANEL
ANION GAP: 10 (ref 5–15)
BUN: 9 mg/dL (ref 6–20)
CALCIUM: 10.3 mg/dL (ref 8.9–10.3)
CO2: 23 mmol/L (ref 22–32)
CREATININE: 0.42 mg/dL — AB (ref 0.44–1.00)
Chloride: 107 mmol/L (ref 101–111)
Glucose, Bld: 143 mg/dL — ABNORMAL HIGH (ref 65–99)
Potassium: 3.7 mmol/L (ref 3.5–5.1)
SODIUM: 140 mmol/L (ref 135–145)

## 2015-01-09 LAB — CBC
HCT: 42.4 % (ref 36.0–46.0)
HEMOGLOBIN: 14.1 g/dL (ref 12.0–15.0)
MCH: 30.5 pg (ref 26.0–34.0)
MCHC: 33.3 g/dL (ref 30.0–36.0)
MCV: 91.8 fL (ref 78.0–100.0)
PLATELETS: 204 10*3/uL (ref 150–400)
RBC: 4.62 MIL/uL (ref 3.87–5.11)
RDW: 13.5 % (ref 11.5–15.5)
WBC: 7.8 10*3/uL (ref 4.0–10.5)

## 2015-01-09 LAB — GLUCOSE, CAPILLARY
GLUCOSE-CAPILLARY: 139 mg/dL — AB (ref 65–99)
GLUCOSE-CAPILLARY: 123 mg/dL — AB (ref 65–99)
Glucose-Capillary: 103 mg/dL — ABNORMAL HIGH (ref 65–99)

## 2015-01-09 SURGERY — REMOVAL, HARDWARE
Anesthesia: General | Site: Leg Lower | Laterality: Right

## 2015-01-09 MED ORDER — HYDROMORPHONE HCL 1 MG/ML IJ SOLN
0.2500 mg | INTRAMUSCULAR | Status: DC | PRN
  Administered 2015-01-09 (×4): 0.5 mg via INTRAVENOUS

## 2015-01-09 MED ORDER — PROPOFOL 10 MG/ML IV BOLUS
INTRAVENOUS | Status: DC | PRN
  Administered 2015-01-09: 150 mg via INTRAVENOUS
  Administered 2015-01-09 (×2): 10 mg via INTRAVENOUS

## 2015-01-09 MED ORDER — MORPHINE SULFATE (PF) 4 MG/ML IV SOLN
INTRAVENOUS | Status: AC
  Filled 2015-01-09: qty 1

## 2015-01-09 MED ORDER — FENTANYL CITRATE (PF) 100 MCG/2ML IJ SOLN
INTRAMUSCULAR | Status: DC | PRN
  Administered 2015-01-09 (×2): 50 ug via INTRAVENOUS
  Administered 2015-01-09: 25 ug via INTRAVENOUS
  Administered 2015-01-09: 50 ug via INTRAVENOUS
  Administered 2015-01-09: 100 ug via INTRAVENOUS
  Administered 2015-01-09: 25 ug via INTRAVENOUS
  Administered 2015-01-09: 50 ug via INTRAVENOUS

## 2015-01-09 MED ORDER — OXYCODONE HCL 5 MG PO TABS
5.0000 mg | ORAL_TABLET | Freq: Once | ORAL | Status: AC | PRN
  Administered 2015-01-09: 5 mg via ORAL

## 2015-01-09 MED ORDER — MIDAZOLAM HCL 2 MG/2ML IJ SOLN
INTRAMUSCULAR | Status: AC
  Filled 2015-01-09: qty 4

## 2015-01-09 MED ORDER — MIDAZOLAM HCL 5 MG/5ML IJ SOLN
INTRAMUSCULAR | Status: DC | PRN
  Administered 2015-01-09: 2 mg via INTRAVENOUS

## 2015-01-09 MED ORDER — PHENYLEPHRINE HCL 10 MG/ML IJ SOLN
INTRAMUSCULAR | Status: DC | PRN
  Administered 2015-01-09: 120 ug via INTRAVENOUS
  Administered 2015-01-09 (×3): 40 ug via INTRAVENOUS

## 2015-01-09 MED ORDER — LIDOCAINE HCL (CARDIAC) 20 MG/ML IV SOLN
INTRAVENOUS | Status: AC
  Filled 2015-01-09: qty 5

## 2015-01-09 MED ORDER — DEXAMETHASONE SODIUM PHOSPHATE 10 MG/ML IJ SOLN
INTRAMUSCULAR | Status: AC
  Filled 2015-01-09: qty 1

## 2015-01-09 MED ORDER — SODIUM CHLORIDE 0.9 % IV SOLN
10.0000 mg | INTRAVENOUS | Status: DC | PRN
  Administered 2015-01-09: 10 ug/min via INTRAVENOUS

## 2015-01-09 MED ORDER — LACTATED RINGERS IV SOLN
INTRAVENOUS | Status: DC
  Administered 2015-01-09: 09:00:00 via INTRAVENOUS

## 2015-01-09 MED ORDER — PROPOFOL 10 MG/ML IV BOLUS
INTRAVENOUS | Status: AC
  Filled 2015-01-09: qty 20

## 2015-01-09 MED ORDER — HYDROMORPHONE HCL 1 MG/ML IJ SOLN
INTRAMUSCULAR | Status: AC
  Administered 2015-01-09: 0.5 mg via INTRAVENOUS
  Filled 2015-01-09: qty 1

## 2015-01-09 MED ORDER — LACTATED RINGERS IV SOLN
INTRAVENOUS | Status: DC | PRN
  Administered 2015-01-09 (×2): via INTRAVENOUS

## 2015-01-09 MED ORDER — FENTANYL CITRATE (PF) 250 MCG/5ML IJ SOLN
INTRAMUSCULAR | Status: AC
  Filled 2015-01-09: qty 5

## 2015-01-09 MED ORDER — CHLORHEXIDINE GLUCONATE 4 % EX LIQD: 60.0000 mL | Freq: Once | CUTANEOUS | Status: DC

## 2015-01-09 MED ORDER — CLINDAMYCIN PHOSPHATE 900 MG/50ML IV SOLN
900.0000 mg | INTRAVENOUS | Status: AC
  Administered 2015-01-09: 900 mg via INTRAVENOUS
  Filled 2015-01-09: qty 50

## 2015-01-09 MED ORDER — BUPIVACAINE-EPINEPHRINE 0.25% -1:200000 IJ SOLN
INTRAMUSCULAR | Status: DC | PRN
  Administered 2015-01-09: 5 mL

## 2015-01-09 MED ORDER — SUGAMMADEX SODIUM 200 MG/2ML IV SOLN
INTRAVENOUS | Status: AC
  Filled 2015-01-09: qty 2

## 2015-01-09 MED ORDER — OXYCODONE HCL 5 MG PO TABS
ORAL_TABLET | ORAL | Status: AC
  Filled 2015-01-09: qty 1

## 2015-01-09 MED ORDER — METHYLPREDNISOLONE ACETATE 40 MG/ML IJ SUSP
INTRAMUSCULAR | Status: AC
  Filled 2015-01-09: qty 1

## 2015-01-09 MED ORDER — ROCURONIUM BROMIDE 50 MG/5ML IV SOLN
INTRAVENOUS | Status: AC
  Filled 2015-01-09: qty 1

## 2015-01-09 MED ORDER — OXYCODONE HCL 5 MG/5ML PO SOLN: 5.0000 mg | Freq: Once | ORAL | Status: AC | PRN

## 2015-01-09 MED ORDER — ROCURONIUM BROMIDE 100 MG/10ML IV SOLN
INTRAVENOUS | Status: DC | PRN
  Administered 2015-01-09: 40 mg via INTRAVENOUS

## 2015-01-09 MED ORDER — SUGAMMADEX SODIUM 200 MG/2ML IV SOLN
INTRAVENOUS | Status: DC | PRN
  Administered 2015-01-09: 100 mg via INTRAVENOUS

## 2015-01-09 MED ORDER — PROMETHAZINE HCL 25 MG/ML IJ SOLN: 6.2500 mg | INTRAMUSCULAR | Status: DC | PRN

## 2015-01-09 MED ORDER — ONDANSETRON HCL 4 MG/2ML IJ SOLN
INTRAMUSCULAR | Status: AC
  Filled 2015-01-09: qty 2

## 2015-01-09 MED ORDER — ONDANSETRON HCL 4 MG/2ML IJ SOLN
INTRAMUSCULAR | Status: DC | PRN
  Administered 2015-01-09: 4 mg via INTRAVENOUS

## 2015-01-09 MED ORDER — SODIUM CHLORIDE 0.9 % IR SOLN
Status: DC | PRN
  Administered 2015-01-09 (×2): 3000 mL
  Administered 2015-01-09: 1000 mL

## 2015-01-09 MED ORDER — MORPHINE SULFATE (PF) 4 MG/ML IV SOLN
INTRAVENOUS | Status: DC | PRN
  Administered 2015-01-09: 4 mg via SUBCUTANEOUS

## 2015-01-09 MED ORDER — LIDOCAINE HCL (CARDIAC) 20 MG/ML IV SOLN
INTRAVENOUS | Status: DC | PRN
  Administered 2015-01-09: 60 mg via INTRAVENOUS

## 2015-01-09 MED ORDER — METHYLPREDNISOLONE ACETATE 40 MG/ML IJ SUSP
INTRAMUSCULAR | Status: DC | PRN
  Administered 2015-01-09: 20 mg via INTRA_ARTICULAR

## 2015-01-09 MED ORDER — PHENYLEPHRINE 40 MCG/ML (10ML) SYRINGE FOR IV PUSH (FOR BLOOD PRESSURE SUPPORT)
PREFILLED_SYRINGE | INTRAVENOUS | Status: AC
  Filled 2015-01-09: qty 10

## 2015-01-09 MED ORDER — BUPIVACAINE-EPINEPHRINE (PF) 0.25% -1:200000 IJ SOLN
INTRAMUSCULAR | Status: AC
  Filled 2015-01-09: qty 30

## 2015-01-09 MED ORDER — OXYCODONE-ACETAMINOPHEN 5-325 MG PO TABS: 1.0000 | ORAL_TABLET | Freq: Four times a day (QID) | ORAL | Status: DC | PRN

## 2015-01-09 MED ORDER — METHOCARBAMOL 500 MG PO TABS: 500.0000 mg | ORAL_TABLET | Freq: Four times a day (QID) | ORAL | Status: DC

## 2015-01-09 SURGICAL SUPPLY — 71 items
BANDAGE ELASTIC 4 VELCRO ST LF (GAUZE/BANDAGES/DRESSINGS) ×3 IMPLANT
BANDAGE ELASTIC 6 VELCRO ST LF (GAUZE/BANDAGES/DRESSINGS) ×3 IMPLANT
BANDAGE ESMARK 6X9 LF (GAUZE/BANDAGES/DRESSINGS) ×2 IMPLANT
BLADE CUDA 5.5 (BLADE) IMPLANT
BLADE GREAT WHITE 4.2 (BLADE) ×3 IMPLANT
BLADE SURG 11 STRL SS (BLADE) ×3 IMPLANT
BNDG COHESIVE 6X5 TAN STRL LF (GAUZE/BANDAGES/DRESSINGS) ×3 IMPLANT
BNDG ESMARK 6X9 LF (GAUZE/BANDAGES/DRESSINGS) ×3
BNDG GAUZE ELAST 4 BULKY (GAUZE/BANDAGES/DRESSINGS) ×3 IMPLANT
BRUSH SCRUB DISP (MISCELLANEOUS) ×6 IMPLANT
CLEANER TIP ELECTROSURG 2X2 (MISCELLANEOUS) ×3 IMPLANT
COVER SURGICAL LIGHT HANDLE (MISCELLANEOUS) ×6 IMPLANT
CUFF TOURNIQUET SINGLE 18IN (TOURNIQUET CUFF) IMPLANT
CUFF TOURNIQUET SINGLE 24IN (TOURNIQUET CUFF) IMPLANT
CUFF TOURNIQUET SINGLE 34IN LL (TOURNIQUET CUFF) ×3 IMPLANT
CUFF TOURNIQUET SINGLE 44IN (TOURNIQUET CUFF) IMPLANT
DRAPE ARTHROSCOPY W/POUCH 114 (DRAPES) ×3 IMPLANT
DRAPE C-ARM 42X72 X-RAY (DRAPES) IMPLANT
DRAPE C-ARMOR (DRAPES) ×3 IMPLANT
DRAPE OEC MINIVIEW 54X84 (DRAPES) ×3 IMPLANT
DRAPE U-SHAPE 47X51 STRL (DRAPES) ×3 IMPLANT
DRSG ADAPTIC 3X8 NADH LF (GAUZE/BANDAGES/DRESSINGS) ×3 IMPLANT
DRSG EMULSION OIL 3X3 NADH (GAUZE/BANDAGES/DRESSINGS) ×3 IMPLANT
DRSG PAD ABDOMINAL 8X10 ST (GAUZE/BANDAGES/DRESSINGS) ×3 IMPLANT
ELECT REM PT RETURN 9FT ADLT (ELECTROSURGICAL) ×3
ELECTRODE REM PT RTRN 9FT ADLT (ELECTROSURGICAL) ×2 IMPLANT
EVACUATOR 1/8 PVC DRAIN (DRAIN) IMPLANT
GAUZE SPONGE 4X4 12PLY STRL (GAUZE/BANDAGES/DRESSINGS) ×3 IMPLANT
GAUZE SPONGE 4X4 16PLY XRAY LF (GAUZE/BANDAGES/DRESSINGS) ×3 IMPLANT
GLOVE BIO SURGEON STRL SZ7.5 (GLOVE) ×3 IMPLANT
GLOVE BIO SURGEON STRL SZ8 (GLOVE) ×6 IMPLANT
GLOVE BIOGEL PI IND STRL 7.5 (GLOVE) ×2 IMPLANT
GLOVE BIOGEL PI IND STRL 8 (GLOVE) ×4 IMPLANT
GLOVE BIOGEL PI INDICATOR 7.5 (GLOVE) ×1
GLOVE BIOGEL PI INDICATOR 8 (GLOVE) ×2
GOWN STRL REUS W/ TWL LRG LVL3 (GOWN DISPOSABLE) ×6 IMPLANT
GOWN STRL REUS W/ TWL XL LVL3 (GOWN DISPOSABLE) ×2 IMPLANT
GOWN STRL REUS W/TWL LRG LVL3 (GOWN DISPOSABLE) ×3
GOWN STRL REUS W/TWL XL LVL3 (GOWN DISPOSABLE) ×1
KIT BASIN OR (CUSTOM PROCEDURE TRAY) ×3 IMPLANT
KIT ROOM TURNOVER OR (KITS) ×3 IMPLANT
MANIFOLD NEPTUNE II (INSTRUMENTS) IMPLANT
NEEDLE 22X1 1/2 (OR ONLY) (NEEDLE) IMPLANT
NS IRRIG 1000ML POUR BTL (IV SOLUTION) ×3 IMPLANT
PACK ARTHROSCOPY DSU (CUSTOM PROCEDURE TRAY) ×3 IMPLANT
PACK ORTHO EXTREMITY (CUSTOM PROCEDURE TRAY) ×3 IMPLANT
PAD ARMBOARD 7.5X6 YLW CONV (MISCELLANEOUS) ×6 IMPLANT
PADDING CAST COTTON 6X4 STRL (CAST SUPPLIES) ×3 IMPLANT
SET ARTHROSCOPY TUBING (MISCELLANEOUS) ×1
SET ARTHROSCOPY TUBING LN (MISCELLANEOUS) ×2 IMPLANT
SPONGE LAP 18X18 X RAY DECT (DISPOSABLE) ×3 IMPLANT
SPONGE SCRUB IODOPHOR (GAUZE/BANDAGES/DRESSINGS) ×3 IMPLANT
STAPLER VISISTAT 35W (STAPLE) ×3 IMPLANT
STOCKINETTE IMPERVIOUS LG (DRAPES) ×3 IMPLANT
STRIP CLOSURE SKIN 1/2X4 (GAUZE/BANDAGES/DRESSINGS) IMPLANT
SUCTION FRAZIER TIP 10 FR DISP (SUCTIONS) IMPLANT
SUT ETHILON 3 0 PS 1 (SUTURE) IMPLANT
SUT ETHILON 4 0 PS 2 18 (SUTURE) ×6 IMPLANT
SUT PDS AB 2-0 CT1 27 (SUTURE) IMPLANT
SUT VIC AB 0 CT1 27 (SUTURE)
SUT VIC AB 0 CT1 27XBRD ANBCTR (SUTURE) IMPLANT
SUT VIC AB 2-0 CT1 27 (SUTURE) ×2
SUT VIC AB 2-0 CT1 TAPERPNT 27 (SUTURE) ×4 IMPLANT
SYR CONTROL 10ML LL (SYRINGE) IMPLANT
TOWEL OR 17X24 6PK STRL BLUE (TOWEL DISPOSABLE) ×6 IMPLANT
TOWEL OR 17X26 10 PK STRL BLUE (TOWEL DISPOSABLE) ×6 IMPLANT
TUBE CONNECTING 12X1/4 (SUCTIONS) ×3 IMPLANT
UNDERPAD 30X30 INCONTINENT (UNDERPADS AND DIAPERS) ×3 IMPLANT
WAND HAND CNTRL MULTIVAC 90 (MISCELLANEOUS) IMPLANT
WATER STERILE IRR 1000ML POUR (IV SOLUTION) ×3 IMPLANT
YANKAUER SUCT BULB TIP NO VENT (SUCTIONS) ×3 IMPLANT

## 2015-01-09 NOTE — H&P (Signed)
Orthopaedic Trauma Service H&P   Chief Complaint:  R knee pain  HPI:   54 y/o female with complex medical history including DM and Lupus s/p ORIF severely comminuted R tibial plateau fracture 06/2014 presents with persistent R knee pain.  Pt presents today for R knee arthroscopy and ROH.   Proceed with surgery as long as sugars are at acceptable levels. Pt did have some delayed wound healing from initial surgery due to autoimmune disease   Past Medical History  Diagnosis Date  . Thyroid disease     has had radioactive iodine treatment for hyperthyroidism in 1990  . Fibromyalgia   . Diabetes mellitus without complication (HCC)     type 2  . Stevens-Johnson syndrome (HCC)   . Pneumonia   . Frequent UTI     due to small urethra  . GERD (gastroesophageal reflux disease)   . History of hiatal hernia   . Lupus (HCC)   . Vitamin D deficiency 07/07/2014  . Anxiety   . Depression   . Headache     hx of migraines  . Anemia   . Diarrhea     as of 12/2014 - pt states that she's had diarrhea for a year    Past Surgical History  Procedure Laterality Date  . Tonsillectomy    . Cholecystectomy      2 bifurcations of liver during lap chole  . Liver surgery      x4 due to bifurcations from lap chole  . External fixation leg Right 06/30/2014    Procedure: EXTERNAL FIXATION LEG;  Surgeon: Vickki Hearing, MD;  Location: [facility];  Service: Orthopedics;  Laterality: Right;  synthes large fragment exteranal fixation  c arm   . External fixation leg Right 07/07/2014    Procedure: REVISION EXTERNAL FIXATION LEG;  Surgeon: Myrene Galas, MD;  Location: [facility] OR;  Service: Orthopedics;  Laterality: Right;  . Orif tibia plateau Right 07/18/2014    Procedure: RIGHT OPEN REDUCTION INTERNAL FIXATION (ORIF) TIBIA FRACTURE;  Surgeon: Myrene Galas, MD;  Location: [facility] OR;  Service: Orthopedics;  Laterality: Right;  ORIF of bicondylar plateau fracture  . External fixation removal Right 07/18/2014    Procedure:  REMOVAL EXTERNAL FIXATION LEG;  Surgeon: Myrene Galas, MD;  [facility] OR;  Service: Orthopedics;  Laterality: Right;    Family History  Problem Relation Age of Onset  . Colon cancer Mother   . Lung disease Father    Social History:  reports that she quit smoking about 7 months ago. Her smoking use included Cigarettes. She has a 15 pack-year smoking history. She has never used smokeless tobacco. She reports that she does not drink alcohol or use illicit drugs.  Allergies:  Allergies  Allergen Reactions  . 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   Current Outpatient Prescriptions on File Prior to Encounter  Medication Sig Dispense Refill  . Albiglutide 50 MG PEN Inject 50 Units into the skin once a week. Takes on Monday ([place])    . ARMOUR THYROID 60 MG tablet Take 150 mg by mouth daily.   2  . Foot Care Products [facility] ORTHOT[employer]) MISC Warm and form 2 each 0  . gabapentin (NEURONTIN) 300 MG capsule Take 1 capsule (300 mg total) by  mouth 3 (three) times daily. 90 capsule 0  . Insulin Glargine (TOUJEO SOLOSTAR [facility] [place]) Inject 30 Units into the skin at bedtime.     . Vitamin D, Ergocalciferol, (DRISDOL) 50000 UNITS CAPS capsule Take 1 capsule (50,000 Units total) by mouth every 7 (seven) days. 8 capsule 1    Labs pending  No results found for this or any previous visit (from the past 48 hour(s)). No results found.   Review of Systems  Constitutional: Negative for fever and chills.  Respiratory: Negative for shortness of breath and wheezing.   Cardiovascular: Negative for chest pain and palpitations.  Gastrointestinal: Negative for nausea and vomiting.   Vitals on arrival to short stay  Physical Exam  Constitutional: She is cooperative.   Older appearing female NAD   Cardiovascular: S1 normal and S2 normal.   Respiratory: Effort normal. No respiratory distress.  GI:  Soft, NTND, + BS  Musculoskeletal:  Right Lower Extremity    Pain medial, lateral and anterior R knee    ROM 8-105   No instability    + quad atrophy    Mild edema   Surgical wounds stable      Neurological: She is alert.     Assessment/Plan  54 y/o female s/p ORIF complex R tibial plateau fracture complicated by poorly controlled diabetes like due to chronic autoimmune disease and tx   OR for R knee scope and ROH Only proceed if sugars <200 Pt at high risk of infection and persistent complications with significantly elevated sugar levels Ultimately this is an elective procedure so low threshold to postpone  F/u labs Recheck vitamin D   Mearl LatinKeith W. Akhila Mahnken, PA-C [facility] [phone] (P) 01/09/2015, 7:59 AM

## 2015-01-09 NOTE — Anesthesia Procedure Notes (Addendum)
Procedure Name: Intubation Date/Time: 01/09/2015 11:29 AM Performed by: Rogelia BogaMUELLER, Shahzaib Azevedo P Pre-anesthesia Checklist: Patient identified, Emergency Drugs available, Suction available and Patient being monitored Patient Re-evaluated:Patient Re-evaluated prior to inductionOxygen Delivery Method: Circle system utilized Preoxygenation: Pre-oxygenation with 100% oxygen Intubation Type: IV induction Ventilation: Mask ventilation without difficulty and Oral airway inserted - appropriate to patient size Laryngoscope Size: Mac and 4 Grade View: Grade II Tube type: Oral Number of attempts: 1 Airway Equipment and Method: Stylet Placement Confirmation: ETT inserted through vocal cords under direct vision,  positive ETCO2 and breath sounds checked- equal and bilateral Secured at: 20 cm Tube secured with: Tape Dental Injury: Teeth and Oropharynx as per pre-operative assessment

## 2015-01-09 NOTE — Transfer of Care (Signed)
Immediate Anesthesia Transfer of Care Note  Patient: Mindi CurlingMary Lindy  Procedure(s) Performed: Procedure(s): HARDWARE REMOVAL,RIGHT PROXIMAL TIBIAL (Right) ARTHROSCOPY KNEE RIGHT  (Right) RIGHT RING FINGER AND LEFT RING FINGER STEROID INJECTION (Bilateral)  Patient Location: PACU  Anesthesia Type:General  Level of Consciousness: awake, alert , oriented and patient cooperative  Airway & Oxygen Therapy: Patient Spontanous Breathing and Patient connected to nasal cannula oxygen  Post-op Assessment: Report given to RN, Post -op Vital signs reviewed and stable and Patient moving all extremities X 4  Post vital signs: Reviewed and stable  Last Vitals:  Filed Vitals:   01/09/15 0847  BP: 112/74  Pulse: 76  Temp: 37 C  Resp: 20    Complications: No apparent anesthesia complications

## 2015-01-09 NOTE — Brief Op Note (Signed)
01/09/2015  11:23 AM  PATIENT:  Autumn CurlingMary Daniels  54 y.o. female  PRE-OPERATIVE DIAGNOSIS:   1. SYMPTOMATIC HARDWARE RIGHT TIBIA 2. RIGHT KNEE INTERNAL DERANGEMENT 3. RIGHT RINGER TRIGGERING 4. LEFT RING FINGER TRIGGERING  POST-OPERATIVE DIAGNOSIS:   1. SYMPTOMATIC HARDWARE RIGHT TIBIA 2. RIGHT KNEE TRICOMPARTMENTAL DJD 3. RIGHT MEDIAL PLICA SYNDROME 4. RIGHT RINGER TRIGGERING 5. LEFT RING FINGER TRIGGERING  PROCEDURE:  Procedure(s): 1. HARDWARE REMOVAL,RIGHT PROXIMAL TIBIA, MEDIAL AND LATERAL (Right) 2. ARTHROSCOPY KNEE (Right) WITH CHONDROPLASTY ALL THREE COMPARTMENTS AND PARTIAL SYNOVECTOMY 3. MEDIAL PLICA EXCISION 4. INJECTION RIGHT RING FINGER TRIGGER 5. INJECTION LEFT RING FINGER TRIGGER   SURGEON:  Surgeon(s) and Role:    * Myrene GalasMichael Lakedra Washington, MD - Primary  PHYSICIAN ASSISTANT: Montez MoritaKeith Paul, PA-C  ANESTHESIA:   general  I/O:     SPECIMEN:  No Specimen  TOURNIQUET:  50 MINUTES  DICTATION: .Other Dictation: Dictation Number [id number]

## 2015-01-09 NOTE — Discharge Instructions (Signed)
[facility] Discharge Instructions   General Discharge Instructions  WEIGHT BEARING STATUS: As tolerated  RANGE OF MOTION/ACTIVITY: As tolerated   PAIN MEDICATION USE AND EXPECTATIONS  You have likely been given narcotic medications to help control your pain.  After a traumatic event that results in an fracture (broken bone) with or without surgery, it is ok to use narcotic pain medications to help control one's pain.  We understand that everyone responds to pain differently and each individual patient will be evaluated on a regular basis for the continued need for narcotic medications. Ideally, narcotic medication use should last no more than 6-8 weeks (coinciding with fracture healing).   As a patient it is your responsibility as well to monitor narcotic medication use and report the amount and frequency you use these medications when you come to your office visit.   We would also advise that if you are using narcotic medications, you should take a dose prior to therapy to maximize you participation.  IF YOU ARE ON NARCOTIC MEDICATIONS IT IS NOT PERMISSIBLE TO OPERATE A MOTOR VEHICLE (MOTORCYCLE/CAR/TRUCK/MOPED) OR HEAVY MACHINERY DO NOT MIX NARCOTICS WITH OTHER CNS (CENTRAL NERVOUS SYSTEM) DEPRESSANTS SUCH AS ALCOHOL  Diet: as you were eating previously.  Can use over the counter stool softeners and bowel preparations, such as Miralax, to help with bowel movements.  Narcotics can be constipating.  Be sure to drink plenty of fluids  Wound Care: daily dressing changes starting on 01/12/2015. See instructions below    STOP SMOKING OR USING NICOTINE PRODUCTS!!!!  As discussed nicotine severely impairs your body's ability to heal surgical and traumatic wounds but also impairs bone healing.  Wounds and bone heal by forming microscopic blood vessels (angiogenesis) and nicotine is a vasoconstrictor (essentially, shrinks blood vessels).  Therefore, if vasoconstriction occurs to these  microscopic blood vessels they essentially disappear and are unable to deliver necessary nutrients to the healing tissue.  This is one modifiable factor that you can do to dramatically increase your chances of healing your injury.    (This means no smoking, no nicotine gum, patches, etc)  DO NOT USE NONSTEROIDAL ANTI-INFLAMMATORY DRUGS (NSAID'S)  Using products such as Advil (ibuprofen), Aleve (naproxen), Motrin (ibuprofen) for additional pain control during fracture healing can delay and/or prevent the healing response.  If you would like to take over the counter (OTC) medication, Tylenol (acetaminophen) is ok.  However, some narcotic medications that are given for pain control contain acetaminophen as well. Therefore, you should not exceed more than 4000 mg of tylenol in a day if you do not have liver disease.  Also note that there are may OTC medicines, such as cold medicines and allergy medicines that my contain tylenol as well.  If you have any questions about medications and/or interactions please ask your doctor/PA or your pharmacist.      ICE AND ELEVATE INJURED/OPERATIVE EXTREMITY  Using ice and elevating the injured extremity above your heart can help with swelling and pain control.  Icing in a pulsatile fashion, such as 20 minutes on and 20 minutes off, can be followed.    Do not place ice directly on skin. Make sure there is a barrier between to skin and the ice pack.    Using frozen items such as frozen peas works well as the conform nicely to the are that needs to be iced.  USE AN ACE WRAP OR TED HOSE FOR SWELLING CONTROL  In addition to icing and elevation, Ace wraps or TED  hose are used to help limit and resolve swelling.  It is recommended to use Ace wraps or TED hose until you are informed to stop.    When using Ace Wraps start the wrapping distally (farthest away from the body) and wrap proximally (closer to the body)   Example: If you had surgery on your leg or thing and you do not  have a splint on, start the ace wrap at the toes and work your way up to the thigh        If you had surgery on your upper extremity and do not have a splint on, start the ace wrap at your fingers and work your way up to the upper arm  IF YOU ARE IN A SPLINT OR CAST DO NOT REMOVE IT FOR ANY REASON   If your splint gets wet for any reason please contact the office immediately. You may shower in your splint or cast as long as you keep it dry.  This can be done by wrapping in a cast cover or garbage back (or similar)  Do Not stick any thing down your splint or cast such as pencils, money, or hangers to try and scratch yourself with.  If you feel itchy take benadryl as prescribed on the bottle for itching  IF YOU ARE IN A CAM BOOT (BLACK BOOT)  You may remove boot periodically. Perform daily dressing changes as noted below.  Wash the liner of the boot regularly and wear a sock when wearing the boot. It is recommended that you sleep in the boot until told otherwise  CALL THE OFFICE WITH ANY QUESTIONS OR CONCERTS: [phone]     Discharge Pin Site Instructions  Dress pins daily with Kerlix roll starting on POD 2. Wrap the Kerlix so that it tamps the skin down around the pin-skin interface to prevent/limit motion of the skin relative to the pin.  (Pin-skin motion is the primary cause of pain and infection related to external fixator pin sites).  Remove any crust or coagulum that may obstruct drainage with a saline moistened gauze or soap and water.  After POD 3, if there is no discernable drainage on the pin site dressing, the interval for change can by increased to every other day.  You may shower with the fixator, cleaning all pin sites gently with soap and water.  If you have a surgical wound this needs to be completely dry and without drainage before showering.  The extremity can be lifted by the fixator to facilitate wound care and transfers.  Notify the office/Doctor if you experience  increasing drainage, redness, or pain from a pin site, or if you notice purulent (thick, snot-like) drainage.  Discharge Wound Care Instructions  Do NOT apply any ointments, solutions or lotions to pin sites or surgical wounds.  These prevent needed drainage and even though solutions like hydrogen peroxide kill bacteria, they also damage cells lining the pin sites that help fight infection.  Applying lotions or ointments can keep the wounds moist and can cause them to breakdown and open up as well. This can increase the risk for infection. When in doubt call the office.  Surgical incisions should be dressed daily.  If any drainage is noted, use one layer of adaptic, then gauze, Kerlix, and an ace wrap.  Once the incision is completely dry and without drainage, it may be left open to air out.  Showering may begin 36-48 hours later.  Cleaning gently with soap and water.  Traumatic wounds should be dressed daily as well.    One layer of adaptic, gauze, Kerlix, then ace wrap.  The adaptic can be discontinued once the draining has ceased    If you have a wet to dry dressing: wet the gauze with saline the squeeze as much saline out so the gauze is moist (not soaking wet), place moistened gauze over wound, then place a dry gauze over the moist one, followed by Kerlix wrap, then ace wrap.

## 2015-01-09 NOTE — Anesthesia Preprocedure Evaluation (Addendum)
Anesthesia Evaluation  Patient identified by MRN, date of birth, ID band Patient awake    Reviewed: Allergy & Precautions, NPO status , Patient's Chart, lab work & pertinent test results  Airway Mallampati: II  TM Distance: >3 FB Neck ROM: Full    Dental  (+) Dental Advisory Given, Teeth Intact   Pulmonary former smoker,    breath sounds clear to auscultation       Cardiovascular negative cardio ROS   Rhythm:Regular Rate:Normal     Neuro/Psych negative neurological ROS     GI/Hepatic Neg liver ROS, hiatal hernia, GERD  ,  Endo/Other  diabetes, Poorly Controlled, Type 2, Insulin DependentHypothyroidism   Renal/GU negative Renal ROS     Musculoskeletal  (+) Fibromyalgia -  Abdominal   Peds  Hematology negative hematology ROS (+)   Anesthesia Other Findings   Reproductive/Obstetrics                            Anesthesia Physical Anesthesia Plan  ASA: II  Anesthesia Plan: General   Post-op Pain Management:    Induction: Intravenous  Airway Management Planned: Oral ETT  Additional Equipment:   Intra-op Plan:   Post-operative Plan: Extubation in OR  Informed Consent: I have reviewed the patients History and Physical, chart, labs and discussed the procedure including the risks, benefits and alternatives for the proposed anesthesia with the patient or authorized representative who has indicated his/her understanding and acceptance.   Dental advisory given  Plan Discussed with: CRNA  Anesthesia Plan Comments:        Anesthesia Quick Evaluation

## 2015-01-09 NOTE — Op Note (Signed)
NAMMindi Daniels:  Daniels, Autumn                 ACCOUNT NO.:  [id number]  MEDICAL RECORD NO.:  [id number]  LOCATION:  [facility]                         FACILITY:  [facility]  PHYSICIAN:  Doralee AlbinoMichael H. Carola FrostHandy, M.D. DATE OF BIRTH:  1960-11-14  DATE OF PROCEDURE:  01/09/2015 DATE OF DISCHARGE:  01/09/2015                              OPERATIVE REPORT   PREOPERATIVE DIAGNOSES: 1. Symptomatic hardware, right tibia. 2. Right knee internal derangement. 3. Right ring finger triggering and left ring finger triggering.  POSTOPERATIVE DIAGNOSES: 1. Symptomatic hardware, right tibia. 2. Right knee tricompartmental degenerative joint disease severe     posttraumatic. 3. Right medial plica syndrome. 4. Right ring finger triggering and left ring finger triggering.  PROCEDURE: 1. Removal of hardware, right tibia; medial and lateral. 2. Arthroscopic debridement chondroplasty of all 3 compartments;     medial, lateral, and patellofemoral. 3. Arthroscopic medial plica excision. 4. Right ring finger trigger injection with 0.5 mL of Depo-Medrol. 5. Left ring finger trigger injection with 0.5 mL of Depo-Medrol.  SURGEON:  Doralee AlbinoMichael H. Carola FrostHandy, M.D.  ASSISTANT:  Mearl LatinKeith W Paul, PA-C.  ANESTHESIA:  General.  SPECIMENS:  None.  DISPOSITION:  To PACU.  CONDITION:  Stable.  BRIEF SUMMARY AND INDICATION FOR PROCEDURE:  Autumn Daniels is a very pleasant, 54 year old female with lupus and diabetes who sustained a severely depressed bicondylar plateau fracture last August.  She has gone on to unite this fracture, but has continued to have difficulty with the lower extremity including pain, swelling, and a sense of instability.  She underwent an MRI, which was negative for tendon, ligament, or fracture involving the ankle and foot, but did demonstrate signs consistent with weightbearing osteopenia among others.  At this time, she has significant pain directly over her hardware as well as medial joint line tenderness and MRI was not  deemed appropriate because of the adjacent medial plate in addition to the lateral plate and screws.  I did discuss with her the risks and benefits of removal with arthroscopic evaluation and debridement, including the potential to find arthritis that would ultimately require total knee replacement, infection, loss of motion, recurrence of symptoms, contracture, DVT, PE, and multiple others, and she did wish to proceed.  BRIEF SUMMARY OF PROCEDURE:  The patient was taken to the operating room.  After administration of preoperative antibiotics, the right lower extremity was prepped and draped in usual sterile fashion.  I actually began with the trigger fingers where a 23-gauge needle was used to place down to the flexor tendon sheath.  After a prep with alcohol and Betadine, the needle was advanced down to the tendon sheath and advanced through this 0.5 mL were injected, and I was able to palpate fill of the sheath proximally.  In a similar fashion, attention was turned to the left ring finger where again the distal transverse palmar crease was prepped with alcohol and Betadine and then the 23-gauge needle placed just through the flexor tendon sheath and 0.5 mL injected with good fill of the sheath.  Attention was then turned distally to the knee where a chlorhexidine scrub and then Betadine scrub and paint were performed including a standard prep and drape.  The time-out was repeated and then medial and lateral portals established, scope advanced into the knee, and diagnostic arthroscopy performed.  There was considerable chondromalacia visible immediately at the trochlea with grade 3 and 4 changes.  There were some loose fibrillations and rather extensive scar that was debrided with the arthroscopic shaver.  Medially, there was medial plication over to the area of the medial femoral condyle and this band was clearly distinct from the synovium.  I did use the shaver to debride this  and free up any scar tissue impinging on the medial femoral condyle where there were certainly degenerative changes present.  The medial compartment had a nickel-sized area of severe DJD, but the meniscus itself was well preserved.  The femoral articulating surface actually appeared to be in reasonable condition, but the tibial wand did require arthroscopic debridement with a shaver to remove these areas of fibrillation and some small grade 4 change in addition to the larger area of grade 3.  The lateral compartment was then entered and here again there was rather sizable area of chondromalacia involving the tibial surface.  The arthroscopic shaver was used to debride this and it was over 60% of the surface involvement.  Meniscus was intact and appeared to be well preserved from its repair at the time of surgery. The femoral cartilage was soft, but had no loose chondral flaps. Attention was turned back to the patellofemoral joint where large areas of synovitis protruded out on to the articulating surfaces and could clearly be pinched between them.  This area was debrided back to eliminate this and this did reveal more chondromalacia on the trochlea that was debrided with the shaver back to a stable surface as well. Instruments were then withdrawn from the knee.  The lateral incision was remade with sparing of a skin bridge at the corner because of the lack of hardware in this area.  The medial incision was quite a bit smaller because here again hardware was only placed distally.  We exposed down to the plates and removed the screws and hardware without complications.  Montez Morita, PA-C assisted me and we were able to expedite the procedure by working on both medial and lateral sides simultaneously for closure. He did assist with exposure and his assistance was also used for applying varus and valgus stress during arthroscopic portions of the procedure.  The knee and incisions were then  injected with a mixture of Marcaine and epi using quarter strength.  Sterile gently compressive dressing was applied.  Tourniquet was up for less than 1 hour.  The patient was taken to the PACU in stable condition.  PROGNOSIS:  Ms. Vanostrand will be weightbearing as tolerated.  We would anticipate her to enjoy some relief from the debridement of these multiple lesions in the synovitis, but her future likely holds a total knee replacement in the near rather than longer-term timeframe.  She will not require formal DVT prophylaxis other than aspirin antiplatelet therapy at discharge.  At this time, we are anticipating her to go home.     Doralee Albino. Carola Frost, M.D.     [facility]/MEDQ  D:  01/09/2015  T:  01/09/2015  Job:  [id number]

## 2015-01-09 NOTE — Anesthesia Postprocedure Evaluation (Signed)
  Anesthesia Post-op Note  Patient: Autumn Daniels  Procedure(s) Performed: Procedure(s): HARDWARE REMOVAL,RIGHT PROXIMAL TIBIAL (Right) ARTHROSCOPY KNEE RIGHT  (Right) RIGHT RING FINGER AND LEFT RING FINGER STEROID INJECTION (Bilateral)  Patient Location: PACU  Anesthesia Type:General  Level of Consciousness: awake and alert   Airway and Oxygen Therapy: Patient Spontanous Breathing  Post-op Pain: mild  Post-op Assessment: Post-op Vital signs reviewed       RLE Sensation: Full sensation      Post-op Vital Signs: Reviewed  Last Vitals:  Filed Vitals:   01/09/15 1459  BP: 123/73  Pulse: 69  Temp:   Resp: 14    Complications: No apparent anesthesia complications

## 2015-01-10 ENCOUNTER — Encounter (HOSPITAL_COMMUNITY): Payer: Self-pay | Admitting: Orthopedic Surgery

## 2015-01-10 LAB — VITAMIN D 25 HYDROXY (VIT D DEFICIENCY, FRACTURES): Vit D, 25-Hydroxy: 41.8 ng/mL (ref 30.0–100.0)

## 2015-01-13 LAB — VITAMIN D 1,25 DIHYDROXY
VITAMIN D2 1, 25 (OH): 62 pg/mL
Vitamin D 1, 25 (OH)2 Total: 70 pg/mL

## 2015-05-02 LAB — HEMOGLOBIN A1C: HEMOGLOBIN A1C: 10.9

## 2015-05-02 LAB — TSH: TSH: 67.47 u[IU]/mL — AB (ref <=5.90)

## 2015-07-26 ENCOUNTER — Ambulatory Visit (INDEPENDENT_AMBULATORY_CARE_PROVIDER_SITE_OTHER): Payer: BLUE CROSS/BLUE SHIELD

## 2015-07-26 ENCOUNTER — Ambulatory Visit (INDEPENDENT_AMBULATORY_CARE_PROVIDER_SITE_OTHER): Payer: Managed Care, Other (non HMO) | Admitting: Orthopedic Surgery

## 2015-07-26 VITALS — BP 120/74 | HR 86 | Ht 59.0 in | Wt 111.0 lb

## 2015-07-26 DIAGNOSIS — M25561 Pain in right knee: Secondary | ICD-10-CM | POA: Diagnosis not present

## 2015-07-26 DIAGNOSIS — M545 Low back pain, unspecified: Secondary | ICD-10-CM

## 2015-07-26 DIAGNOSIS — M1731 Unilateral post-traumatic osteoarthritis, right knee: Secondary | ICD-10-CM | POA: Diagnosis not present

## 2015-07-26 DIAGNOSIS — R208 Other disturbances of skin sensation: Secondary | ICD-10-CM | POA: Diagnosis not present

## 2015-07-26 DIAGNOSIS — S82141S Displaced bicondylar fracture of right tibia, sequela: Secondary | ICD-10-CM

## 2015-07-26 DIAGNOSIS — R200 Anesthesia of skin: Secondary | ICD-10-CM

## 2015-07-26 MED ORDER — CYCLOBENZAPRINE HCL 5 MG PO TABS: 5.0000 mg | ORAL_TABLET | Freq: Three times a day (TID) | ORAL | Status: DC | PRN

## 2015-07-26 NOTE — Patient Instructions (Signed)
CALL [facility]PT TO SCHEDULE THERAPY VISITS [phone]  WE ARE REFERRING YOU TO NEUROLOGY FOR NERVE CONDUCTION STUDIES

## 2015-07-26 NOTE — Progress Notes (Signed)
Chief Complaint  Patient presents with  . 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 She underwent spanning external fixator and then transferred to orthopedic traumatologist. She underwent manipulation of the foramen manipulation of the fracture and then underwent open treatment internal fixation of the tibial plateau by Dr. [place]y [place]. She subsequently underwent hardware removal and arthroscopy at which time was found she had degenerative arthritis of the knee.  She presents for evaluation for possible knee replacement.  She complains of medial knee pain. Numbness in the medial part of her lower leg. Back pain. She complains of stiffness in the knee. She complains of inability to perform activities which she deems important to her such as simple walking for any significant period of time.  Her review of systems reveals that she has symptoms of elevated sugar and diabetes. Fever and chills have not been a significant complaint no unexpected weight loss. Joint pain and swelling have been noted the knee does feel somewhat unstable. The skin has been normal regarding the knee.   ROS as above  Past Medical History  Diagnosis Date  . Thyroid disease     has had radioactive iodine treatment for hyperthyroidism in 1990  . Fibromyalgia   . Diabetes mellitus without complication (HCC)     type 2  . Stevens-Johnson syndrome (HCC)   . Pneumonia   . Frequent UTI     due to small urethra  . GERD (gastroesophageal reflux disease)   . History of hiatal hernia   . Lupus (HCC)   . Vitamin D deficiency 07/07/2014  . Anxiety   . Depression   . Headache     hx of migraines  . Anemia   . Diarrhea     as of 12/2014 - pt states that she's had diarrhea for a year    Past Surgical History  Procedure Laterality Date  . Tonsillectomy    . Cholecystectomy      2 bifurcations of liver during lap chole  . Liver surgery      x4 due to bifurcations from lap chole  . External fixation leg Right 06/30/2014    Procedure: EXTERNAL FIXATION LEG;  Surgeon: Vickki HearingStanley E Irine Heminger, MD;  Location: [facility];  Service: Orthopedics;  Laterality: Right;  synthes large fragment exteranal fixation  c arm   . External fixation leg Right 07/07/2014    Procedure: REVISION EXTERNAL FIXATION LEG;  Surgeon: Myrene GalasMichael Handy, MD;  [facility];  Service: Orthopedics;  Laterality: Right;  . Orif tibia plateau Right 07/18/2014    Procedure: RIGHT OPEN REDUCTION INTERNAL FIXATION (ORIF) TIBIA FRACTURE;  Surgeon: Myrene GalasMichael Handy, MD;  [facility];  Service: Orthopedics;  Laterality: Right;  ORIF of bicondylar plateau fracture  . External fixation removal Right 07/18/2014    Procedure: REMOVAL EXTERNAL FIXATION LEG;  Surgeon: Myrene GalasMichael Handy, MD;  [facility];  Service: Orthopedics;  Laterality: Right;  . Hardware removal Right 01/09/2015    Procedure: HARDWARE REMOVAL,RIGHT PROXIMAL TIBIAL;  Surgeon: Myrene GalasMichael Handy, MD;  [facility];  Service: Orthopedics;  Laterality: Right;  . Knee arthroscopy Right 01/09/2015    Procedure: ARTHROSCOPY KNEE RIGHT ;  Surgeon: Myrene GalasMichael Handy, MD;  [facility];  Service: Orthopedics;  Laterality: Right;  . Steriod injection Bilateral 01/09/2015    Procedure: RIGHT RING FINGER AND LEFT RING FINGER STEROID  INJECTION;  Surgeon: Myrene Galas, MD;  Location: [facility];  Service: Orthopedics;  Laterality: Bilateral;   Family History  Problem Relation Age of Onset  . Colon cancer Mother   . Lung disease Father    Social History  Substance Use Topics  . Smoking status: Former Smoker -- 0.50 packs/day for 30 years    Types: Cigarettes    Quit date: 05/29/2014  . Smokeless tobacco: Never Used  . Alcohol Use: No    Current outpatient prescriptions:  .  ARMOUR THYROID 60 MG tablet, Take 150 mg by mouth daily. , Disp: , Rfl: 2 .  gabapentin  (NEURONTIN) 300 MG capsule, Take 1 capsule (300 mg total) by mouth 3 (three) times daily., Disp: 90 capsule, Rfl: 0 .  Insulin Degludec 200 UNIT/ML SOPN, Inject into the skin., Disp: , Rfl:  .  insulin lispro (HUMALOG KWIKPEN) 100 UNIT/ML KiwkPen, Inject into the skin., Disp: , Rfl:  .  traMADol (ULTRAM) 50 MG tablet, Take 100 mg by mouth every 8 (eight) hours as needed for moderate pain. , Disp: , Rfl:  .  Vitamin D, Ergocalciferol, (DRISDOL) 50000 UNITS CAPS capsule, Take 1 capsule (50,000 Units total) by mouth every 7 (seven) days., Disp: 8 capsule, Rfl: 1 .  cyclobenzaprine (FLEXERIL) 5 MG tablet, Take 1 tablet (5 mg total) by mouth every 8 (eight) hours as needed for muscle spasms., Disp: 30 tablet, Rfl: 0  BP 120/74 mmHg  Pulse 86  Ht 4\' 11"  (1.499 m)  Wt 111 lb (50.349 kg)  BMI 22.41 kg/m2  Physical Exam Once the patient has a small frame her developmental status is normal no gross deformities in the upper extremities and the right lower extremity alignment is normal grooming is excellent.  Peripheral vascular system pulse and temperature normal without edema or swelling in the right leg in the lower leg near the ankle.  The patient is ambulatory but she does have a limp favoring the right lower extremity with decreased stride length, decreased knee flexion throughout gait. Decreased weightbearing phase.  Overall alignment of the right leg looks to be normal. Her knee flexion is limited and is painful and measures approximately 115 without significant flexion contracture there is crepitation. There is pain throughout the range of motion.  She is stable varus valgus and medial lateral. Muscle tone is normal. No gross atrophy or tremor noted.  In comparison her left leg is noted to have normal range of motion stability strength muscle tone and alignment  She has decreased sensation in the L4 dermatome. She is otherwise oriented to time person and place and her mood seems depressed her  affect seems flat.   Ortho Exam   ASSESSMENT: My personal interpretation of the images:  14 valgus. Irregularity of the lateral plateau. Fracture healing did appear to occur. She has osteophyte formation secondary bone changes and osteopenia in the joint.    PLAN Problem #1 osteoarthritis right knee problem  #2 tibial plateau fracture sequelae problem  #3 back pain problem  #4 radicular pain right leg problem  #5 lower back pain  We decided to put her on Flexeril for ongoing back spasms. She had tried Valium and Robaxin in the past. We also would like a nerve conduction study on her lower extremities to evaluate her for radicular pain and we had her go to therapy and an x-ray of her back when she comes in next time  As I talked to her about converting tibial plateau fracture to total  knee that is not a part of my practice and a subspecialist joint replacement surgery would better serve her for that type of surgery.

## 2015-07-27 ENCOUNTER — Telehealth: Payer: Self-pay | Admitting: *Deleted

## 2015-07-27 NOTE — Telephone Encounter (Signed)
REFERRAL FAXED TO DR DOONQUAH 

## 2015-08-01 ENCOUNTER — Telehealth: Payer: Self-pay | Admitting: Orthopedic Surgery

## 2015-08-01 NOTE — Telephone Encounter (Signed)
Irving BurtonEmily from Dr. Ronal Fearoonquah's office called saying that they were not in network  for this patient's Insurance

## 2015-08-02 ENCOUNTER — Other Ambulatory Visit: Payer: Self-pay | Admitting: *Deleted

## 2015-08-02 ENCOUNTER — Telehealth: Payer: Self-pay | Admitting: Orthopedic Surgery

## 2015-08-02 DIAGNOSIS — R200 Anesthesia of skin: Secondary | ICD-10-CM

## 2015-08-02 NOTE — Telephone Encounter (Signed)
PATIENT AWARE

## 2015-08-02 NOTE — Telephone Encounter (Signed)
Try 2 (10 mg)

## 2015-08-02 NOTE — Telephone Encounter (Signed)
Routing to Dr Harrison 

## 2015-08-02 NOTE — Telephone Encounter (Signed)
Ms. Autumn Daniels called to say that the Flexeril really isn't helping that much. She is asking if you think she should maybe take 2 at a time or would  you do a prescription for Vicodin. She said whatever you think is best.

## 2015-08-06 ENCOUNTER — Ambulatory Visit (HOSPITAL_COMMUNITY): Payer: BLUE CROSS/BLUE SHIELD | Attending: Orthopedic Surgery | Admitting: Physical Therapy

## 2015-08-06 NOTE — Telephone Encounter (Signed)
NEW REFERRAL SENT TO [facility]

## 2015-08-27 ENCOUNTER — Encounter: Payer: Self-pay | Admitting: "Endocrinology

## 2015-08-27 ENCOUNTER — Ambulatory Visit (INDEPENDENT_AMBULATORY_CARE_PROVIDER_SITE_OTHER): Payer: Managed Care, Other (non HMO) | Admitting: "Endocrinology

## 2015-08-27 VITALS — BP 117/75 | HR 85 | Ht 59.0 in | Wt 109.0 lb

## 2015-08-27 DIAGNOSIS — E108 Type 1 diabetes mellitus with unspecified complications: Secondary | ICD-10-CM | POA: Diagnosis not present

## 2015-08-27 DIAGNOSIS — E890 Postprocedural hypothyroidism: Secondary | ICD-10-CM

## 2015-08-27 DIAGNOSIS — E1065 Type 1 diabetes mellitus with hyperglycemia: Secondary | ICD-10-CM | POA: Diagnosis not present

## 2015-08-27 DIAGNOSIS — IMO0002 Reserved for concepts with insufficient information to code with codable children: Secondary | ICD-10-CM

## 2015-08-27 MED ORDER — LEVOTHYROXINE SODIUM 88 MCG PO TABS: 88.0000 ug | ORAL_TABLET | Freq: Every day | ORAL | Status: DC

## 2015-08-27 NOTE — Progress Notes (Signed)
Subjective:    Patient ID: Autumn Daniels, female    DOB: 14-Mar-1961. Patient is being seen in consultation for management of diabetes requested by  WHITE, MARSHA L, NP  Past Medical History  Diagnosis Date  . Thyroid disease     has had radioactive iodine treatment for hyperthyroidism in 1990  . Fibromyalgia   . Diabetes mellitus without complication (HCC)     type 2  . Stevens-Johnson syndrome (HCC)   . Pneumonia   . Frequent UTI     due to small urethra  . GERD (gastroesophageal reflux disease)   . History of hiatal hernia   . Lupus (HCC)   . Vitamin D deficiency 07/07/2014  . Anxiety   . Depression   . Headache     hx of migraines  . Anemia   . Diarrhea     as of 12/2014 - pt states that she's had diarrhea for a year   Past Surgical History  Procedure Laterality Date  . Tonsillectomy    . Cholecystectomy      2 bifurcations of liver during lap chole  . Liver surgery      x4 due to bifurcations from lap chole  . External fixation leg Right 06/30/2014    Procedure: EXTERNAL FIXATION LEG;  Surgeon: Vickki HearingStanley E Harrison, MD;  Location: [facility];  Service: Orthopedics;  Laterality: Right;  synthes large fragment exteranal fixation  c arm   . External fixation leg Right 07/07/2014    Procedure: REVISION EXTERNAL FIXATION LEG;  Surgeon: Myrene GalasMichael Handy, MD;  [facility];  Service: Orthopedics;  Laterality: Right;  . Orif tibia plateau Right 07/18/2014    Procedure: RIGHT OPEN REDUCTION INTERNAL FIXATION (ORIF) TIBIA FRACTURE;  Surgeon: Myrene GalasMichael Handy, MD;  [facility];  Service: Orthopedics;  Laterality: Right;  ORIF of bicondylar plateau fracture  . External fixation removal Right 07/18/2014    Procedure: REMOVAL EXTERNAL FIXATION LEG;  Surgeon: Myrene GalasMichael Handy, MD;  [facility];  Service: Orthopedics;  Laterality: Right;  . Hardware removal Right 01/09/2015    Procedure: HARDWARE REMOVAL,RIGHT PROXIMAL TIBIAL;  Surgeon: Myrene GalasMichael Handy, MD;  [facility];  Service:  Orthopedics;  Laterality: Right;  . Knee arthroscopy Right 01/09/2015    Procedure: ARTHROSCOPY KNEE RIGHT ;  Surgeon: Myrene GalasMichael Handy, MD;  [facility];  Service: Orthopedics;  Laterality: Right;  . Steriod injection Bilateral 01/09/2015    Procedure: RIGHT RING FINGER AND LEFT RING FINGER STEROID INJECTION;  Surgeon: Myrene GalasMichael Handy, MD;  [facility];  Service: Orthopedics;  Laterality: Bilateral;   Social History   Social History  . Marital Status: Divorced    Spouse Name: N/A  . Number of Children: N/A  . Years of Education: N/A   Social History Main Topics  . Smoking status: Former Smoker -- 0.50 packs/day for 30 years    Types: Cigarettes    Quit date: 05/29/2014  . Smokeless tobacco: Never Used  . Alcohol Use: No  . Drug Use: No  . Sexual Activity: Not Asked   Other Topics Concern  . None   Social History Narrative   Outpatient Encounter Prescriptions as of 08/27/2015  Medication Sig  . gabapentin (NEURONTIN) 300 MG capsule Take 1 capsule (300 mg total) by mouth 3 (three) times daily.  . Insulin Degludec 200 UNIT/ML SOPN Inject 20 Units into the skin at bedtime.  . insulin lispro (HUMALOG KWIKPEN) 100 UNIT/ML KiwkPen Inject 8-14 Units into the skin 3 (three) times daily  with meals.  . traMADol (ULTRAM) 50 MG tablet Take 100 mg by mouth every 8 (eight) hours as needed for moderate pain.   . Vitamin D, Ergocalciferol, (DRISDOL) 50000 UNITS CAPS capsule Take 1 capsule (50,000 Units total) by mouth every 7 (seven) days.  . [DISCONTINUED] ARMOUR THYROID 60 MG tablet Take 150 mg by mouth daily.   . cyclobenzaprine (FLEXERIL) 5 MG tablet Take 1 tablet (5 mg total) by mouth every 8 (eight) hours as needed for muscle spasms.  Marland Kitchen levothyroxine (SYNTHROID) 88 MCG tablet Take 1 tablet (88 mcg total) by mouth daily before breakfast.   No facility-administered encounter medications on file as of 08/27/2015.   ALLERGIES: Allergies  Allergen Reactions  . Penicillins Other (See  Comments)    Patient has SJS which prevents taking this medications  . 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  . Metformin And Related    VACCINATION STATUS:  There is no immunization history on file for this patient.  Diabetes She presents for her initial diabetic visit. She has type 1 diabetes mellitus. Onset time: She was diagnosed at approximate age of 50 years. Her disease course has been worsening. Hypoglycemia symptoms include tremors. Pertinent negatives for hypoglycemia include no confusion, headaches, pallor or seizures. Associated symptoms include blurred vision, fatigue, polydipsia and polyuria. Pertinent negatives for diabetes include no chest pain and no polyphagia. There are no hypoglycemic complications. Symptoms are improving. There are no diabetic complications. Risk factors for coronary artery disease include diabetes mellitus and tobacco exposure. Her weight is decreasing steadily. She is following a diabetic diet. When asked about meal planning, she reported none. She has not had a previous visit with a dietitian. She participates in exercise intermittently. Her home blood glucose trend is fluctuating dramatically. Her overall blood glucose range is >200 mg/dl. An ACE inhibitor/angiotensin II receptor blocker is not being taken.  Thyroid Problem Presents for initial visit. Onset time: She was given RAI therapy for treatment of Graves' disease in 1990, since when she required thyroid hormone replacement. Currently her therapies Armour Thyroid 60 mg 2 and half tablets (150  milligrams) daily. Symptoms include fatigue and tremors. Patient reports no cold intolerance, diarrhea, heat intolerance or palpitations. The symptoms have been worsening. Her past medical history is significant for diabetes. Risk factors include prior iodine 131 therapy.       Review of Systems  Constitutional: Positive for fatigue. Negative for  fever, chills and unexpected weight change.  HENT: Negative for trouble swallowing and voice change.   Eyes: Positive for blurred vision. Negative for visual disturbance.  Respiratory: Negative for cough, shortness of breath and wheezing.   Cardiovascular: Negative for chest pain, palpitations and leg swelling.  Gastrointestinal: Negative for nausea, vomiting and diarrhea.  Endocrine: Positive for polydipsia and polyuria. Negative for cold intolerance, heat intolerance and polyphagia.  Musculoskeletal: Negative for myalgias and arthralgias.  Skin: Negative for color change, pallor, rash and wound.  Neurological: Positive for tremors. Negative for seizures and headaches.  Psychiatric/Behavioral: Negative for suicidal ideas and confusion.    Objective:    BP 117/75 mmHg  Pulse 85  Ht 4\' 11"  (1.499 m)  Wt 109 lb (49.442 kg)  BMI 22.00 kg/m2  SpO2 98%  Wt Readings from Last 3 Encounters:  08/27/15 109 lb (49.442 kg)  07/26/15 111 lb (50.349 kg)  01/09/15 114 lb (51.71 kg)    Physical Exam  Constitutional: She is oriented to person, place,  and time. She appears well-developed.  HENT:  Head: Normocephalic and atraumatic.  Eyes: EOM are normal.  Neck: Normal range of motion. Neck supple. No tracheal deviation present. No thyromegaly present.  Cardiovascular: Normal rate and regular rhythm.   Pulmonary/Chest: Effort normal and breath sounds normal.  Abdominal: Soft. Bowel sounds are normal. There is no tenderness. There is no guarding.  Musculoskeletal: Normal range of motion. She exhibits no edema.  Neurological: She is alert and oriented to person, place, and time. She has normal reflexes. No cranial nerve deficit. Coordination normal.  Skin: Skin is warm and dry. No rash noted. No erythema. No pallor.  Psychiatric: She has a normal mood and affect. Judgment normal.     CMP ( most recent) CMP     Component Value Date/Time   NA 140 01/09/2015 0923   K 3.7 01/09/2015 0923   CL  107 01/09/2015 0923   CO2 23 01/09/2015 0923   GLUCOSE 143* 01/09/2015 0923   BUN 9 01/09/2015 0923   CREATININE 0.42* 01/09/2015 0923   CALCIUM 10.3 01/09/2015 0923   CALCIUM 8.9 07/20/2014 0615   PROT 5.5* 07/20/2014 0615   ALBUMIN 2.4* 07/20/2014 0615   AST 16 07/20/2014 0615   ALT 11 07/20/2014 0615   ALKPHOS 93 07/20/2014 0615   BILITOT 0.6 07/20/2014 0615   GFRNONAA >60 01/09/2015 0923   GFRAA >60 01/09/2015 0923      Assessment & Plan:   1. Uncontrolled type 1 diabetes mellitus with complication (HCC)  - Patient has currently uncontrolled symptomatic type 1 DM since  55  years of age,  with most recent A1c of 10.9 %.  -She does not have recent labs to review. She agrees to go to lab fasting today.   - she  remains at a high risk for more acute and chronic complications of diabetes which include CAD, CVA, CKD, retinopathy, and neuropathy. These are all discussed in detail with the patient.  - I have counseled the patient on diet management  by adopting a carbohydrate restricted/protein rich diet.  - Suggestion is made for patient to avoid simple carbohydrates   from their diet including Cakes , Desserts, Ice Cream,  Soda (  diet and regular) , Sweet Tea , Candies,  Chips, Cookies, Artificial Sweeteners,   and "Sugar-free" Products . This will help patient to have stable blood glucose profile and potentially avoid unintended weight gain.  - I encouraged the patient to switch to  unprocessed or minimally processed complex starch and increased protein intake (animal or plant source), fruits, and vegetables.  - Patient is advised to stick to a routine mealtimes to eat 3 meals  a day and avoid unnecessary snacks ( to snack only to correct hypoglycemia).  - The patient will be scheduled with Norm Salt, RDN, CDE for individualized DM education.  - I have approached patient with the following individualized plan to manage diabetes and patient agrees:   - I  will proceed with  basal/bolus insulin.  -Since she is concerned about overnight hypoglycemia, I will lower her basal insulin  Tresiba to 20  units QHS, and prandial insulin  Humalog to 8 units TIDAC for pre-meal BG readings of 90-150mg /dl, plus patient specific correction dose for unexpected hyperglycemia above 150mg /dl, associated with strict monitoring of glucose  AC and HS. - Patient is warned not to take insulin without proper monitoring per orders. -Adjustment parameters are given for hypo and hyperglycemia in writing. -Patient is encouraged to call clinic for  blood glucose levels less than 70 or above 300 mg /dl. -Insulin is exclusive choice of therapy she has to treat diabetes. -I gave her a brochure and information on continues glucose monitoring program, DEX[facility].  - Patient specific target  A1c;  LDL, HDL, Triglycerides, and  Waist Circumference were discussed in detail.  2) BP/HTN: Controlled. she is not on ACEI therapy.  3) Lipids/HPL:  From February 2017 and her LDL was elevated at 113, she is not on statins. She will be considered on subsequent visits.  4)  Weight/Diet: She does not have weight problem, her BMI is 22. CDE Consult will be initiated , exercise, and detailed carbohydrates information provided.  5. Hypothyroidism following radioiodine therapy. -This is a settled diagnosis for her since 70. - I had a long discussion with her regarding choice of therapy for hypothyroidism. She agrees to switch to Synthroid from Armour Thyroid. I will initiate Synthroid 88 g by mouth every morning. - - We discussed about correct intake of levothyroxine, at fasting, with water, separated by at least 30 minutes from breakfast, and separated by more than 4 hours from calcium, iron, multivitamins, acid reflux medications (PPIs). -Patient is made aware of the fact that thyroid hormone replacement is needed for life, dose to be adjusted by periodic monitoring of thyroid function tests.  6) Chronic Care/Health  Maintenance:  -Patient is encouraged to continue to follow up with Ophthalmology, Podiatrist at least yearly or according to recommendations, and advised to  stay away from smoking. I have recommended yearly flu vaccine and pneumonia vaccination at least every 5 years; moderate intensity exercise for up to 150 minutes weekly; and  sleep for at least 7 hours a day.  - 60 minutes of time was spent on the care of this patient , 50% of which was applied for counseling on diabetes complications and their preventions.  - Patient to bring meter and  blood glucose logs during their next visit.   - I advised patient to maintain close follow up with WHITE, MARSHA L, NP for primary care needs.  Follow up plan: - Return in about 1 week (around 09/03/2015) for diabetes, underactive thyroid, labs today.  Marquis Lunch, MD Phone: [phone]  Fax: [phone]   08/27/2015, 1:38 PM

## 2015-08-29 LAB — LIPID PANEL
HDL: 54 mg/dL (ref 35–70)
LDL CALC: 50 mg/dL
Triglycerides: 132 mg/dL (ref 40–160)

## 2015-08-29 LAB — HEMOGLOBIN A1C: Hemoglobin A1C: 6.1

## 2015-09-04 ENCOUNTER — Ambulatory Visit: Payer: Managed Care, Other (non HMO) | Admitting: "Endocrinology

## 2015-09-19 ENCOUNTER — Ambulatory Visit (INDEPENDENT_AMBULATORY_CARE_PROVIDER_SITE_OTHER): Payer: Self-pay | Admitting: Neurology

## 2015-09-19 ENCOUNTER — Encounter: Payer: Self-pay | Admitting: Neurology

## 2015-09-19 ENCOUNTER — Ambulatory Visit (INDEPENDENT_AMBULATORY_CARE_PROVIDER_SITE_OTHER): Payer: Managed Care, Other (non HMO) | Admitting: Neurology

## 2015-09-19 DIAGNOSIS — M79604 Pain in right leg: Secondary | ICD-10-CM

## 2015-09-19 DIAGNOSIS — M329 Systemic lupus erythematosus, unspecified: Secondary | ICD-10-CM

## 2015-09-19 NOTE — Procedures (Signed)
     HISTORY:  Autumn CurlingMary Daniels is a 55 year old patient with a history of type 1 diabetes. The patient has had a fracture of her right tibia in March 2016, she has required multiple surgeries on the knee. She has developed some back pain and some pain down the right leg. The patient is being evaluated for possible lumbar radiculopathy.  NERVE CONDUCTION STUDIES:  Nerve conduction studies were performed on both lower extremities. The distal motor latencies and motor amplitudes for the peroneal and posterior tibial nerves were within normal limits, with exception of a slightly low motor amplitude for the right peroneal nerve. The nerve conduction velocities for these nerves were also normal. The H reflex latencies were normal. The sensory latencies for the peroneal nerves were within normal limits.   EMG STUDIES:  A limited EMG study was performed on the right lower extremity:  The tibialis anterior muscle reveals 2 to 4K motor units with full recruitment. No fibrillations or positive waves were seen.  The patient refused further EMG testing.   IMPRESSION:  Nerve conduction studies on both lower extremities were relatively unremarkable with exception of a low motor amplitude for the right peroneal nerve. There is no evidence of an overlying peripheral neuropathy. EMG evaluation of the right lower extremity was limited secondary to patient's refusal. Any comment concerning an overlying lumbosacral radiculopathy cannot be made from this study.  Marlan Palau. Keith Windsor Zirkelbach MD 09/19/2015 4:23 PM  [facility] [address] [place], [place] [id number]  Phone [phone] Fax [phone]

## 2015-09-19 NOTE — Progress Notes (Signed)
Please refer to EMG and nerve conduction study procedure note. 

## 2015-09-20 ENCOUNTER — Encounter: Payer: Self-pay | Admitting: "Endocrinology

## 2015-09-20 ENCOUNTER — Ambulatory Visit (INDEPENDENT_AMBULATORY_CARE_PROVIDER_SITE_OTHER): Payer: Managed Care, Other (non HMO) | Admitting: "Endocrinology

## 2015-09-20 VITALS — BP 122/76 | HR 83 | Ht 59.0 in | Wt 108.0 lb

## 2015-09-20 DIAGNOSIS — E890 Postprocedural hypothyroidism: Secondary | ICD-10-CM

## 2015-09-20 DIAGNOSIS — E108 Type 1 diabetes mellitus with unspecified complications: Secondary | ICD-10-CM | POA: Diagnosis not present

## 2015-09-20 DIAGNOSIS — E1065 Type 1 diabetes mellitus with hyperglycemia: Secondary | ICD-10-CM | POA: Diagnosis not present

## 2015-09-20 DIAGNOSIS — IMO0002 Reserved for concepts with insufficient information to code with codable children: Secondary | ICD-10-CM

## 2015-09-20 NOTE — Progress Notes (Signed)
Subjective:    Patient ID: Autumn CurlingMary Ode, female    DOB: 1960-10-02. Patient is being seen in consultation for management of diabetes requested by  WHITE, MARSHA L, NP  Past Medical History  Diagnosis Date  . Thyroid disease     has had radioactive iodine treatment for hyperthyroidism in 1990  . Fibromyalgia   . Diabetes mellitus without complication (HCC)     type 2  . Stevens-Johnson syndrome (HCC)   . Pneumonia   . Frequent UTI     due to small urethra  . GERD (gastroesophageal reflux disease)   . History of hiatal hernia   . Lupus (HCC)   . Vitamin D deficiency 07/07/2014  . Anxiety   . Depression   . Headache     hx of migraines  . Anemia   . Diarrhea     as of 12/2014 - pt states that she's had diarrhea for a year   Past Surgical History  Procedure Laterality Date  . Tonsillectomy    . Cholecystectomy      2 bifurcations of liver during lap chole  . Liver surgery      x4 due to bifurcations from lap chole  . External fixation leg Right 06/30/2014    Procedure: EXTERNAL FIXATION LEG;  Surgeon: Vickki HearingStanley E Harrison, MD;  Location: [facility];  Service: Orthopedics;  Laterality: Right;  synthes large fragment exteranal fixation  c arm   . External fixation leg Right 07/07/2014    Procedure: REVISION EXTERNAL FIXATION LEG;  Surgeon: Myrene GalasMichael Handy, MD;  [facility];  Service: Orthopedics;  Laterality: Right;  . Orif tibia plateau Right 07/18/2014    Procedure: RIGHT OPEN REDUCTION INTERNAL FIXATION (ORIF) TIBIA FRACTURE;  Surgeon: Myrene GalasMichael Handy, MD;  [facility];  Service: Orthopedics;  Laterality: Right;  ORIF of bicondylar plateau fracture  . External fixation removal Right 07/18/2014    Procedure: REMOVAL EXTERNAL FIXATION LEG;  Surgeon: Myrene GalasMichael Handy, MD;  [facility];  Service: Orthopedics;  Laterality: Right;  . Hardware removal Right 01/09/2015    Procedure: HARDWARE REMOVAL,RIGHT PROXIMAL TIBIAL;  Surgeon: Myrene GalasMichael Handy, MD;  [facility];  Service:  Orthopedics;  Laterality: Right;  . Knee arthroscopy Right 01/09/2015    Procedure: ARTHROSCOPY KNEE RIGHT ;  Surgeon: Myrene GalasMichael Handy, MD;  [facility];  Service: Orthopedics;  Laterality: Right;  . Steriod injection Bilateral 01/09/2015    Procedure: RIGHT RING FINGER AND LEFT RING FINGER STEROID INJECTION;  Surgeon: Myrene GalasMichael Handy, MD;  [facility];  Service: Orthopedics;  Laterality: Bilateral;   Social History   Social History  . Marital Status: Divorced    Spouse Name: N/A  . Number of Children: N/A  . Years of Education: N/A   Social History Main Topics  . Smoking status: Former Smoker -- 0.50 packs/day for 30 years    Types: Cigarettes    Quit date: 05/29/2014  . Smokeless tobacco: Never Used  . Alcohol Use: No  . Drug Use: No  . Sexual Activity: Not Asked   Other Topics Concern  . None   Social History Narrative   Outpatient Encounter Prescriptions as of 09/20/2015  Medication Sig  . cyclobenzaprine (FLEXERIL) 5 MG tablet Take 1 tablet (5 mg total) by mouth every 8 (eight) hours as needed for muscle spasms.  Marland Kitchen. gabapentin (NEURONTIN) 300 MG capsule Take 1 capsule (300 mg total) by mouth 3 (three) times daily.  . Insulin Degludec 200 UNIT/ML SOPN Inject 20 Units  into the skin at bedtime.  . insulin lispro (HUMALOG KWIKPEN) 100 UNIT/ML KiwkPen Inject 5-11 Units into the skin 3 (three) times daily with meals.  Marland Kitchen. levothyroxine (SYNTHROID) 88 MCG tablet Take 1 tablet (88 mcg total) by mouth daily before breakfast.  . traMADol (ULTRAM) 50 MG tablet Take 100 mg by mouth every 8 (eight) hours as needed for moderate pain.   . Vitamin D, Ergocalciferol, (DRISDOL) 50000 UNITS CAPS capsule Take 1 capsule (50,000 Units total) by mouth every 7 (seven) days.   No facility-administered encounter medications on file as of 09/20/2015.   ALLERGIES: Allergies  Allergen Reactions  . 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 Sulfa  Antibiotics Other (See Comments)    Patient has SJS which prevents taking this medication  . Metformin And Related    VACCINATION STATUS:  There is no immunization history on file for this patient.  Diabetes She presents for her follow-up diabetic visit. She has type 1 diabetes mellitus. Onset time: She was diagnosed at approximate age of 50 years. Her disease course has been improving. Hypoglycemia symptoms include tremors. Pertinent negatives for hypoglycemia include no confusion, headaches, pallor or seizures. Associated symptoms include fatigue and polydipsia. Pertinent negatives for diabetes include no blurred vision, no chest pain, no foot ulcerations, no polyphagia and no polyuria. There are no hypoglycemic complications. Symptoms are worsening. There are no diabetic complications. Risk factors for coronary artery disease include diabetes mellitus and tobacco exposure. Her weight is stable. She is following a diabetic diet. When asked about meal planning, she reported none. She has not had a previous visit with a dietitian. She participates in exercise intermittently. Frequency home blood tests: I have requested for her to monitor blood glucose 4 times a day, before meals and at bedtime. Blood glucose monitoring compliance is poor. Home blood sugar record trend: She did not bring any meter nor log. She has not been monitoring saying that she did not get her supplies from [employer] .Marland Kitchen. An ACE inhibitor/angiotensin II receptor blocker is not being taken.  Thyroid Problem Presents for initial visit. Onset time: She was given RAI therapy for treatment of Graves' disease in 1990, since when she required thyroid hormone replacement. Currently her therapies Armour Thyroid 60 mg 2 and half tablets (150  milligrams) daily. Symptoms include fatigue and tremors. Patient reports no cold intolerance, diarrhea, heat intolerance or palpitations. The symptoms have been worsening. Her past medical history is  significant for diabetes. Risk factors include prior iodine 131 therapy.       Review of Systems  Constitutional: Positive for fatigue. Negative for fever, chills and unexpected weight change.  HENT: Negative for trouble swallowing and voice change.   Eyes: Negative for blurred vision and visual disturbance.  Respiratory: Negative for cough, shortness of breath and wheezing.   Cardiovascular: Negative for chest pain, palpitations and leg swelling.  Gastrointestinal: Negative for nausea, vomiting and diarrhea.  Endocrine: Positive for polydipsia. Negative for cold intolerance, heat intolerance, polyphagia and polyuria.  Musculoskeletal: Negative for myalgias and arthralgias.  Skin: Negative for color change, pallor, rash and wound.  Neurological: Positive for tremors. Negative for seizures and headaches.  Psychiatric/Behavioral: Negative for suicidal ideas and confusion.    Objective:    BP 122/76 mmHg  Pulse 83  Ht 4\' 11"  (1.499 m)  Wt 108 lb (48.988 kg)  BMI 21.80 kg/m2  Wt Readings from Last 3 Encounters:  09/20/15 108 lb (48.988  kg)  08/27/15 109 lb (49.442 kg)  07/26/15 111 lb (50.349 kg)    Physical Exam  Constitutional: She is oriented to person, place, and time. She appears well-developed.  HENT:  Head: Normocephalic and atraumatic.  Eyes: EOM are normal.  Neck: Normal range of motion. Neck supple. No tracheal deviation present. No thyromegaly present.  Cardiovascular: Normal rate and regular rhythm.   Pulmonary/Chest: Effort normal and breath sounds normal.  Abdominal: Soft. Bowel sounds are normal. There is no tenderness. There is no guarding.  Musculoskeletal: Normal range of motion. She exhibits no edema.  Neurological: She is alert and oriented to person, place, and time. She has normal reflexes. No cranial nerve deficit. Coordination normal.  Skin: Skin is warm and dry. No rash noted. No erythema. No pallor.  Psychiatric: She has a normal mood and affect.  Judgment normal.    Recent Results (from the past 2160 hour(s))  Lipid panel     Status: None   Collection Time: 08/29/15 12:00 AM  Result Value Ref Range   Triglycerides 132 40 - 160 mg/dL   HDL 54 35 - 70 mg/dL   LDL Cholesterol 50 mg/dL  Hemoglobin Z6X     Status: None   Collection Time: 08/29/15 12:00 AM  Result Value Ref Range   Hemoglobin A1C 6.1      Assessment & Plan:   1. Uncontrolled type 1 diabetes mellitus with complication (HCC)  - Patient has currently uncontrolled symptomatic type 1 DM since  55  years of age,  with most recent A1c of  6.1% improving from 10.9 %.    - she  remains at a high risk for more acute and chronic complications of diabetes which include CAD, CVA, CKD, retinopathy, and neuropathy. These are all discussed in detail with the patient.  - I have counseled the patient on diet management  by adopting a carbohydrate restricted/protein rich diet.  - Suggestion is made for patient to avoid simple carbohydrates   from their diet including Cakes , Desserts, Ice Cream,  Soda (  diet and regular) , Sweet Tea , Candies,  Chips, Cookies, Artificial Sweeteners,   and "Sugar-free" Products . This will help patient to have stable blood glucose profile and potentially avoid unintended weight gain.  - I encouraged the patient to switch to  unprocessed or minimally processed complex starch and increased protein intake (animal or plant source), fruits, and vegetables.  - Patient is advised to stick to a routine mealtimes to eat 3 meals  a day and avoid unnecessary snacks ( to snack only to correct hypoglycemia).  - The patient will be scheduled with Norm Salt, RDN, CDE for individualized DM education.  - I have approached patient with the following individualized plan to manage diabetes and patient agrees:   - She says she is waiting for a matching meter from her supplier [employer], she has a strips but does not know the brand. -She agrees to call back  or drop off the streets and we will either give her samples meter or prescribed a meter to her local pharmacy. In the meantime I have provided her with a FreeStyle meter and sample strips to start with.  I  will proceed with basal/bolus insulin.  -Since she is concerned about overnight hypoglycemia, lowered her basal insulin  Tresiba to 20  units QHS, and prandial insulin  Humalog to 5 units TIDAC for pre-meal BG readings of 90-150mg /dl, plus patient specific correction dose for unexpected hyperglycemia above  150mg /dl, associated with strict monitoring of glucose  AC and HS. - Patient is warned not to take insulin without proper monitoring per orders. -Adjustment parameters are given for hypo and hyperglycemia in writing. -Patient is encouraged to call clinic for blood glucose levels less than 70 or above 300 mg /dl. -Insulin is exclusive choice of therapy she has to treat diabetes. -I gave her a brochure and information on continues glucose monitoring program, [employer].  - Patient specific target  A1c;  LDL, HDL, Triglycerides, and  Waist Circumference were discussed in detail.  2) BP/HTN: Controlled. she is not on ACEI therapy.  3) Lipids/HPL:  From February 2017 and her LDL was elevated at 113, she is not on statins. She will be considered on subsequent visits.  4)  Weight/Diet: She does not have weight problem, her BMI is 22. CDE Consult will be initiated , exercise, and detailed carbohydrates information provided.  5. Hypothyroidism following radioiodine therapy. -This is a settled diagnosis for her since 82. - I had a long discussion with her regarding choice of therapy for hypothyroidism. She agrees to switch to Synthroid from [facility] Thyroid. I will initiate Synthroid 88 g by mouth every morning.  - We discussed about correct intake of levothyroxine, at fasting, with water, separated by at least 30 minutes from breakfast, and separated by more than 4 hours from calcium, iron, multivitamins,  acid reflux medications (PPIs). -Patient is made aware of the fact that thyroid hormone replacement is needed for life, dose to be adjusted by periodic monitoring of thyroid function tests.  6) Chronic Care/Health Maintenance:  -Patient is encouraged to continue to follow up with Ophthalmology, Podiatrist at least yearly or according to recommendations, and advised to  stay away from smoking. I have recommended yearly flu vaccine and pneumonia vaccination at least every 5 years; moderate intensity exercise for up to 150 minutes weekly; and  sleep for at least 7 hours a day.  - 30 minutes of time was spent on the care of this patient , 50% of which was applied for counseling on diabetes complications and their preventions.  - Patient to bring meter and  blood glucose logs during their next visit.   - I advised patient to maintain close follow up with WHITE, MARSHA L, NP for primary care needs.  Follow up plan: - Return in about 3 months (around 12/21/2015) for diabetes, underactive thyroid, follow up with pre-visit labs, meter, and logs.  Marquis Lunch, MD Phone: [phone]  Fax: [phone]   09/20/2015, 2:43 PM

## 2015-09-21 ENCOUNTER — Ambulatory Visit: Payer: Managed Care, Other (non HMO) | Admitting: Nutrition

## 2015-10-03 ENCOUNTER — Encounter: Payer: Self-pay | Admitting: Nutrition

## 2015-10-03 ENCOUNTER — Telehealth: Payer: Self-pay | Admitting: Nutrition

## 2015-10-03 ENCOUNTER — Encounter: Payer: Managed Care, Other (non HMO) | Attending: "Endocrinology | Admitting: Nutrition

## 2015-10-03 ENCOUNTER — Encounter: Payer: Self-pay | Admitting: "Endocrinology

## 2015-10-03 VITALS — Ht 59.0 in | Wt 108.0 lb

## 2015-10-03 DIAGNOSIS — Z713 Dietary counseling and surveillance: Secondary | ICD-10-CM | POA: Diagnosis not present

## 2015-10-03 DIAGNOSIS — IMO0002 Reserved for concepts with insufficient information to code with codable children: Secondary | ICD-10-CM

## 2015-10-03 DIAGNOSIS — E1065 Type 1 diabetes mellitus with hyperglycemia: Secondary | ICD-10-CM | POA: Insufficient documentation

## 2015-10-03 DIAGNOSIS — E108 Type 1 diabetes mellitus with unspecified complications: Secondary | ICD-10-CM

## 2015-10-03 NOTE — Telephone Encounter (Signed)
TC to her to remind her she is only taking Humalog with meals and not after supper even though she is testing at that time. Encouraged to test 4 times per day per Dr. Isidoro DonningNIda's instructions but only give Humalog with meals based on premeal blood sugar reading 3 times per day.. Encouraged her to take Tresiba 20 units at night before bed as prescribed. She verbalized understanding.

## 2015-10-03 NOTE — Progress Notes (Signed)
  Medical Nutrition Therapy:  Appt start time: 0800 end time:  0900.  Assessment:  Primary concerns today: Diabetes Type 1. Dx 2 years ago. May be LADA.  A1C 6.1% but her A1C  had been >10%. She lives by herself.  She notes her BS are running 200-500 mg/dl and then has lows in the 40-50's often. Not been able to test due to issues with getting strips from her insurance provider. Complains of chronic fatigue, thirst, frothy and strong odor urine and weight loss. Not testing blood sugars. Eats peanuts or PB when her BS is low. Takes 20 units of Tresiba daily and suppose to take 8 units plus sliding scale of Humalog with meals. She notes her FBS are  >200 mg/dl. She has not met with an RDN, or CDE before.  Eats three meals per day but is very very picky. Doesn't eat a lot of foods. Has a glucagon pen for emergencies. Doesn't have a diabetes alert bracelet or necklace and was encouraged to get one. She is at high risk for DKA and other complications from DM. Diet is insuffient to meet her nutritional needs and control her DM. She was encouraged to get a meter from [employer] and testing supplies and test 4 times per day and bring meter an BS readings with her at all visits.   Lab Results  Component Value Date   HGBA1C 6.1 08/29/2015    Preferred Learning Style:   No preference indicated   Learning Readiness:   Ready  Change in progress   MEDICATIONS: See list   DIETARY INTAKE:   24-hr recall:  B ( AM): 1 slice of bacon and 1 slice toast, 6-8 oz juice  Snk ( AM): none  L ( PM): Ham sandwich and sun chips, unsweet tea Snk ( PM):  D ( PM): Grilled shrimp, salad, 1 slice bread and unsweet tea. Snk ( PM): none Beverages: Unsweet tea  Usual physical activity: ADL  Estimated energy needs: 1600 calories 180 g carbohydrates 120 g protein 44 g fat  Progress Towards Goal(s):  In progress.   Nutritional Diagnosis:  NB-1.1 Food and nutrition-related knowledge deficit As related to Diabetes  Type 1.  As evidenced by A1C > 10%.    Intervention:  Nutrition and Diabetes education provided on My Plate, CHO counting, meal planning, portion sizes, timing of meals, avoiding snacks between meals unless having a low blood sugar, target ranges for A1C and blood sugars, signs/symptoms and treatment of hyper/hypoglycemia, monitoring blood sugars, taking medications as prescribed, benefits of exercising 30 minutes per day and prevention of complications of DM.   Goals Follow My Plate Monitor blood sugars before meals and at bedtime Eat 30-45 grams of carbs per meals. Get [employer] meter and testing strips to test 4 times per day. Prevent low blood sugars   Teaching Method Utilized:  Visual Auditory Hands on  Handouts given during visit include:  Diabetes Plate Method   Meal Plan Card  Diabetes Living Well Booklet  Barriers to learning/adherence to lifestyle change: None  Demonstrated degree of understanding via:  Teach Back   Monitoring/Evaluation:  Dietary intake, exercise, meal planning, SBG, and body weight in 1 month(s).

## 2015-10-03 NOTE — Patient Instructions (Signed)
Goals Follow My Plate Monitor blood sugars before meals and at bedtime Eat 30-45 grams of carbs per meals. Get Relion meter and testing strips to test 4 times per day. Prevent low blood sugars

## 2015-11-07 ENCOUNTER — Ambulatory Visit: Payer: Managed Care, Other (non HMO) | Admitting: Nutrition

## 2015-12-01 ENCOUNTER — Telehealth: Payer: Self-pay | Admitting: "Endocrinology

## 2015-12-01 NOTE — Telephone Encounter (Signed)
i returned pts call and requested for BG readings for 3 days  wednesday: 180, 409, 535, 301 Thursday : 201, 450, 343, 348 friday : 303                           139 pt complains that she has been running high due to respiratory infection.  I suggested for her to increase tresiba to 25 units qhs, increase Humalog to 10 units tidac plus correction, and call back if she has BG >300 x 3.

## 2015-12-05 DIAGNOSIS — Z0271 Encounter for disability determination: Secondary | ICD-10-CM

## 2015-12-06 IMAGING — CR DG KNEE 1-2V PORT*R*
1 series · 1 of 1 positions shown · non-contrast
Comparison: 06/29/2014, 07/07/2014 and 07/18/2014

CLINICAL DATA: Tibial plateau fracture postop.

EXAM:
PORTABLE RIGHT KNEE - 1-2 VIEW

[AP]
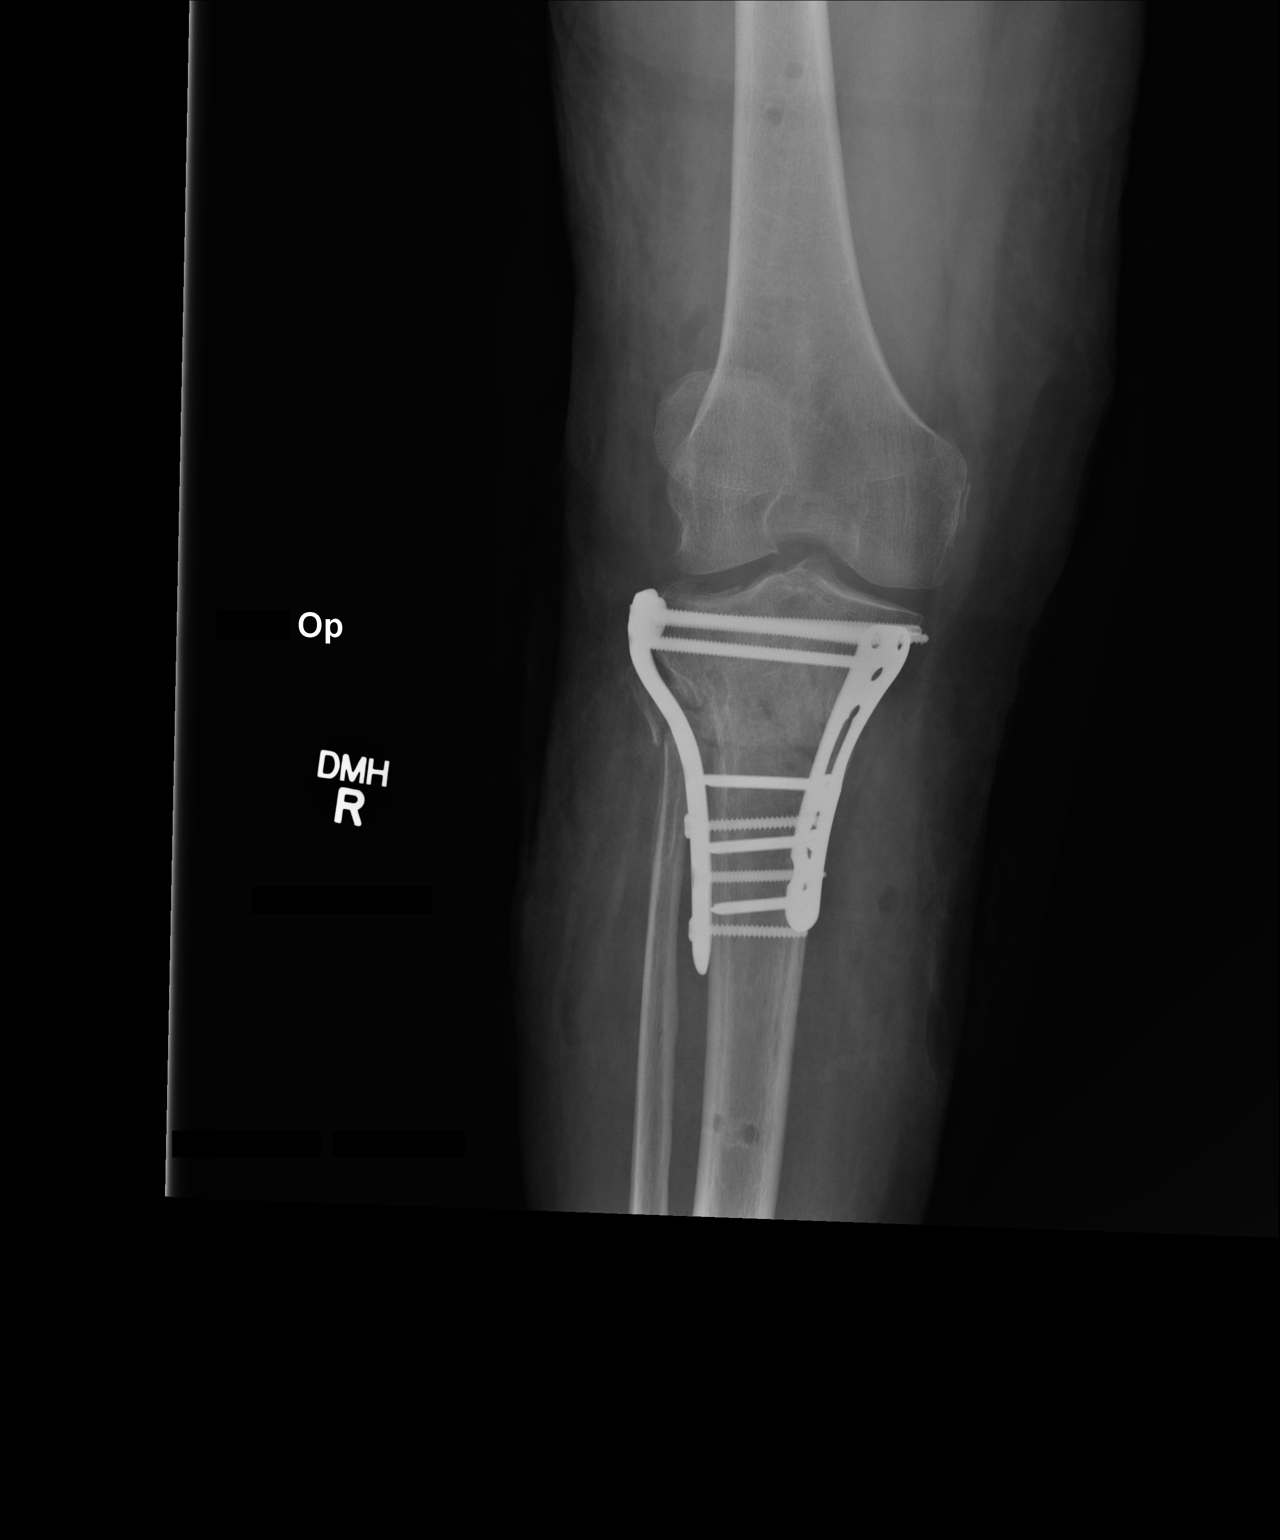

[1 of 1 positions shown; findings below may reference images not displayed]

FINDINGS: Exam demonstrates medial lateral fixation plate and screws fixating
patient's proximal tibial fracture with hardware intact and anatomic
alignment about the fracture site and mild interval healing. There
is evidence of patient's proximal fibular fracture with near
anatomic alignment about the fracture site. There is also minimal
irregularity over the medial femoral condyle compatible known
fracture in this location. Mild degenerative changes are present.
IMPRESSION: Fixation hardware intact over patient's proximal tibial fracture
with near anatomic alignment about the fracture site and mild
interval healing.

Known proximal fibular fracture and distal medial femoral condylar
fractures.

## 2015-12-21 ENCOUNTER — Ambulatory Visit: Payer: Managed Care, Other (non HMO) | Admitting: "Endocrinology

## 2016-02-23 ENCOUNTER — Other Ambulatory Visit: Payer: Self-pay | Admitting: "Endocrinology

## 2016-05-03 IMAGING — CT CT KNEE*R* W/O CM
3 series · 8 of 14 positions shown, 9 images · non-contrast
Comparison: 07/07/2014

CLINICAL DATA: Posterior tibial plateau fracture. Evaluate for
nonunion.

EXAM:
CT OF THE RIGHT KNEE WITHOUT CONTRAST
TECHNIQUE: Multidetector CT imaging of the RIGHT knee was performed according
to the standard protocol. Multiplanar CT image reconstructions were
also generated.

[Series 5: knee soft · axial · 0.29mm/px · z∈[-132,+40]mm · 4 of 112 slices shown, 5 images]
[im 23/112  soft-tissue]
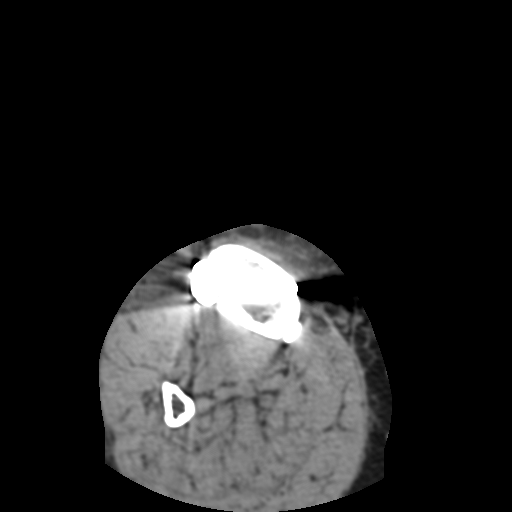
[im 23/112  bone]
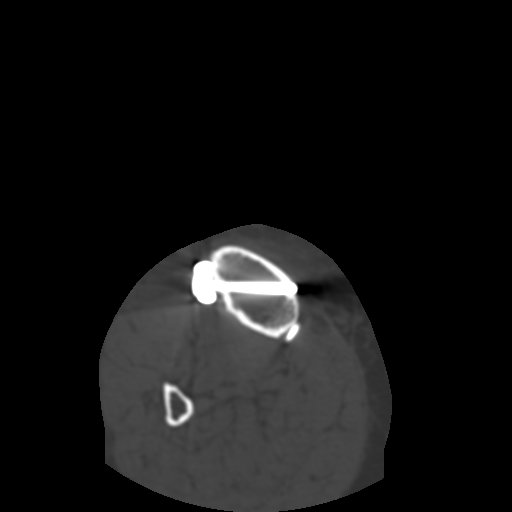
[im 45/112  bone]
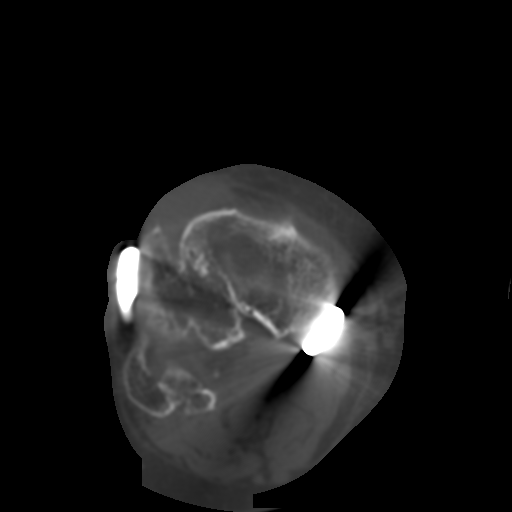
[im 67/112  bone]
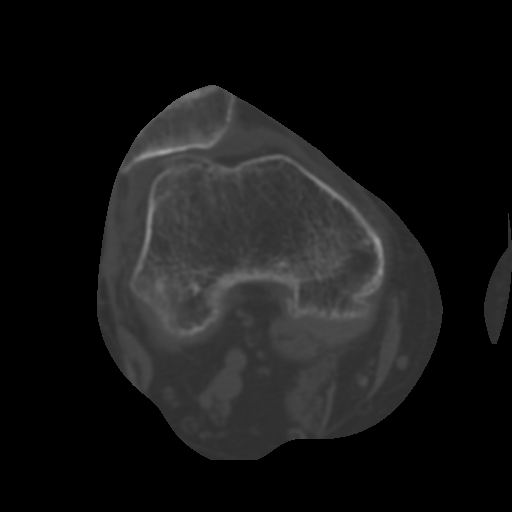
[im 89/112  bone]
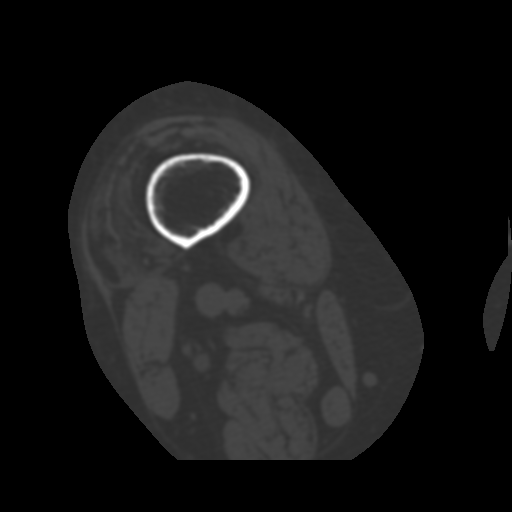

[Series 300: sag soft · sagittal · 0.57mm/px · 2 of 65 slices shown]
[im 22/65  soft-tissue]
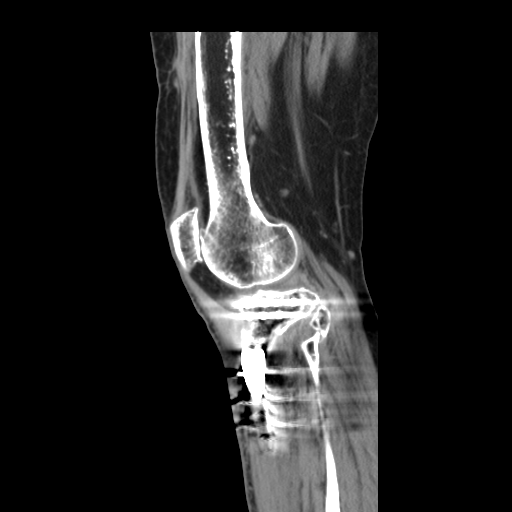
[im 43/65  soft-tissue]
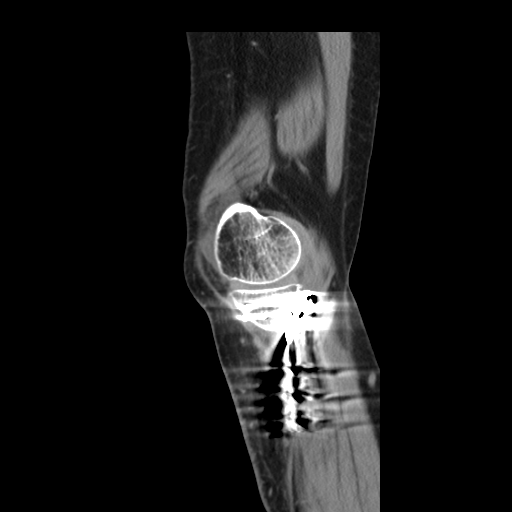

[Series 301: cor soft · coronal · 0.57mm/px · 2 of 67 slices shown]
[im 23/67  soft-tissue]
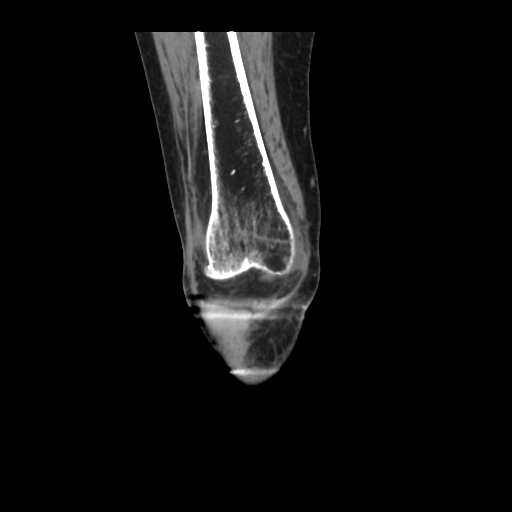
[im 45/67  soft-tissue]
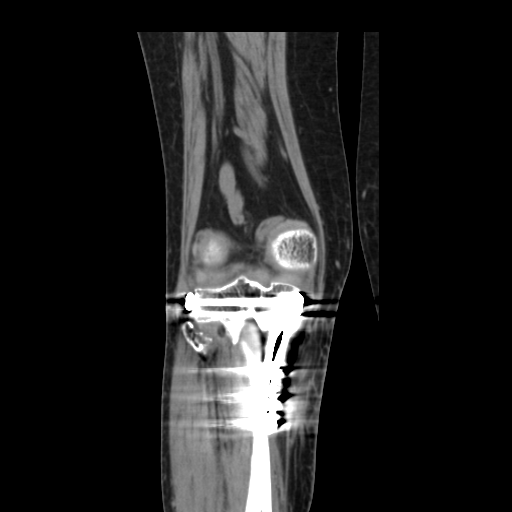

[8 of 14 positions shown; findings below may reference images not displayed]

FINDINGS: There is severe osteopenia. There is lateral tibial plateau fracture
transfixed with a lateral sideplate and multiple interlocking
screws. There is no hardware failure or complication. There is
osseous union of the fracture. There is articular surface
irregularity of the lateral tibial plateau with 4.6 mm of depression
of the lateral tibial plateau. There is moderate osteoarthritis of
the lateral femorotibial compartment. There is mild osteoarthritis
of the medial femorotibial compartment. There is severe
osteoarthritis of the lateral patellofemoral compartment. There is a
small knee joint effusion.

Interval healing of the medial femoral condyle fracture.

There is an ununited proximal fibular metaphysis fracture.

There is no other fracture or dislocation. There is no aggressive
lytic or sclerotic osseous lesion. The muscles are normal. There is
no focal fluid collection or hematoma.
IMPRESSION: 1. Comminuted lateral tibial plateau fracture transfixed with a
lateral sideplate and multiple interlocking screws with osseous
union across the fracture clefts. Persistent 4.6 mm of depression of
the lateral tibial plateau articular surface.
2. Primarily ununited proximal fibular metaphysis fracture.
3. Tricompartmental osteoarthritis of the right knee.
4. Interval healing of the medial femoral condyle fracture.

## 2016-05-27 ENCOUNTER — Encounter (HOSPITAL_COMMUNITY): Payer: Self-pay

## 2016-05-27 ENCOUNTER — Emergency Department (HOSPITAL_COMMUNITY)
Admission: EM | Admit: 2016-05-27 | Discharge: 2016-05-27 | Disposition: A | Discharge status: Home / Self Care | Payer: BLUE CROSS/BLUE SHIELD | Attending: Emergency Medicine | Admitting: Emergency Medicine

## 2016-05-27 DIAGNOSIS — Z87891 Personal history of nicotine dependence: Secondary | ICD-10-CM | POA: Insufficient documentation

## 2016-05-27 DIAGNOSIS — Z79899 Other long term (current) drug therapy: Secondary | ICD-10-CM | POA: Insufficient documentation

## 2016-05-27 DIAGNOSIS — E1065 Type 1 diabetes mellitus with hyperglycemia: Secondary | ICD-10-CM | POA: Insufficient documentation

## 2016-05-27 DIAGNOSIS — E039 Hypothyroidism, unspecified: Secondary | ICD-10-CM | POA: Insufficient documentation

## 2016-05-27 DIAGNOSIS — R739 Hyperglycemia, unspecified: Secondary | ICD-10-CM

## 2016-05-27 LAB — CBG MONITORING, ED
GLUCOSE-CAPILLARY: 277 mg/dL — AB (ref 65–99)
Glucose-Capillary: 525 mg/dL (ref 65–99)

## 2016-05-27 LAB — URINALYSIS, ROUTINE W REFLEX MICROSCOPIC
BACTERIA UA: NONE SEEN
Bilirubin Urine: NEGATIVE
Glucose, UA: >=500 mg/dL — AB
KETONES UR: NEGATIVE mg/dL
NITRITE: NEGATIVE
PROTEIN: NEGATIVE mg/dL
Specific Gravity, Urine: 1.036 — ABNORMAL HIGH (ref 1.005–1.030)
pH: 6 (ref 5.0–8.0)

## 2016-05-27 LAB — CBC
HEMATOCRIT: 45.7 % (ref 36.0–46.0)
Hemoglobin: 15.6 g/dL — ABNORMAL HIGH (ref 12.0–15.0)
MCH: 32.2 pg (ref 26.0–34.0)
MCHC: 34.1 g/dL (ref 30.0–36.0)
MCV: 94.2 fL (ref 78.0–100.0)
Platelets: 185 10*3/uL (ref 150–400)
RBC: 4.85 MIL/uL (ref 3.87–5.11)
RDW: 13 % (ref 11.5–15.5)
WBC: 8.8 10*3/uL (ref 4.0–10.5)

## 2016-05-27 LAB — BASIC METABOLIC PANEL
Anion gap: 9 (ref 5–15)
BUN: 17 mg/dL (ref 6–20)
CO2: 27 mmol/L (ref 22–32)
CREATININE: 0.52 mg/dL (ref 0.44–1.00)
Calcium: 10.2 mg/dL (ref 8.9–10.3)
Chloride: 99 mmol/L — ABNORMAL LOW (ref 101–111)
GFR calc Af Amer: >60 mL/min (ref >60)
GFR calc non Af Amer: >60 mL/min (ref >60)
Glucose, Bld: 356 mg/dL — ABNORMAL HIGH (ref 65–99)
Potassium: 3.3 mmol/L — ABNORMAL LOW (ref 3.5–5.1)
SODIUM: 135 mmol/L (ref 135–145)

## 2016-05-27 NOTE — ED Triage Notes (Signed)
Patient states that her blood sugar has been running high since they changed her from Humalog to the generic form.  Today it was over 600 at home.  States that her vision gets blurry, she becomes stuffy, and it is hard to breathe when her sugar goes up.

## 2016-05-27 NOTE — ED Notes (Addendum)
Went into patients room to give discharge papers and pt did not stay to receive discharge papers or sign.

## 2016-05-27 NOTE — Discharge Instructions (Signed)
Take your usual prescriptions as previously directed.  Call your regular medical doctor tomorrow to schedule a follow up appointment within the next 2 days.  Return to the Emergency Department immediately sooner if worsening.  ° °

## 2016-05-27 NOTE — ED Provider Notes (Signed)
[facility]-EM[facility] Provider Note   CSN: [id number] Arrival date & time: 05/27/16  1903     History   Chief Complaint Chief Complaint  Patient presents with  . Hyperglycemia    HPI Autumn Daniels is a 56 y.o. female.  HPI  Pt was seen at 2110. Per pt, c/o gradual onset and persistence of multiple intermittent episodes of "high blood sugars" for the past several weeks. Pt states her CBG's have been "running high" ever since they changed her Humalog to generic. Pt states her CBG at 1730 today was "in the 600's." Pt states she gave herself insulin SQ and came to the ED for evaluation. States she "knows when her sugar is high" because her "vision gets blurry," her "nose gets stuffy" and "it gets hard to breathe." States her symptoms have gradually improved since arrival to the ED.  Denies CP/palpitations, no cough, no abd pain, no N/V/D, no back pain, no focal motor weakness.   Past Medical History:  Diagnosis Date  . Anemia   . Anxiety   . Depression   . Diabetes mellitus without complication (HCC)    type 1  . Diarrhea    as of 12/2014 - pt states that she's had diarrhea for a year  . Fibromyalgia   . Frequent UTI    due to small urethra  . GERD (gastroesophageal reflux disease)   . Headache    hx of migraines  . History of hiatal hernia   . Lupus   . Pneumonia   . Stevens-Johnson syndrome (HCC)   . Thyroid disease    has had radioactive iodine treatment for hyperthyroidism in 1990  . Vitamin D deficiency 07/07/2014    Patient Active Problem List   Diagnosis Date Noted  . Acute blood loss anemia 07/20/2014  . Vitamin D deficiency 07/07/2014  . Hypothyroidism following radioiodine therapy   . Type 1 diabetes mellitus, uncontrolled (HCC)   . GERD (gastroesophageal reflux disease)   . Lupus   . Tibial plateau fracture 06/29/2014  . Closed fracture of thoracic vertebral body (HCC) 09/27/2013    Past Surgical History:  Procedure Laterality Date  . CHOLECYSTECTOMY     2  bifurcations of liver during lap chole  . EXTERNAL FIXATION LEG Right 06/30/2014   Procedure: EXTERNAL FIXATION LEG;  Surgeon: Vickki HearingStanley E Harrison, MD;  Location: [facility];  Service: Orthopedics;  Laterality: Right;  synthes large fragment exteranal fixation  c arm   . EXTERNAL FIXATION LEG Right 07/07/2014   Procedure: REVISION EXTERNAL FIXATION LEG;  Surgeon: Myrene GalasMichael Handy, MD;  [facility];  Service: Orthopedics;  Laterality: Right;  . EXTERNAL FIXATION REMOVAL Right 07/18/2014   Procedure: REMOVAL EXTERNAL FIXATION LEG;  Surgeon: Myrene GalasMichael Handy, MD;  [facility];  Service: Orthopedics;  Laterality: Right;  . HARDWARE REMOVAL Right 01/09/2015   Procedure: HARDWARE REMOVAL,RIGHT PROXIMAL TIBIAL;  Surgeon: Myrene GalasMichael Handy, MD;  [facility];  Service: Orthopedics;  Laterality: Right;  . KNEE ARTHROSCOPY Right 01/09/2015   Procedure: ARTHROSCOPY KNEE RIGHT ;  Surgeon: Myrene GalasMichael Handy, MD;  [facility];  Service: Orthopedics;  Laterality: Right;  . LIVER SURGERY     x4 due to bifurcations from lap chole  . ORIF TIBIA PLATEAU Right 07/18/2014   Procedure: RIGHT OPEN REDUCTION INTERNAL FIXATION (ORIF) TIBIA FRACTURE;  Surgeon: Myrene GalasMichael Handy, MD;  [facility];  Service: Orthopedics;  Laterality: Right;  ORIF of bicondylar plateau fracture  . STERIOD INJECTION Bilateral 01/09/2015   Procedure: RIGHT RING  FINGER AND LEFT RING FINGER STEROID INJECTION;  Surgeon: Myrene Galas, MD;  Location: [facility] OR;  Service: Orthopedics;  Laterality: Bilateral;  . TONSILLECTOMY      OB History    No data available       Home Medications    Prior to Admission medications   Medication Sig Start Date End Date Taking? Authorizing Provider  gabapentin (NEURONTIN) 300 MG capsule Take 1 capsule (300 mg total) by mouth 3 (three) times daily. 07/20/14  Yes Montez Morita, PA-C  Insulin Degludec (TRESIBA FLEXTOUCH) 200 UNIT/ML SOPN Inject 20 Units into the skin at bedtime.   Yes Historical Provider, MD  insulin  lispro (HUMALOG KWIKPEN) 100 UNIT/ML KiwkPen Inject 10 Units into the skin 3 (three) times daily with meals. Increase amount as directed per sliding scale instructions 06/13/15  Yes Historical Provider, MD  nicotine polacrilex (NICORETTE) 4 MG gum Take 4 mg by mouth as needed for smoking cessation.   Yes Historical Provider, MD  rOPINIRole (REQUIP) 1 MG tablet Take 1 mg by mouth at bedtime.  04/18/16  Yes Historical Provider, MD  SYNTHROID 88 MCG tablet TAKE 1 TABLET BY MOUTH DAILY BEFORE BREAKFAST 02/25/16  Yes Roma Kayser, MD  traMADol (ULTRAM) 50 MG tablet Take 100 mg by mouth every 8 (eight) hours as needed for moderate pain.    Yes Historical Provider, MD  Vitamin D, Ergocalciferol, (DRISDOL) 50000 UNITS CAPS capsule Take 1 capsule (50,000 Units total) by mouth every 7 (seven) days. 07/20/14  Yes Montez Morita, PA-C    Family History Family History  Problem Relation Age of Onset  . Colon cancer Mother   . Lung disease Father     Social History Social History  Substance Use Topics  . Smoking status: Former Smoker    Packs/day: 0.50    Years: 30.00    Types: Cigarettes    Quit date: 05/29/2014  . Smokeless tobacco: Never Used  . Alcohol use No     Allergies   Penicillins; Sulfa antibiotics; and Metformin and related   Review of Systems Review of Systems ROS: Statement: All systems negative except as marked or noted in the HPI; Constitutional: Negative for fever and chills. ; ; Eyes: Negative for eye pain, redness and discharge. ; ; ENMT: Negative for ear pain, hoarseness, nasal congestion, sinus pressure and sore throat. ; ; Cardiovascular: Negative for chest pain, palpitations, diaphoresis, dyspnea and peripheral edema. ; ; Respiratory: Negative for cough, wheezing and stridor. ; ; Gastrointestinal: Negative for nausea, vomiting, diarrhea, abdominal pain, blood in stool, hematemesis, jaundice and rectal bleeding. . ; ; Genitourinary: Negative for dysuria, flank pain and  hematuria. ; ; Musculoskeletal: Negative for back pain and neck pain. Negative for swelling and trauma.; ; Skin: Negative for pruritus, rash, abrasions, blisters, bruising and skin lesion.; ; Neuro: Negative for headache, lightheadedness and neck stiffness. Negative for weakness, altered level of consciousness, altered mental status, extremity weakness, paresthesias, involuntary movement, seizure and syncope.       Physical Exam Updated Vital Signs BP 116/88 (BP Location: Left Arm)   Pulse 78   Temp 98.4 F (36.9 C) (Oral)   Resp 18   Ht 4\' 11"  (1.499 m)   Wt 104 lb (47.2 kg)   SpO2 96%   BMI 21.01 kg/m   Physical Exam 2115: Physical examination:  Nursing notes reviewed; Vital signs and O2 SAT reviewed;  Constitutional: Well developed, Well nourished, Well hydrated, In no acute distress; Head:  Normocephalic, atraumatic; Eyes: EOMI, PERRL,  No scleral icterus; ENMT: Mouth and pharynx normal, Mucous membranes moist; Neck: Supple, Full range of motion, No lymphadenopathy; Cardiovascular: Regular rate and rhythm, No gallop; Respiratory: Breath sounds clear & equal bilaterally, No wheezes.  Speaking full sentences with ease, Normal respiratory effort/excursion; Chest: Nontender, Movement normal; Abdomen: Soft, Nontender, Nondistended, Normal bowel sounds; Genitourinary: No CVA tenderness; Extremities: Pulses normal, No tenderness, No edema, No calf edema or asymmetry.; Neuro: AA&Ox3, Major CN grossly intact.  Speech clear. No gross focal motor or sensory deficits in extremities. Climbs on and off stretcher easily by herself. Gait steady.; Skin: Color normal, Warm, Dry.   ED Treatments / Results  Labs (all labs ordered are listed, but only abnormal results are displayed)   EKG  EKG Interpretation None       Radiology   Procedures Procedures (including critical care time)  Medications Ordered in ED Medications - No data to display   Initial Impression / Assessment and Plan / ED  Course  I have reviewed the triage vital signs and the nursing notes.  Pertinent labs & imaging results that were available during my care of the patient were reviewed by me and considered in my medical decision making (see chart for details).  MDM Reviewed: previous chart, nursing note and vitals Reviewed previous: labs Interpretation: labs   Results for orders placed or performed during the hospital encounter of 05/27/16  Basic metabolic panel  Result Value Ref Range   Sodium 135 135 - 145 mmol/L   Potassium 3.3 (L) 3.5 - 5.1 mmol/L   Chloride 99 (L) 101 - 111 mmol/L   CO2 27 22 - 32 mmol/L   Glucose, Bld 356 (H) 65 - 99 mg/dL   BUN 17 6 - 20 mg/dL   Creatinine, Ser 1.61 0.44 - 1.00 mg/dL   Calcium 09.6 8.9 - 04.5 mg/dL   GFR calc non Af Amer >60 >60 mL/min   GFR calc Af Amer >60 >60 mL/min   Anion gap 9 5 - 15  CBC  Result Value Ref Range   WBC 8.8 4.0 - 10.5 K/uL   RBC 4.85 3.87 - 5.11 MIL/uL   Hemoglobin 15.6 (H) 12.0 - 15.0 g/dL   HCT 40.9 81.1 - 91.4 %   MCV 94.2 78.0 - 100.0 fL   MCH 32.2 26.0 - 34.0 pg   MCHC 34.1 30.0 - 36.0 g/dL   RDW 78.2 95.6 - 21.3 %   Platelets 185 150 - 400 K/uL  Urinalysis, Routine w reflex microscopic  Result Value Ref Range   Color, Urine STRAW (A) YELLOW   APPearance CLEAR CLEAR   Specific Gravity, Urine 1.036 (H) 1.005 - 1.030   pH 6.0 5.0 - 8.0   Glucose, UA >=500 (A) NEGATIVE mg/dL   Hgb urine dipstick SMALL (A) NEGATIVE   Bilirubin Urine NEGATIVE NEGATIVE   Ketones, ur NEGATIVE NEGATIVE mg/dL   Protein, ur NEGATIVE NEGATIVE mg/dL   Nitrite NEGATIVE NEGATIVE   Leukocytes, UA TRACE (A) NEGATIVE   RBC / HPF 0-5 0 - 5 RBC/hpf   WBC, UA 0-5 0 - 5 WBC/hpf   Bacteria, UA NONE SEEN NONE SEEN  CBG monitoring, ED  Result Value Ref Range   Glucose-Capillary 525 (HH) 65 - 99 mg/dL  CBG monitoring, ED  Result Value Ref Range   Glucose-Capillary 277 (H) 65 - 99 mg/dL   Comment 1 Notify RN    Comment 2 Document in Chart     2225:   Pt's CBG gradually trending downward.  Not acidotic with normal AG. Pt tol PO fluids while in the ED without N/V. Pt left before d/c papers completed.    Final Clinical Impressions(s) / ED Diagnoses   Final diagnoses:  Hyperglycemia without ketosis    New Prescriptions New Prescriptions   No medications on file     Samuel Jester, DO 05/30/16 1306

## 2016-06-09 ENCOUNTER — Other Ambulatory Visit: Payer: Self-pay | Admitting: "Endocrinology

## 2016-10-22 DIAGNOSIS — R6889 Other general symptoms and signs: Secondary | ICD-10-CM

## 2016-10-22 DIAGNOSIS — R413 Other amnesia: Secondary | ICD-10-CM

## 2016-10-22 HISTORY — DX: Other general symptoms and signs: R68.89

## 2016-10-22 HISTORY — DX: Other amnesia: R41.3

## 2017-07-04 ENCOUNTER — Other Ambulatory Visit: Payer: Self-pay

## 2017-07-04 ENCOUNTER — Encounter (HOSPITAL_COMMUNITY): Payer: Self-pay | Admitting: Emergency Medicine

## 2017-07-04 ENCOUNTER — Emergency Department (HOSPITAL_COMMUNITY): Payer: BLUE CROSS/BLUE SHIELD

## 2017-07-04 ENCOUNTER — Emergency Department (HOSPITAL_COMMUNITY)
Admission: EM | Admit: 2017-07-04 | Discharge: 2017-07-04 | Disposition: A | Discharge status: Home / Self Care | Payer: BLUE CROSS/BLUE SHIELD | Attending: Emergency Medicine | Admitting: Emergency Medicine

## 2017-07-04 DIAGNOSIS — R739 Hyperglycemia, unspecified: Secondary | ICD-10-CM

## 2017-07-04 DIAGNOSIS — Z79899 Other long term (current) drug therapy: Secondary | ICD-10-CM | POA: Diagnosis not present

## 2017-07-04 DIAGNOSIS — Z794 Long term (current) use of insulin: Secondary | ICD-10-CM | POA: Diagnosis not present

## 2017-07-04 DIAGNOSIS — R410 Disorientation, unspecified: Secondary | ICD-10-CM | POA: Diagnosis not present

## 2017-07-04 DIAGNOSIS — Z87891 Personal history of nicotine dependence: Secondary | ICD-10-CM | POA: Insufficient documentation

## 2017-07-04 DIAGNOSIS — E1065 Type 1 diabetes mellitus with hyperglycemia: Secondary | ICD-10-CM | POA: Insufficient documentation

## 2017-07-04 HISTORY — DX: Other amnesia: R41.3

## 2017-07-04 HISTORY — DX: Patient's noncompliance with other medical treatment and regimen due to unspecified reason: Z91.199

## 2017-07-04 HISTORY — DX: Other general symptoms and signs: R68.89

## 2017-07-04 HISTORY — DX: Patient's noncompliance with other medical treatment and regimen: Z91.19

## 2017-07-04 LAB — URINALYSIS, ROUTINE W REFLEX MICROSCOPIC
Bacteria, UA: NONE SEEN
Bilirubin Urine: NEGATIVE
Hgb urine dipstick: NEGATIVE
KETONES UR: 20 mg/dL — AB
Nitrite: NEGATIVE
PH: 5 (ref 5.0–8.0)
Protein, ur: NEGATIVE mg/dL
Specific Gravity, Urine: 1.012 (ref 1.005–1.030)

## 2017-07-04 LAB — COMPREHENSIVE METABOLIC PANEL
ALT: 16 U/L (ref 14–54)
AST: 15 U/L (ref 15–41)
Albumin: 3.7 g/dL (ref 3.5–5.0)
Alkaline Phosphatase: 107 U/L (ref 38–126)
Anion gap: 8 (ref 5–15)
BILIRUBIN TOTAL: 0.4 mg/dL (ref 0.3–1.2)
BUN: 11 mg/dL (ref 6–20)
CALCIUM: 9.3 mg/dL (ref 8.9–10.3)
CO2: 23 mmol/L (ref 22–32)
CREATININE: 0.47 mg/dL (ref 0.44–1.00)
Chloride: 109 mmol/L (ref 101–111)
GFR calc Af Amer: >60 mL/min (ref >60)
Glucose, Bld: 185 mg/dL — ABNORMAL HIGH (ref 65–99)
POTASSIUM: 3.5 mmol/L (ref 3.5–5.1)
Sodium: 140 mmol/L (ref 135–145)
TOTAL PROTEIN: 6.4 g/dL — AB (ref 6.5–8.1)

## 2017-07-04 LAB — TROPONIN I

## 2017-07-04 LAB — CBC WITH DIFFERENTIAL/PLATELET
BASOS PCT: 0 %
Basophils Absolute: 0 10*3/uL (ref 0.0–0.1)
Eosinophils Absolute: 0.1 10*3/uL (ref 0.0–0.7)
Eosinophils Relative: 1 %
HCT: 41.6 % (ref 36.0–46.0)
Hemoglobin: 13.3 g/dL (ref 12.0–15.0)
LYMPHS ABS: 2.4 10*3/uL (ref 0.7–4.0)
Lymphocytes Relative: 39 %
MCH: 29.9 pg (ref 26.0–34.0)
MCHC: 32 g/dL (ref 30.0–36.0)
MCV: 93.5 fL (ref 78.0–100.0)
MONO ABS: 0.5 10*3/uL (ref 0.1–1.0)
Monocytes Relative: 8 %
NEUTROS ABS: 3.2 10*3/uL (ref 1.7–7.7)
Neutrophils Relative %: 52 %
PLATELETS: 173 10*3/uL (ref 150–400)
RBC: 4.45 MIL/uL (ref 3.87–5.11)
RDW: 13.3 % (ref 11.5–15.5)
WBC: 6.2 10*3/uL (ref 4.0–10.5)

## 2017-07-04 LAB — LIPASE, BLOOD: LIPASE: 22 U/L (ref 11–51)

## 2017-07-04 LAB — CBG MONITORING, ED: GLUCOSE-CAPILLARY: 185 mg/dL — AB (ref 65–99)

## 2017-07-04 LAB — LACTIC ACID, PLASMA: Lactic Acid, Venous: 1 mmol/L (ref 0.5–1.9)

## 2017-07-04 MED ORDER — SODIUM CHLORIDE 0.9 % IV BOLUS
1000.0000 mL | Freq: Once | INTRAVENOUS | Status: AC
  Administered 2017-07-04: 1000 mL via INTRAVENOUS

## 2017-07-04 NOTE — ED Provider Notes (Signed)
[facility] EMERGENCY DEPARTMENT Provider Note   CSN: [id number] Arrival date & time: 07/04/17  1352     History   Chief Complaint Chief Complaint  Patient presents with  . Hyperglycemia    DM type 1    HPI Autumn Daniels is a 57 y.o. female.  HPI Pt was seen at 1400. Per pt, c/o gradual onset and persistence of constant "high blood sugars" for "a while." Has been associated with increased urination, thirst, as well as weight loss. Pt states she was evaluated by her PMD yesterday and told to come to the ED today because her Hgb A1c was "too high to read" and her CBG was "300's." Pt states she has felt generally weak today and "confused." Endorses hx of multiple loose stools for the past 3 weeks, as well as "memory issues" for the past 8+ months. Denies CP/palpitations, no SOB/cough, no abd pain, no N/V, no fevers, no black or blood in stools, no dysuria/hematuria, no visual changes, no focal motor weakness, no tingling/numbness in extremities, no ataxia, no slurred speech, no facial droop.    Past Medical History:  Diagnosis Date  . Anemia   . Anxiety   . Depression   . Diabetes mellitus without complication (HCC)    type 1  . Diarrhea    as of 12/2014 - pt states that she's had diarrhea for a year  . Fibromyalgia   . Forgetfulness 10/2016  . Frequent UTI    due to small urethra  . GERD (gastroesophageal reflux disease)   . Headache    hx of migraines  . History of hiatal hernia   . Lupus (HCC)   . Memory difficulty 10/2016  . Noncompliance   . Pneumonia   . Stevens-Johnson syndrome (HCC)   . Thyroid disease    has had radioactive iodine treatment for hyperthyroidism in 1990  . Vitamin D deficiency 07/07/2014    Patient Active Problem List   Diagnosis Date Noted  . Acute blood loss anemia 07/20/2014  . Vitamin D deficiency 07/07/2014  . Hypothyroidism following radioiodine therapy   . Type 1 diabetes mellitus, uncontrolled (HCC)   . GERD (gastroesophageal reflux  disease)   . Lupus (HCC)   . Tibial plateau fracture 06/29/2014  . Closed fracture of thoracic vertebral body (HCC) 09/27/2013    Past Surgical History:  Procedure Laterality Date  . CHOLECYSTECTOMY     2 bifurcations of liver during lap chole  . EXTERNAL FIXATION LEG Right 06/30/2014   Procedure: EXTERNAL FIXATION LEG;  Surgeon: Vickki Hearing, MD;  Location: [facility];  Service: Orthopedics;  Laterality: Right;  synthes large fragment exteranal fixation  c arm   . EXTERNAL FIXATION LEG Right 07/07/2014   Procedure: REVISION EXTERNAL FIXATION LEG;  Surgeon: Myrene Galas, MD;  Location: [facility] OR;  Service: Orthopedics;  Laterality: Right;  . EXTERNAL FIXATION REMOVAL Right 07/18/2014   Procedure: REMOVAL EXTERNAL FIXATION LEG;  Surgeon: Myrene Galas, MD;  Location: [facility] OR;  Service: Orthopedics;  Laterality: Right;  . HARDWARE REMOVAL Right 01/09/2015   Procedure: HARDWARE REMOVAL,RIGHT PROXIMAL TIBIAL;  Surgeon: Myrene Galas, MD;  Location: [facility] OR;  Service: Orthopedics;  Laterality: Right;  . KNEE ARTHROSCOPY Right 01/09/2015   Procedure: ARTHROSCOPY KNEE RIGHT ;  Surgeon: Myrene Galas, MD;  Location: [facility] OR;  Service: Orthopedics;  Laterality: Right;  . LIVER SURGERY     x4 due to bifurcations from lap chole  . ORIF TIBIA PLATEAU Right 07/18/2014   Procedure: RIGHT OPEN  REDUCTION INTERNAL FIXATION (ORIF) TIBIA FRACTURE;  Surgeon: Myrene Galas, MD;  Location: [facility] OR;  Service: Orthopedics;  Laterality: Right;  ORIF of bicondylar plateau fracture  . STERIOD INJECTION Bilateral 01/09/2015   Procedure: RIGHT RING FINGER AND LEFT RING FINGER STEROID INJECTION;  Surgeon: Myrene Galas, MD;  Location: [facility] OR;  Service: Orthopedics;  Laterality: Bilateral;  . TONSILLECTOMY       OB History   None      Home Medications    Prior to Admission medications   Medication Sig Start Date End Date Taking? Authorizing Provider  gabapentin (NEURONTIN) 300 MG capsule Take 1 capsule (300 mg total) by  mouth 3 (three) times daily. 07/20/14   Montez Morita, PA-C  Insulin Degludec (TRESIBA FLEXTOUCH) 200 UNIT/ML SOPN Inject 20 Units into the skin at bedtime.    [provider]  insulin lispro (HUMALOG KWIKPEN) 100 UNIT/ML KiwkPen Inject 10 Units into the skin 3 (three) times daily with meals. Increase amount as directed per sliding scale instructions 06/13/15   [provider]  nicotine polacrilex (NICORETTE) 4 MG gum Take 4 mg by mouth as needed for smoking cessation.    [provider]  rOPINIRole (REQUIP) 1 MG tablet Take 1 mg by mouth at bedtime.  04/18/16   [provider]  SYNTHROID 88 MCG tablet TAKE ONE TABLET BY MOUTH DAILY BEFORE BREAKFAST 06/09/16   Roma Kayser, MD  traMADol (ULTRAM) 50 MG tablet Take 100 mg by mouth every 8 (eight) hours as needed for moderate pain.     [provider]  Vitamin D, Ergocalciferol, (DRISDOL) 50000 UNITS CAPS capsule Take 1 capsule (50,000 Units total) by mouth every 7 (seven) days. 07/20/14   Montez Morita, PA-C    Family History Family History  Problem Relation Age of Onset  . Colon cancer Mother   . Lung disease Father     Social History Social History   Tobacco Use  . Smoking status: Former Smoker    Packs/day: 0.50    Years: 30.00    Pack years: 15.00    Types: Cigarettes    Last attempt to quit: 05/29/2014    Years since quitting: 3.1  . Smokeless tobacco: Never Used  Substance Use Topics  . Alcohol use: No  . Drug use: No     Allergies   Penicillins; Sulfa antibiotics; and Metformin and related   Review of Systems Review of Systems ROS: Statement: All systems negative except as marked or noted in the HPI; Constitutional: Negative for fever and chills. +generalized weakness.; ; Eyes: Negative for eye pain, redness and discharge. ; ; ENMT: Negative for ear pain, hoarseness, nasal congestion, sinus pressure and sore throat. ; ; Cardiovascular: Negative for chest pain, palpitations,  diaphoresis, dyspnea and peripheral edema. ; ; Respiratory: Negative for cough, wheezing and stridor. ; ; Gastrointestinal: +diarrhea, increased thirst. Negative for nausea, vomiting, abdominal pain, blood in stool, hematemesis, jaundice and rectal bleeding. . ; ; Genitourinary: +increased urination. Negative for dysuria, flank pain and hematuria. ; ; Musculoskeletal: Negative for back pain and neck pain. Negative for swelling and trauma.; ; Skin: Negative for pruritus, rash, abrasions, blisters, bruising and skin lesion.; ; Neuro: +"memory issues," "confusion." Negative for headache, lightheadedness and neck stiffness. Negative for altered level of consciousness, altered mental status, extremity weakness, paresthesias, involuntary movement, seizure and syncope.       Physical Exam Updated Vital Signs BP 110/69   Pulse 89   Temp 98.3 F (36.8 C) (Oral)  Resp (!) 87   Ht 4\' 11"  (1.499 m)   Wt 44.7 kg (98 lb 8 oz)   SpO2 99%   BMI 19.89 kg/m   14:14 Orthostatic Vital Signs TH  Orthostatic Lying   BP- Lying: 113/64   Pulse- Lying: 78       Orthostatic Sitting  BP- Sitting: 100/87   Pulse- Sitting: 83       Orthostatic Standing at 0 minutes  BP- Standing at 0 minutes: 115/71   Pulse- Standing at 0 minutes: 87     Physical Exam 1405: Physical examination:  Nursing notes reviewed; Vital signs and O2 SAT reviewed;  Constitutional: Well developed, Well nourished, Well hydrated, In no acute distress; Head:  Normocephalic, atraumatic; Eyes: EOMI, PERRL, No scleral icterus; ENMT: Mouth and pharynx normal, Mucous membranes moist; Neck: Supple, Full range of motion, No lymphadenopathy; Cardiovascular: Regular rate and rhythm, No gallop; Respiratory: Breath sounds clear & equal bilaterally, No wheezes.  Speaking full sentences with ease, Normal respiratory effort/excursion; Chest: Nontender, Movement normal; Abdomen: Soft, Nontender, Nondistended, Normal bowel sounds; Genitourinary: No CVA  tenderness; Extremities: Peripheral pulses normal, No tenderness, No edema, No calf edema or asymmetry.; Neuro: AA&Ox3, Major CN grossly intact. Speech clear.  No facial droop.  No nystagmus. Grips equal. Strength 5/5 equal bilat UE's and LE's.  DTR 2/4 equal bilat UE's and LE's.  No gross sensory deficits.  Normal cerebellar testing bilat UE's (finger-nose) and LE's (heel-shin). Climbs on and off stretcher easily by herself. Gait steady.; Skin: Color normal, Warm, Dry.   ED Treatments / Results  Labs (all labs ordered are listed, but only abnormal results are displayed)   EKG EKG Interpretation  Date/Time:  Saturday July 04 2017 14:31:06 EDT Ventricular Rate:  80 PR Interval:    QRS Duration: 81 QT Interval:  366 QTC Calculation: 423 R Axis:   41 Text Interpretation:  Sinus rhythm When compared with ECG of 06/29/2014 No significant change was found Confirmed by Samuel JesterMcManus, Tametha Banning [phone]([id number]) on 07/04/2017 2:34:14 PM   Radiology   Procedures Procedures (including critical care time)  Medications Ordered in ED Medications - No data to display   Initial Impression / Assessment and Plan / ED Course  I have reviewed the triage vital signs and the nursing notes.  Pertinent labs & imaging results that were available during my care of the patient were reviewed by me and considered in my medical decision making (see chart for details).  MDM Reviewed: previous chart, nursing note and vitals Reviewed previous: labs and ECG Interpretation: labs, ECG, x-ray and CT scan   Results for orders placed or performed during the hospital encounter of 07/04/17  Urinalysis, Routine w reflex microscopic  Result Value Ref Range   Color, Urine YELLOW YELLOW   APPearance CLEAR CLEAR   Specific Gravity, Urine 1.012 1.005 - 1.030   pH 5.0 5.0 - 8.0   Glucose, UA >=500 (A) NEGATIVE mg/dL   Hgb urine dipstick NEGATIVE NEGATIVE   Bilirubin Urine NEGATIVE NEGATIVE   Ketones, ur 20 (A) NEGATIVE mg/dL    Protein, ur NEGATIVE NEGATIVE mg/dL   Nitrite NEGATIVE NEGATIVE   Leukocytes, UA SMALL (A) NEGATIVE   RBC / HPF 0-5 0 - 5 RBC/hpf   WBC, UA 6-30 0 - 5 WBC/hpf   Bacteria, UA NONE SEEN NONE SEEN   Squamous Epithelial / LPF 6-30 (A) NONE SEEN   Mucus PRESENT   Comprehensive metabolic panel  Result Value Ref Range   Sodium 140 135 -  145 mmol/L   Potassium 3.5 3.5 - 5.1 mmol/L   Chloride 109 101 - 111 mmol/L   CO2 23 22 - 32 mmol/L   Glucose, Bld 185 (H) 65 - 99 mg/dL   BUN 11 6 - 20 mg/dL   Creatinine, Ser 1.61 0.44 - 1.00 mg/dL   Calcium 9.3 8.9 - 09.6 mg/dL   Total Protein 6.4 (L) 6.5 - 8.1 g/dL   Albumin 3.7 3.5 - 5.0 g/dL   AST 15 15 - 41 U/L   ALT 16 14 - 54 U/L   Alkaline Phosphatase 107 38 - 126 U/L   Total Bilirubin 0.4 0.3 - 1.2 mg/dL   GFR calc non Af Amer >60 >60 mL/min   GFR calc Af Amer >60 >60 mL/min   Anion gap 8 5 - 15  Troponin I  Result Value Ref Range   Troponin I <0.03 <0.03 ng/mL  Lactic acid, plasma  Result Value Ref Range   Lactic Acid, Venous 1.0 0.5 - 1.9 mmol/L  CBC with Differential  Result Value Ref Range   WBC 6.2 4.0 - 10.5 K/uL   RBC 4.45 3.87 - 5.11 MIL/uL   Hemoglobin 13.3 12.0 - 15.0 g/dL   HCT 04.5 40.9 - 81.1 %   MCV 93.5 78.0 - 100.0 fL   MCH 29.9 26.0 - 34.0 pg   MCHC 32.0 30.0 - 36.0 g/dL   RDW 91.4 78.2 - 95.6 %   Platelets 173 150 - 400 K/uL   Neutrophils Relative % 52 %   Lymphocytes Relative 39 %   Monocytes Relative 8 %   Eosinophils Relative 1 %   Basophils Relative 0 %   Neutro Abs 3.2 1.7 - 7.7 K/uL   Lymphs Abs 2.4 0.7 - 4.0 K/uL   Monocytes Absolute 0.5 0.1 - 1.0 K/uL   Eosinophils Absolute 0.1 0.0 - 0.7 K/uL   Basophils Absolute 0.0 0.0 - 0.1 K/uL   WBC Morphology ATYPICAL LYMPHOCYTES   Lipase, blood  Result Value Ref Range   Lipase 22 11 - 51 U/L  CBG monitoring, ED  Result Value Ref Range   Glucose-Capillary 185 (H) 65 - 99 mg/dL   Dg Chest 2 View Result Date: 07/04/2017 CLINICAL DATA:  57 year old female  with a history of diabetes EXAM: CHEST - 2 VIEW COMPARISON:  06/29/2014 FINDINGS: Cardiomediastinal silhouette unchanged. No evidence of central vascular congestion. No pneumothorax or pleural effusion. Coarsened interstitial markings without evidence new airspace disease. No interlobular septal thickening. IMPRESSION: Chronic lung changes with no evidence of acute cardiopulmonary disease Electronically Signed   By: Gilmer Mor D.O.   On: 07/04/2017 14:54   Ct Head Wo Contrast Result Date: 07/04/2017 CLINICAL DATA:  Confusion and weakness. EXAM: CT HEAD WITHOUT CONTRAST TECHNIQUE: Contiguous axial images were obtained from the base of the skull through the vertex without intravenous contrast. COMPARISON:  Head CT dated 09/21/2013. FINDINGS: Brain: There is no mass, hemorrhage, edema or other evidence of acute parenchymal abnormality. No extra-axial hemorrhage. Vascular: No hyperdense vessel or unexpected calcification. Skull: Normal. Negative for fracture or focal lesion. Sinuses/Orbits: No acute finding. Other: None. IMPRESSION: Negative head CT.  No intracranial mass, hemorrhage or edema. Electronically Signed   By: Bary Richard M.D.   On: 07/04/2017 15:18    1530:  Udip appears contaminated, pt denies dysuria; UC is pending. Pt is not orthostatic on VS. Pt has tol PO well without N/V. No N/V or stooling while in the ED. Pt has ambulated in the  ED with steady gait, easy resps, NAD. Workup reassuring. No clear indication for admission at this time and pt wants to go home now. Dx and testing d/w pt and family.  Questions answered.  Verb understanding, agreeable to d/c home with outpt f/u.     Final Clinical Impressions(s) / ED Diagnoses   Final diagnoses:  None    ED Discharge Orders    None       Samuel Jester, DO 07/08/17 1739

## 2017-07-04 NOTE — ED Triage Notes (Signed)
Pt recently had the flu and developed bronchitis and was given antibiotics which she finished early this week. Pt has hx of late onset type 1 DM and glucose has never really been controlled, seen at PCP yesterday and a1c was too high to read and FS was in the high 300's. Pt states today she has felt confused and weak.

## 2017-07-04 NOTE — ED Notes (Signed)
Pt ambulated in hall with no issues. PO challenge with no issues as well

## 2017-07-04 NOTE — Discharge Instructions (Signed)
Take your usual prescriptions as previously directed. Continue your diabetic diet, as previously instructed.  Call your regular medical doctor and your Endocrine doctor on Monday to schedule a follow up appointment this week.  Return to the Emergency Department immediately sooner if worsening.

## 2017-07-06 LAB — URINE CULTURE

## 2017-10-14 ENCOUNTER — Ambulatory Visit: Payer: BLUE CROSS/BLUE SHIELD | Admitting: Orthopaedic Surgery

## 2017-10-14 ENCOUNTER — Encounter: Payer: Self-pay | Admitting: Orthopaedic Surgery

## 2017-10-14 ENCOUNTER — Ambulatory Visit (INDEPENDENT_AMBULATORY_CARE_PROVIDER_SITE_OTHER): Payer: BLUE CROSS/BLUE SHIELD

## 2017-10-14 VITALS — BP 116/72 | HR 87 | Temp 98.1°F | Ht 59.0 in | Wt 98.0 lb

## 2017-10-14 DIAGNOSIS — M329 Systemic lupus erythematosus, unspecified: Secondary | ICD-10-CM

## 2017-10-14 DIAGNOSIS — M5441 Lumbago with sciatica, right side: Secondary | ICD-10-CM | POA: Diagnosis not present

## 2017-10-14 DIAGNOSIS — G8929 Other chronic pain: Secondary | ICD-10-CM

## 2017-10-14 DIAGNOSIS — S32040A Wedge compression fracture of fourth lumbar vertebra, initial encounter for closed fracture: Secondary | ICD-10-CM | POA: Diagnosis not present

## 2017-10-14 DIAGNOSIS — M8000XA Age-related osteoporosis with current pathological fracture, unspecified site, initial encounter for fracture: Secondary | ICD-10-CM | POA: Diagnosis not present

## 2017-10-14 DIAGNOSIS — E108 Type 1 diabetes mellitus with unspecified complications: Secondary | ICD-10-CM

## 2017-10-14 DIAGNOSIS — M542 Cervicalgia: Secondary | ICD-10-CM | POA: Diagnosis not present

## 2017-10-14 DIAGNOSIS — M5442 Lumbago with sciatica, left side: Secondary | ICD-10-CM

## 2017-10-14 MED ORDER — HYDROCODONE-ACETAMINOPHEN 5-325 MG PO TABS: ORAL_TABLET | ORAL | 0 refills | Status: DC

## 2017-10-14 NOTE — Progress Notes (Signed)
Subjective:    Patient ID: Autumn Daniels, female    DOB: 04/16/1960, 57 y.o.   MRN: [id number]  HPI About three weeks ago (September 21, 2017) she was lifting bags of rocks weighing about 60 pounds.  She experienced lower back pain.  The pain did not go away.  She was seen by Darlyn Chamber, PCP and given Flexeril.  That did not help. She then was changed to Robaxin and that helped slightly.  She takes Toradol for chronic pain secondary to Lupus.  She was seen again by Darlyn Chamber, PCP last week and received an injection of Toradol.  That helped some.  She is taking Mobic as well given because of this lower back pain.  Her lower back pain has not gone away.  She still has lower back pain that goes into both upper thighs.  She is not sleeping well.  She also has some numbness at times of the right long finger and sometimes the right ring finger.  She is a very brittle diabetic and her last A1C was 15.  She had a blood sugar this morning of 487.  She has to be very careful of what she eats and takes. She cannot take any prednisone secondary to her severe diabetes.     As mentioned she also has Lupus, long standing.  She had a recent bone density study showing significant osteoporosis.    She had compression fracture of T11 in the past.  I have reviewed multiple notes on her concerning much of the above histories.  I have gone over her x-rays with her and shown them.  I have talked with her and gone over her history and what needs to be done now.  This took a long while, an hour total.  Review of Systems  Constitutional: Positive for activity change.  Musculoskeletal: Positive for arthralgias, back pain, gait problem, myalgias and neck pain.  Neurological: Positive for headaches.  Psychiatric/Behavioral: The patient is nervous/anxious.   All other systems reviewed and are negative.  For Review of Systems, all other systems reviewed and are negative.  Past Medical History:  Diagnosis Date  .  Anemia   . Anxiety   . Depression   . Diabetes mellitus without complication (HCC)    type 1  . Diarrhea    as of 12/2014 - pt states that she's had diarrhea for a year  . Fibromyalgia   . Forgetfulness 10/2016  . Frequent UTI    due to small urethra  . GERD (gastroesophageal reflux disease)   . Headache    hx of migraines  . History of hiatal hernia   . Lupus (HCC)   . Memory difficulty 10/2016  . Noncompliance   . Pneumonia   . Stevens-Johnson syndrome (HCC)   . Thyroid disease    has had radioactive iodine treatment for hyperthyroidism in 1990  . Vitamin D deficiency 07/07/2014    Past Surgical History:  Procedure Laterality Date  . CHOLECYSTECTOMY     2 bifurcations of liver during lap chole  . EXTERNAL FIXATION LEG Right 06/30/2014   Procedure: EXTERNAL FIXATION LEG;  Surgeon: Vickki Hearing, MD;  Location: [facility];  Service: Orthopedics;  Laterality: Right;  synthes large fragment exteranal fixation  c arm   . EXTERNAL FIXATION LEG Right 07/07/2014   Procedure: REVISION EXTERNAL FIXATION LEG;  Surgeon: Myrene Galas, MD;  Location: [facility] OR;  Service: Orthopedics;  Laterality: Right;  . EXTERNAL FIXATION REMOVAL Right 07/18/2014  Procedure: REMOVAL EXTERNAL FIXATION LEG;  Surgeon: Myrene GalasMichael Handy, MD;  [facility];  Service: Orthopedics;  Laterality: Right;  . HARDWARE REMOVAL Right 01/09/2015   Procedure: HARDWARE REMOVAL,RIGHT PROXIMAL TIBIAL;  Surgeon: Myrene GalasMichael Handy, MD;  [facility];  Service: Orthopedics;  Laterality: Right;  . KNEE ARTHROSCOPY Right 01/09/2015   Procedure: ARTHROSCOPY KNEE RIGHT ;  Surgeon: Myrene GalasMichael Handy, MD;  [facility];  Service: Orthopedics;  Laterality: Right;  . LIVER SURGERY     x4 due to bifurcations from lap chole  . ORIF TIBIA PLATEAU Right 07/18/2014   Procedure: RIGHT OPEN REDUCTION INTERNAL FIXATION (ORIF) TIBIA FRACTURE;  Surgeon: Myrene GalasMichael Handy, MD;  [facility];  Service: Orthopedics;  Laterality: Right;  ORIF of  bicondylar plateau fracture  . STERIOD INJECTION Bilateral 01/09/2015   Procedure: RIGHT RING FINGER AND LEFT RING FINGER STEROID INJECTION;  Surgeon: Myrene GalasMichael Handy, MD;  [facility];  Service: Orthopedics;  Laterality: Bilateral;  . TONSILLECTOMY      Current Outpatient Medications on File Prior to Visit  Medication Sig Dispense Refill  . doxycycline (VIBRA-TABS) 100 MG tablet Take 100 mg by mouth 2 (two) times daily.  0  . gabapentin (NEURONTIN) 300 MG capsule Take 1 capsule (300 mg total) by mouth 3 (three) times daily. 90 capsule 0  . Insulin Degludec (TRESIBA FLEXTOUCH) 200 UNIT/ML SOPN Inject 20 Units into the skin at bedtime.    . insulin lispro (HUMALOG KWIKPEN) 100 UNIT/ML KiwkPen Inject 10 Units into the skin 3 (three) times daily with meals. Increase amount as directed per sliding scale instructions    . levothyroxine (SYNTHROID, LEVOTHROID) 137 MCG tablet Take 1 tablet by mouth daily.  2  . LORazepam (ATIVAN) 0.5 MG tablet Take 0.5 mg by mouth every 8 (eight) hours as needed. for anxiety  0  . nicotine polacrilex (NICORETTE) 4 MG gum Take 4 mg by mouth as needed for smoking cessation.    Marland Kitchen. rOPINIRole (REQUIP) 1 MG tablet Take 1 mg by mouth at bedtime.     . traMADol (ULTRAM) 50 MG tablet Take 100 mg by mouth every 8 (eight) hours as needed for moderate pain.     . Vitamin D, Ergocalciferol, (DRISDOL) 50000 UNITS CAPS capsule Take 1 capsule (50,000 Units total) by mouth every 7 (seven) days. 8 capsule 1   No current facility-administered medications on file prior to visit.     Social History   Socioeconomic History  . Marital status: Divorced    Spouse name: Not on file  . Number of children: Not on file  . Years of education: Not on file  . Highest education level: Not on file  Occupational History  . Not on file  Social Needs  . Financial resource strain: Not on file  . Food insecurity:    Worry: Not on file    Inability: Not on file  . Transportation needs:     Medical: Not on file    Non-medical: Not on file  Tobacco Use  . Smoking status: Former Smoker    Packs/day: 0.50    Years: 30.00    Pack years: 15.00    Types: Cigarettes    Last attempt to quit: 05/29/2014    Years since quitting: 3.3  . Smokeless tobacco: Never Used  Substance and Sexual Activity  . Alcohol use: No  . Drug use: No  . Sexual activity: Not on file  Lifestyle  . Physical activity:    Days per week: Not on  file    Minutes per session: Not on file  . Stress: Not on file  Relationships  . Social connections:    Talks on phone: Not on file    Gets together: Not on file    Attends religious service: Not on file    Active member of club or organization: Not on file    Attends meetings of clubs or organizations: Not on file    Relationship status: Not on file  . Intimate partner violence:    Fear of current or ex partner: Not on file    Emotionally abused: Not on file    Physically abused: Not on file    Forced sexual activity: Not on file  Other Topics Concern  . Not on file  Social History Narrative  . Not on file    Family History  Problem Relation Age of Onset  . Colon cancer Mother   . Lung disease Father   . Diabetes Father   . Spina bifida Sister   . Diabetes Brother     BP 116/72   Pulse 87   Temp 98.1 F (36.7 C)   Ht 4\' 11"  (1.499 m)   Wt 98 lb (44.5 kg)   BMI 19.79 kg/m   Body mass index is 19.79 kg/m.     Objective:   Physical Exam  Constitutional: She is oriented to person, place, and time. She appears well-developed and well-nourished.  HENT:  Head: Normocephalic and atraumatic.  Eyes: Pupils are equal, round, and reactive to light. Conjunctivae and EOM are normal.  Neck: Normal range of motion. Neck supple.  Cardiovascular: Normal rate, regular rhythm and intact distal pulses.  Pulmonary/Chest: Effort normal.  Abdominal: Soft.  Musculoskeletal:       Back:  Neurological: She is alert and oriented to person, place, and  time. She has normal reflexes. She displays normal reflexes. No cranial nerve deficit. She exhibits normal muscle tone. Coordination normal.  Skin: Skin is warm and dry.  Psychiatric: She has a normal mood and affect. Her behavior is normal. Judgment and thought content normal.     X-rays were done of the cervical spine and lumbar spine, reported separately.  She has compression fracture new L4.     Assessment & Plan:   Encounter Diagnoses  Name Primary?  . Age-related osteoporosis with current pathological fracture, initial encounter   . Closed wedge compression fracture of fourth lumbar vertebra, initial encounter (HCC)   . Neck pain Yes  . Chronic bilateral low back pain with bilateral sciatica   . Type 1 diabetes mellitus with complication (HCC)   . Lupus (HCC)    I have explained the findings to her and recommended MRI of the lumbar spine.  I have called in pain medicine for her.  I have reviewed the [place] Controlled Substance Reporting System web site prior to prescribing narcotic medicine for this patient.  She has a brace for the back from prior compression fracture of T11 several years ago.  Return after MRI.  She is planning a trip to [place] in two weeks.  She may have to postpone.  Call if any problem.  Precautions discussed.   Electronically Signed Darreld Mclean, MD 7/24/201911:40 AM

## 2017-10-14 NOTE — Patient Instructions (Signed)
..  Your MRI has been ordered.  We will contact your insurance company for approval. After the authorization is received the MRI and a return appointment will be scheduled with you by phone.  If you do not hear from us within 5 business days please call [phone] and ask for the pre-authorization representative in our office.  

## 2017-10-21 ENCOUNTER — Ambulatory Visit (HOSPITAL_COMMUNITY)
Admission: RE | Admit: 2017-10-21 | Discharge: 2017-10-21 | Disposition: A | Discharge status: Home / Self Care | Payer: BLUE CROSS/BLUE SHIELD | Source: Ambulatory Visit | Attending: Orthopaedic Surgery | Admitting: Orthopaedic Surgery

## 2017-10-21 DIAGNOSIS — M5442 Lumbago with sciatica, left side: Secondary | ICD-10-CM

## 2017-10-21 DIAGNOSIS — M5441 Lumbago with sciatica, right side: Secondary | ICD-10-CM

## 2017-10-21 DIAGNOSIS — G8929 Other chronic pain: Secondary | ICD-10-CM | POA: Insufficient documentation

## 2017-10-21 DIAGNOSIS — M4856XA Collapsed vertebra, not elsewhere classified, lumbar region, initial encounter for fracture: Secondary | ICD-10-CM | POA: Diagnosis not present

## 2017-10-27 ENCOUNTER — Ambulatory Visit: Payer: BLUE CROSS/BLUE SHIELD | Admitting: Orthopaedic Surgery

## 2017-10-27 ENCOUNTER — Encounter: Payer: Self-pay | Admitting: Orthopaedic Surgery

## 2017-10-27 DIAGNOSIS — M8000XD Age-related osteoporosis with current pathological fracture, unspecified site, subsequent encounter for fracture with routine healing: Secondary | ICD-10-CM

## 2017-10-27 MED ORDER — HYDROCODONE-ACETAMINOPHEN 5-325 MG PO TABS: ORAL_TABLET | ORAL | 0 refills | Status: DC

## 2017-10-27 NOTE — Progress Notes (Signed)
CC:  What did the MRI show?  Her MRI showed a compression fracture at L4,no signs of lesions.  She is not wearing the CASH brace this morning.  I have told her it is important to wear when she is up.  She has delayed her travels until September 5.  NV intact. Gait is good.  Pain is controlled.  Encounter Diagnosis  Name Primary?  . Age-related osteoporosis with current pathological fracture with routine healing, subsequent encounter Yes   Return in one month.  X-rays then of the lumbar spine AP and Lateral.  I will renew her medicine.  I have reviewed the [place] Controlled Substance Reporting System web site prior to prescribing narcotic medicine for this patient.

## 2017-11-24 ENCOUNTER — Ambulatory Visit: Payer: BLUE CROSS/BLUE SHIELD | Admitting: Orthopaedic Surgery

## 2017-11-24 ENCOUNTER — Encounter: Payer: Self-pay | Admitting: Orthopaedic Surgery

## 2017-11-24 ENCOUNTER — Ambulatory Visit (INDEPENDENT_AMBULATORY_CARE_PROVIDER_SITE_OTHER): Payer: BLUE CROSS/BLUE SHIELD

## 2017-11-24 DIAGNOSIS — M8000XD Age-related osteoporosis with current pathological fracture, unspecified site, subsequent encounter for fracture with routine healing: Secondary | ICD-10-CM

## 2017-11-24 DIAGNOSIS — S32040D Wedge compression fracture of fourth lumbar vertebra, subsequent encounter for fracture with routine healing: Secondary | ICD-10-CM

## 2017-11-24 MED ORDER — HYDROCODONE-ACETAMINOPHEN 5-325 MG PO TABS: ORAL_TABLET | ORAL | 0 refills | Status: DC

## 2017-11-24 NOTE — Progress Notes (Signed)
CC:  My back is not hurting that much  She has less back pain.  She is set to leave to [place] for six weeks.  I have gone over precautions.  NV intact.    X-rays were done of the lumbar spine, reported separately.  Encounter Diagnoses  Name Primary?  . Age-related osteoporosis with current pathological fracture with routine healing, subsequent encounter Yes  . Closed wedge compression fracture of fourth lumbar vertebra with routine healing, subsequent encounter    Return in six weeks.  X-rays on return.  Call if any problem.  Precautions discussed.   Electronically Signed Darreld Mclean, MD 9/3/20198:32 AM

## 2017-12-29 ENCOUNTER — Encounter: Payer: Self-pay | Admitting: Orthopaedic Surgery

## 2017-12-29 ENCOUNTER — Ambulatory Visit (INDEPENDENT_AMBULATORY_CARE_PROVIDER_SITE_OTHER): Payer: BLUE CROSS/BLUE SHIELD | Admitting: Orthopaedic Surgery

## 2017-12-29 VITALS — BP 115/69 | HR 101 | Ht 59.0 in | Wt 98.0 lb

## 2017-12-29 DIAGNOSIS — M8000XD Age-related osteoporosis with current pathological fracture, unspecified site, subsequent encounter for fracture with routine healing: Secondary | ICD-10-CM

## 2017-12-29 NOTE — Progress Notes (Signed)
She has increased pain.  Our x-rays are down.  She needs an X-ray.  She will need to go to the hospital to get them.  She declines.  I offered to have her taken to the hospital in a wheelchair and brought back the same way.  She declines.  She says she has too much pain.  She walked out of the office.  I offered to see her another day.  She declined.  Electronically Signed Darreld Mclean, MD 10/8/20192:00 PM

## 2017-12-29 NOTE — Progress Notes (Signed)
Patient left without being seen. She has advised she can not go to [facility] for x rays today. Our x ray machine is out of service. She states she will not go for the x rays. I have cancelled the order and advised Dr Hilda Lias.

## 2018-01-03 ENCOUNTER — Other Ambulatory Visit: Payer: Self-pay

## 2018-01-03 ENCOUNTER — Emergency Department (HOSPITAL_COMMUNITY)
Admission: EM | Admit: 2018-01-03 | Discharge: 2018-01-03 | Disposition: A | Discharge status: Home / Self Care | Payer: BLUE CROSS/BLUE SHIELD | Attending: Emergency Medicine | Admitting: Emergency Medicine

## 2018-01-03 ENCOUNTER — Encounter (HOSPITAL_COMMUNITY): Payer: Self-pay | Admitting: Emergency Medicine

## 2018-01-03 DIAGNOSIS — Z79899 Other long term (current) drug therapy: Secondary | ICD-10-CM | POA: Diagnosis not present

## 2018-01-03 DIAGNOSIS — Z87891 Personal history of nicotine dependence: Secondary | ICD-10-CM | POA: Diagnosis not present

## 2018-01-03 DIAGNOSIS — E039 Hypothyroidism, unspecified: Secondary | ICD-10-CM | POA: Diagnosis not present

## 2018-01-03 DIAGNOSIS — R531 Weakness: Secondary | ICD-10-CM

## 2018-01-03 DIAGNOSIS — E1065 Type 1 diabetes mellitus with hyperglycemia: Secondary | ICD-10-CM | POA: Diagnosis not present

## 2018-01-03 DIAGNOSIS — E1069 Type 1 diabetes mellitus with other specified complication: Secondary | ICD-10-CM

## 2018-01-03 LAB — URINALYSIS, ROUTINE W REFLEX MICROSCOPIC
BILIRUBIN URINE: NEGATIVE
HGB URINE DIPSTICK: NEGATIVE
Ketones, ur: 20 mg/dL — AB
NITRITE: NEGATIVE
PROTEIN: NEGATIVE mg/dL
Specific Gravity, Urine: 1.031 — ABNORMAL HIGH (ref 1.005–1.030)
pH: 6 (ref 5.0–8.0)

## 2018-01-03 LAB — CBC WITH DIFFERENTIAL/PLATELET
ABS IMMATURE GRANULOCYTES: 0.02 10*3/uL (ref 0.00–0.07)
BASOS ABS: 0 10*3/uL (ref 0.0–0.1)
Basophils Relative: 0 %
EOS PCT: 1 %
Eosinophils Absolute: 0.1 10*3/uL (ref 0.0–0.5)
HEMATOCRIT: 46 % (ref 36.0–46.0)
Hemoglobin: 14.4 g/dL (ref 12.0–15.0)
IMMATURE GRANULOCYTES: 0 %
LYMPHS ABS: 2.7 10*3/uL (ref 0.7–4.0)
LYMPHS PCT: 40 %
MCH: 30.4 pg (ref 26.0–34.0)
MCHC: 31.3 g/dL (ref 30.0–36.0)
MCV: 97.3 fL (ref 80.0–100.0)
Monocytes Absolute: 0.5 10*3/uL (ref 0.1–1.0)
Monocytes Relative: 8 %
NEUTROS ABS: 3.4 10*3/uL (ref 1.7–7.7)
Neutrophils Relative %: 51 %
Platelets: 274 10*3/uL (ref 150–400)
RBC: 4.73 MIL/uL (ref 3.87–5.11)
RDW: 13.8 % (ref 11.5–15.5)
WBC: 6.8 10*3/uL (ref 4.0–10.5)
nRBC: 0 % (ref 0.0–0.2)

## 2018-01-03 LAB — COMPREHENSIVE METABOLIC PANEL
ALT: 18 U/L (ref 0–44)
ANION GAP: 9 (ref 5–15)
AST: 21 U/L (ref 15–41)
Albumin: 4.3 g/dL (ref 3.5–5.0)
Alkaline Phosphatase: 129 U/L — ABNORMAL HIGH (ref 38–126)
BUN: 14 mg/dL (ref 6–20)
CALCIUM: 10.1 mg/dL (ref 8.9–10.3)
CO2: 26 mmol/L (ref 22–32)
CREATININE: 0.52 mg/dL (ref 0.44–1.00)
Chloride: 101 mmol/L (ref 98–111)
GFR calc Af Amer: >60 mL/min (ref >60)
GFR calc non Af Amer: >60 mL/min (ref >60)
GLUCOSE: 204 mg/dL — AB (ref 70–99)
Potassium: 3.3 mmol/L — ABNORMAL LOW (ref 3.5–5.1)
Sodium: 136 mmol/L (ref 135–145)
TOTAL PROTEIN: 7.8 g/dL (ref 6.5–8.1)
Total Bilirubin: 0.3 mg/dL (ref 0.3–1.2)

## 2018-01-03 LAB — CK: Total CK: 55 U/L (ref 38–234)

## 2018-01-03 LAB — SEDIMENTATION RATE: Sed Rate: 5 mm/hr (ref 0–22)

## 2018-01-03 LAB — CBG MONITORING, ED: Glucose-Capillary: 198 mg/dL — ABNORMAL HIGH (ref 70–99)

## 2018-01-03 LAB — TSH: TSH: 18.12 u[IU]/mL — AB (ref 0.350–4.500)

## 2018-01-03 MED ORDER — SODIUM CHLORIDE 0.9 % IV BOLUS
1000.0000 mL | Freq: Once | INTRAVENOUS | Status: AC
  Administered 2018-01-03: 1000 mL via INTRAVENOUS

## 2018-01-03 NOTE — ED Provider Notes (Signed)
[facility] EMERGENCY DEPARTMENT Provider Note   CSN: [id number] Arrival date & time: 01/03/18  1119     History   Chief Complaint Chief Complaint  Patient presents with  . Hyperglycemia    HPI Autumn Daniels is a 57 y.o. female.  Patient reports bilateral leg weakness from the knees down for approximately 3 weeks.  She is a known type I diabetic.  Her glucose tends to run high.  No fever, chills, cough, chest pain, dyspnea, gross neurological deficits.  She is a retired P[employer] consultant.  Past medical history includes diabetes type 1, hypothyroidism, fibromyalgia, several others.  She was able to ambulate in the emergency department.  Her endocrinologist thinks it may be related to diabetic neuropathy.  Severity of symptoms is mild to moderate.  Nothing makes symptoms better or worse.     Past Medical History:  Diagnosis Date  . Anemia   . Anxiety   . Depression   . Diabetes mellitus without complication (HCC)    type 1  . Diarrhea    as of 12/2014 - pt states that she's had diarrhea for a year  . Fibromyalgia   . Forgetfulness 10/2016  . Frequent UTI    due to small urethra  . GERD (gastroesophageal reflux disease)   . Headache    hx of migraines  . History of hiatal hernia   . Lupus (HCC)   . Memory difficulty 10/2016  . Noncompliance   . Pneumonia   . Stevens-Johnson syndrome (HCC)   . Thyroid disease    has had radioactive iodine treatment for hyperthyroidism in 1990  . Vitamin D deficiency 07/07/2014    Patient Active Problem List   Diagnosis Date Noted  . Acute blood loss anemia 07/20/2014  . Vitamin D deficiency 07/07/2014  . Hypothyroidism following radioiodine therapy   . Type 1 diabetes mellitus, uncontrolled (HCC)   . GERD (gastroesophageal reflux disease)   . Lupus (HCC)   . Tibial plateau fracture 06/29/2014  . Closed fracture of thoracic vertebral body (HCC) 09/27/2013    Past Surgical History:  Procedure Laterality Date  . CHOLECYSTECTOMY     2  bifurcations of liver during lap chole  . EXTERNAL FIXATION LEG Right 06/30/2014   Procedure: EXTERNAL FIXATION LEG;  Surgeon: Vickki Hearing, MD;  Location: [facility];  Service: Orthopedics;  Laterality: Right;  synthes large fragment exteranal fixation  c arm   . EXTERNAL FIXATION LEG Right 07/07/2014   Procedure: REVISION EXTERNAL FIXATION LEG;  Surgeon: Myrene Galas, MD;  Location: [facility] OR;  Service: Orthopedics;  Laterality: Right;  . EXTERNAL FIXATION REMOVAL Right 07/18/2014   Procedure: REMOVAL EXTERNAL FIXATION LEG;  Surgeon: Myrene Galas, MD;  Location: [facility] OR;  Service: Orthopedics;  Laterality: Right;  . HARDWARE REMOVAL Right 01/09/2015   Procedure: HARDWARE REMOVAL,RIGHT PROXIMAL TIBIAL;  Surgeon: Myrene Galas, MD;  [facility] OR;  Service: Orthopedics;  Laterality: Right;  . KNEE ARTHROSCOPY Right 01/09/2015   Procedure: ARTHROSCOPY KNEE RIGHT ;  Surgeon: Myrene Galas, MD;  Location: [facility] OR;  Service: Orthopedics;  Laterality: Right;  . LIVER SURGERY     x4 due to bifurcations from lap chole  . ORIF TIBIA PLATEAU Right 07/18/2014   Procedure: RIGHT OPEN REDUCTION INTERNAL FIXATION (ORIF) TIBIA FRACTURE;  Surgeon: Myrene Galas, MD;  Location: [facility] OR;  Service: Orthopedics;  Laterality: Right;  ORIF of bicondylar plateau fracture  . STERIOD INJECTION Bilateral 01/09/2015   Procedure: RIGHT RING FINGER AND LEFT RING FINGER STEROID INJECTION;  Surgeon: Myrene Galas, MD;  Location: [facility] OR;  Service: Orthopedics;  Laterality: Bilateral;  . TONSILLECTOMY       OB History   None      Home Medications    Prior to Admission medications   Medication Sig Start Date End Date Taking? Authorizing Provider  gabapentin (NEURONTIN) 300 MG capsule Take 1 capsule (300 mg total) by mouth 3 (three) times daily. 07/20/14   Montez Morita, PA-C  HYDROcodone-acetaminophen (NORCO/VICODIN) 5-325 MG tablet One tablet every six hours for pain.  Limit 7 days. 11/24/17   Darreld Mclean, MD  Insulin Degludec  (TRESIBA FLEXTOUCH) 200 UNIT/ML SOPN Inject 20 Units into the skin at bedtime.    [provider]  insulin lispro (HUMALOG KWIKPEN) 100 UNIT/ML KiwkPen Inject 10 Units into the skin 3 (three) times daily with meals. Increase amount as directed per sliding scale instructions 06/13/15   [provider]  levothyroxine (SYNTHROID, LEVOTHROID) 137 MCG tablet Take 1 tablet by mouth daily. 06/18/17   [provider]  LORazepam (ATIVAN) 0.5 MG tablet Take 0.5 mg by mouth every 8 (eight) hours as needed. for anxiety 06/23/17   [provider]  nicotine polacrilex (NICORETTE) 4 MG gum Take 4 mg by mouth as needed for smoking cessation.    [provider]  rOPINIRole (REQUIP) 1 MG tablet Take 1 mg by mouth at bedtime.  04/18/16   [provider]  traMADol (ULTRAM) 50 MG tablet Take 100 mg by mouth every 8 (eight) hours as needed for moderate pain.     [provider]  Vitamin D, Ergocalciferol, (DRISDOL) 50000 UNITS CAPS capsule Take 1 capsule (50,000 Units total) by mouth every 7 (seven) days. 07/20/14   Montez Morita, PA-C    Family History Family History  Problem Relation Age of Onset  . Colon cancer Mother   . Lung disease Father   . Diabetes Father   . Spina bifida Sister   . Diabetes Brother     Social History Social History   Tobacco Use  . Smoking status: Former Smoker    Packs/day: 0.50    Years: 30.00    Pack years: 15.00    Types: Cigarettes    Last attempt to quit: 05/29/2014    Years since quitting: 3.6  . Smokeless tobacco: Never Used  Substance Use Topics  . Alcohol use: No  . Drug use: No     Allergies   Penicillins; Sulfa antibiotics; and Metformin and related   Review of Systems Review of Systems  All other systems reviewed and are negative.    Physical Exam Updated Vital Signs BP 135/74 (BP Location: Right Arm)   Pulse 64   Temp 98.3 F (36.8 C) (Oral)   Resp 16   Ht 4\' 11"  (1.499 m)   Wt 42.6 kg    SpO2 99%   BMI 18.99 kg/m   Physical Exam  Constitutional: She is oriented to person, place, and time. She appears well-developed and well-nourished.  nad  HENT:  Head: Normocephalic and atraumatic.  Eyes: Conjunctivae are normal.  Neck: Neck supple.  Cardiovascular: Normal rate and regular rhythm.  Pulmonary/Chest: Effort normal and breath sounds normal.  Abdominal: Soft. Bowel sounds are normal.  Musculoskeletal: Normal range of motion.  Neurological: She is alert and oriented to person, place, and time.  Patient is able to move both legs independently.  Skin: Skin is warm and dry.  Psychiatric: She has a normal mood and affect. Her behavior is normal.  Nursing note and vitals reviewed.    ED Treatments / Results  Labs (all labs ordered are listed, but only abnormal results are displayed) Labs Reviewed  COMPREHENSIVE METABOLIC PANEL - Abnormal; Notable for the following components:      Result Value   Potassium 3.3 (*)    Glucose, Bld 204 (*)    Alkaline Phosphatase 129 (*)    All other components within normal limits  URINALYSIS, ROUTINE W REFLEX MICROSCOPIC - Abnormal; Notable for the following components:   APPearance HAZY (*)    Specific Gravity, Urine 1.031 (*)    Glucose, UA >=500 (*)    Ketones, ur 20 (*)    Leukocytes, UA TRACE (*)    Bacteria, UA RARE (*)    All other components within normal limits  TSH - Abnormal; Notable for the following components:   TSH 18.120 (*)    All other components within normal limits  CBG MONITORING, ED - Abnormal; Notable for the following components:   Glucose-Capillary 198 (*)    All other components within normal limits  CBC WITH DIFFERENTIAL/PLATELET  SEDIMENTATION RATE  CK    EKG None  Radiology No results found.  Procedures Procedures (including critical care time)  Medications Ordered in ED Medications  sodium chloride 0.9 % bolus 1,000 mL (0 mLs Intravenous Stopped 01/03/18 1301)     Initial  Impression / Assessment and Plan / ED Course  I have reviewed the triage vital signs and the nursing notes.  Pertinent labs & imaging results that were available during my care of the patient were reviewed by me and considered in my medical decision making (see chart for details).     Patient presents with bilateral leg weakness.  She is not in DKA.  TSH is elevated (18.12).  I recommended that she increase her thyroid medication.  Sge will discuss this with her endocrinologist this week.  Final Clinical Impressions(s) / ED Diagnoses   Final diagnoses:  Weakness  Hypothyroidism, unspecified type  Type 1 diabetes mellitus with other specified complication [facility])    ED Discharge Orders    None       Donnetta Hutching, MD 01/03/18 1556

## 2018-01-03 NOTE — ED Triage Notes (Signed)
Pt states she has been out of the country for the past 6 weeks in [place].  Got back into country on Monday.  States about 3 weeks into her trip began feeling poorly with leg pain, nausea, and vomiting.  Went to general practitioner in [place] and was told she was in DKA but it would be 6000.00 to be evaluated in the ED to determine if further treatment needed, so pt declined.  States her glucose readings have been in the 500s.

## 2018-01-03 NOTE — Discharge Instructions (Addendum)
TSH [thyroid-stimulating hormone] was 18.12.  You will need to have your thyroid medicine increased.  Call your endocrinologist tomorrow for further recommendations.  Glucose minimally elevated.  Otherwise, your tests were good.

## 2018-05-04 ENCOUNTER — Encounter (HOSPITAL_COMMUNITY): Payer: Self-pay | Admitting: Emergency Medicine

## 2018-05-04 ENCOUNTER — Emergency Department (HOSPITAL_COMMUNITY): Payer: Medicare PPO

## 2018-05-04 ENCOUNTER — Emergency Department (HOSPITAL_COMMUNITY)
Admission: EM | Admit: 2018-05-04 | Discharge: 2018-05-04 | Disposition: A | Discharge status: Home / Self Care | Payer: Medicare PPO | Attending: Emergency Medicine | Admitting: Emergency Medicine

## 2018-05-04 ENCOUNTER — Other Ambulatory Visit: Payer: Self-pay

## 2018-05-04 DIAGNOSIS — F419 Anxiety disorder, unspecified: Secondary | ICD-10-CM | POA: Insufficient documentation

## 2018-05-04 DIAGNOSIS — M79605 Pain in left leg: Secondary | ICD-10-CM | POA: Diagnosis not present

## 2018-05-04 DIAGNOSIS — Z87891 Personal history of nicotine dependence: Secondary | ICD-10-CM | POA: Insufficient documentation

## 2018-05-04 DIAGNOSIS — E109 Type 1 diabetes mellitus without complications: Secondary | ICD-10-CM | POA: Diagnosis not present

## 2018-05-04 DIAGNOSIS — Z9049 Acquired absence of other specified parts of digestive tract: Secondary | ICD-10-CM | POA: Diagnosis not present

## 2018-05-04 DIAGNOSIS — E039 Hypothyroidism, unspecified: Secondary | ICD-10-CM | POA: Diagnosis not present

## 2018-05-04 DIAGNOSIS — F329 Major depressive disorder, single episode, unspecified: Secondary | ICD-10-CM | POA: Insufficient documentation

## 2018-05-04 DIAGNOSIS — M79604 Pain in right leg: Secondary | ICD-10-CM | POA: Diagnosis not present

## 2018-05-04 DIAGNOSIS — R531 Weakness: Secondary | ICD-10-CM

## 2018-05-04 LAB — URINALYSIS, ROUTINE W REFLEX MICROSCOPIC
BACTERIA UA: NONE SEEN
Bilirubin Urine: NEGATIVE
Glucose, UA: >=500 mg/dL — AB
Hgb urine dipstick: NEGATIVE
KETONES UR: 80 mg/dL — AB
Leukocytes,Ua: NEGATIVE
Nitrite: NEGATIVE
PROTEIN: NEGATIVE mg/dL
Specific Gravity, Urine: 1.032 — ABNORMAL HIGH (ref 1.005–1.030)
pH: 6 (ref 5.0–8.0)

## 2018-05-04 LAB — CBC WITH DIFFERENTIAL/PLATELET
Abs Immature Granulocytes: 0.01 10*3/uL (ref 0.00–0.07)
Basophils Absolute: 0 10*3/uL (ref 0.0–0.1)
Basophils Relative: 0 %
EOS PCT: 0 %
Eosinophils Absolute: 0 10*3/uL (ref 0.0–0.5)
HEMATOCRIT: 50.1 % — AB (ref 36.0–46.0)
Hemoglobin: 16.4 g/dL — ABNORMAL HIGH (ref 12.0–15.0)
IMMATURE GRANULOCYTES: 0 %
LYMPHS ABS: 2.9 10*3/uL (ref 0.7–4.0)
Lymphocytes Relative: 35 %
MCH: 31.1 pg (ref 26.0–34.0)
MCHC: 32.7 g/dL (ref 30.0–36.0)
MCV: 94.9 fL (ref 80.0–100.0)
MONOS PCT: 6 %
Monocytes Absolute: 0.5 10*3/uL (ref 0.1–1.0)
NEUTROS PCT: 59 %
Neutro Abs: 4.9 10*3/uL (ref 1.7–7.7)
Platelets: 231 10*3/uL (ref 150–400)
RBC: 5.28 MIL/uL — ABNORMAL HIGH (ref 3.87–5.11)
RDW: 12.8 % (ref 11.5–15.5)
WBC: 8.3 10*3/uL (ref 4.0–10.5)
nRBC: 0 % (ref 0.0–0.2)

## 2018-05-04 LAB — BASIC METABOLIC PANEL
ANION GAP: 12 (ref 5–15)
BUN: 10 mg/dL (ref 6–20)
CALCIUM: 10.3 mg/dL (ref 8.9–10.3)
CO2: 25 mmol/L (ref 22–32)
Chloride: 101 mmol/L (ref 98–111)
Creatinine, Ser: 0.42 mg/dL — ABNORMAL LOW (ref 0.44–1.00)
GFR calc Af Amer: >60 mL/min (ref >60)
GFR calc non Af Amer: >60 mL/min (ref >60)
GLUCOSE: 310 mg/dL — AB (ref 70–99)
Potassium: 3.1 mmol/L — ABNORMAL LOW (ref 3.5–5.1)
Sodium: 138 mmol/L (ref 135–145)

## 2018-05-04 LAB — TROPONIN I

## 2018-05-04 LAB — TSH: TSH: 18.351 u[IU]/mL — AB (ref 0.350–4.500)

## 2018-05-04 LAB — CBG MONITORING, ED: GLUCOSE-CAPILLARY: 299 mg/dL — AB (ref 70–99)

## 2018-05-04 MED ORDER — GABAPENTIN 400 MG PO CAPS: 400.0000 mg | ORAL_CAPSULE | Freq: Three times a day (TID) | ORAL | 2 refills | Status: DC

## 2018-05-04 MED ORDER — SODIUM CHLORIDE 0.9 % IV BOLUS
1000.0000 mL | Freq: Once | INTRAVENOUS | Status: AC
  Administered 2018-05-04: 1000 mL via INTRAVENOUS

## 2018-05-04 NOTE — Care Management (Signed)
ED CM at [facility] attempted to reach the patient to discuss home health. The patient has been discharged from the [facility] Emergency Department. CM called the patient using her home telephone number and discussed order for home health. Home health order faxed to [facility]. Patient verbalizes understanding that [facility] will outreach to schedule a visit. Margaretha Glassing RN CCM

## 2018-05-04 NOTE — ED Provider Notes (Signed)
[facility] Emergency Department Provider Note MRN:  [id number]  Arrival date & time: 05/04/18     Chief Complaint   Weakness   History of Present Illness   Autumn CurlingMary Taddeo is a 58 y.o. year-old female with a history of diabetes, lupus, fibromyalgia presenting to the ED with chief complaint of weakness.  Generalized weakness, difficulty getting out of bed for the past 3 days.  Denies fever, no headache or vision change, endorsing central chest tightness that she states is related to anxiety, worse when she feels anxious.  Denies shortness of breath, no abdominal pain.  Patient was recently diagnosed with a UTI by her PCP, started on ciprofloxacin yesterday, has taken 3 doses.  Patient goes on to explain she has been having burning bilateral leg pain for several months, not getting better on gabapentin.  Patient denies bowel or bladder dysfunction, no numbness or weakness to the arms or legs, no fever, no IV drug use.  Review of Systems  A complete 10 system review of systems was obtained and all systems are negative except as noted in the HPI and PMH.   Patient's Health History    Past Medical History:  Diagnosis Date  . Anemia   . Anxiety   . Depression   . Diabetes mellitus without complication (HCC)    type 1  . Diarrhea    as of 12/2014 - pt states that she's had diarrhea for a year  . Fibromyalgia   . Forgetfulness 10/2016  . Frequent UTI    due to small urethra  . GERD (gastroesophageal reflux disease)   . Headache    hx of migraines  . History of hiatal hernia   . Lupus (HCC)   . Memory difficulty 10/2016  . Noncompliance   . Pneumonia   . Stevens-Johnson syndrome (HCC)   . Thyroid disease    has had radioactive iodine treatment for hyperthyroidism in 1990  . Vitamin D deficiency 07/07/2014    Past Surgical History:  Procedure Laterality Date  . CHOLECYSTECTOMY     2 bifurcations of liver during lap chole  . EXTERNAL FIXATION LEG Right 06/30/2014     Procedure: EXTERNAL FIXATION LEG;  Surgeon: Vickki HearingStanley E Harrison, MD;  Location: [facility];  Service: Orthopedics;  Laterality: Right;  synthes large fragment exteranal fixation  c arm   . EXTERNAL FIXATION LEG Right 07/07/2014   Procedure: REVISION EXTERNAL FIXATION LEG;  Surgeon: Myrene GalasMichael Handy, MD;  [facility];  Service: Orthopedics;  Laterality: Right;  . EXTERNAL FIXATION REMOVAL Right 07/18/2014   Procedure: REMOVAL EXTERNAL FIXATION LEG;  Surgeon: Myrene GalasMichael Handy, MD;  [facility];  Service: Orthopedics;  Laterality: Right;  . HARDWARE REMOVAL Right 01/09/2015   Procedure: HARDWARE REMOVAL,RIGHT PROXIMAL TIBIAL;  Surgeon: Myrene GalasMichael Handy, MD;  [facility];  Service: Orthopedics;  Laterality: Right;  . KNEE ARTHROSCOPY Right 01/09/2015   Procedure: ARTHROSCOPY KNEE RIGHT ;  Surgeon: Myrene GalasMichael Handy, MD;  [facility];  Service: Orthopedics;  Laterality: Right;  . LIVER SURGERY     x4 due to bifurcations from lap chole  . ORIF TIBIA PLATEAU Right 07/18/2014   Procedure: RIGHT OPEN REDUCTION INTERNAL FIXATION (ORIF) TIBIA FRACTURE;  Surgeon: Myrene GalasMichael Handy, MD;  [facility];  Service: Orthopedics;  Laterality: Right;  ORIF of bicondylar plateau fracture  . STERIOD INJECTION Bilateral 01/09/2015   Procedure: RIGHT RING FINGER AND LEFT RING FINGER STEROID INJECTION;  Surgeon: Myrene GalasMichael Handy, MD;  [facility];  Service:  Orthopedics;  Laterality: Bilateral;  . TONSILLECTOMY      Family History  Problem Relation Age of Onset  . Colon cancer Mother   . Lung disease Father   . Diabetes Father   . Spina bifida Sister   . Diabetes Brother     Social History   Socioeconomic History  . Marital status: Divorced    Spouse name: Not on file  . Number of children: Not on file  . Years of education: Not on file  . Highest education level: Not on file  Occupational History  . Not on file  Social Needs  . Financial resource strain: Not on file  . Food insecurity:    Worry: Not on  file    Inability: Not on file  . Transportation needs:    Medical: Not on file    Non-medical: Not on file  Tobacco Use  . Smoking status: Former Smoker    Packs/day: 0.50    Years: 30.00    Pack years: 15.00    Types: Cigarettes    Last attempt to quit: 05/29/2014    Years since quitting: 3.9  . Smokeless tobacco: Never Used  Substance and Sexual Activity  . Alcohol use: No  . Drug use: No  . Sexual activity: Not on file  Lifestyle  . Physical activity:    Days per week: Not on file    Minutes per session: Not on file  . Stress: Not on file  Relationships  . Social connections:    Talks on phone: Not on file    Gets together: Not on file    Attends religious service: Not on file    Active member of club or organization: Not on file    Attends meetings of clubs or organizations: Not on file    Relationship status: Not on file  . Intimate partner violence:    Fear of current or ex partner: Not on file    Emotionally abused: Not on file    Physically abused: Not on file    Forced sexual activity: Not on file  Other Topics Concern  . Not on file  Social History Narrative  . Not on file     Physical Exam  Vital Signs and Nursing Notes reviewed Vitals:   05/04/18 1416  BP: 132/84  Pulse: 100  Resp: 14  Temp: 98.4 F (36.9 C)  SpO2: 97%    CONSTITUTIONAL: Well-appearing, NAD NEURO:  Alert and oriented x 3, no focal deficits, normal and symmetric strength and sensation, 2+ patellar reflexes bilaterally EYES:  eyes equal and reactive ENT/NECK:  no LAD, no JVD CARDIO: Regular rate, well-perfused, normal S1 and S2 PULM:  CTAB no wheezing or rhonchi GI/GU:  normal bowel sounds, non-distended, non-tender MSK/SPINE:  No gross deformities, no edema SKIN:  no rash, atraumatic PSYCH:  Appropriate speech and behavior  Diagnostic and Interventional Summary    EKG Interpretation  Date/Time:  Tuesday May 04 2018 16:46:02 EST Ventricular Rate:  79 PR Interval:      QRS Duration: 89 QT Interval:  374 QTC Calculation: 429 R Axis:   47 Text Interpretation:  Sinus rhythm RSR' in V1 or V2, probably normal variant Nonspecific T abnormalities, anterior leads Confirmed by Kennis Carina [phone]) on 05/04/2018 4:57:16 PM      Labs Reviewed  CBC WITH DIFFERENTIAL/PLATELET - Abnormal; Notable for the following components:      Result Value   RBC 5.28 (*)    Hemoglobin 16.4 (*)  HCT 50.1 (*)    All other components within normal limits  BASIC METABOLIC PANEL - Abnormal; Notable for the following components:   Potassium 3.1 (*)    Glucose, Bld 310 (*)    Creatinine, Ser 0.42 (*)    All other components within normal limits  URINALYSIS, ROUTINE W REFLEX MICROSCOPIC - Abnormal; Notable for the following components:   Color, Urine STRAW (*)    Specific Gravity, Urine 1.032 (*)    Glucose, UA >=500 (*)    Ketones, ur 80 (*)    All other components within normal limits  TSH - Abnormal; Notable for the following components:   TSH 18.351 (*)    All other components within normal limits  CBG MONITORING, ED - Abnormal; Notable for the following components:   Glucose-Capillary 299 (*)    All other components within normal limits  TROPONIN I    DG Chest 2 View  Final Result      Medications  sodium chloride 0.9 % bolus 1,000 mL (1,000 mLs Intravenous New Bag/Given 05/04/18 1650)     Procedures Critical Care  ED Course and Medical Decision Making  I have reviewed the triage vital signs and the nursing notes.  Pertinent labs & imaging results that were available during my care of the patient were reviewed by me and considered in my medical decision making (see below for details).  Atypical and low risk chest pain, chronic bilateral leg pain with minimal back pain, no red flags to suggest myelopathy.  Generalized weakness favored to be related to the UTI, will screen for occult pneumonia, metabolic disarray.  Labs with no evidence of DKA.  Labs reveal  mild hypokalemia, elevated TSH, signs of mild hemoconcentration.  Chest x-ray is largely unrevealing, there is a compression fracture that is known to the patient.  No indication for further imaging or admission today, will follow-up with PCP to discuss levothyroxine dose, will refill patient's gabapentin, referral made to case management for possible home health assistance given her trouble making meals for herself due to her symptoms.  After the discussed management above, the patient was determined to be safe for discharge.  The patient was in agreement with this plan and all questions regarding their care were answered.  ED return precautions were discussed and the patient will return to the ED with any significant worsening of condition.  Elmer Sow. Pilar Plate, MD [facility] Emergency Medicine [facility] [email or web address]  Final Clinical Impressions(s) / ED Diagnoses     ICD-10-CM   1. Pain in both lower extremities M79.604    M79.605   2. Weakness R53.1 DG Chest 2 View    DG Chest 2 View  3. Hypothyroidism, unspecified type E03.9     ED Discharge Orders         Ordered    gabapentin (NEURONTIN) 400 MG capsule  3 times daily     05/04/18 1814             Sabas Sous, MD 05/04/18 [phone]

## 2018-05-04 NOTE — ED Triage Notes (Signed)
Patient complaining of generalized weakness and bilateral leg pain x 3 days. States she was started on antibiotics yesterday for UTI.

## 2018-05-04 NOTE — Discharge Instructions (Addendum)
You were evaluated in the Emergency Department and after careful evaluation, we did not find any emergent condition requiring admission or further testing in the hospital.  Your generalized weakness today seem to be due to a combination of dehydration, low potassium, your urinary tract infection, and your thyroid function.  Please discuss your thyroid medication dosage with your primary care doctor.  Please restart your gabapentin as discussed to help with your burning leg pain.  This medication will likely need a dosage increase, which your regular doctor can help you with as well.  Continue to take your antibiotic for UTI as directed.  Please return to the Emergency Department if you experience any worsening of your condition.  We encourage you to follow up with a primary care provider.  Thank you for allowing [place] to be a part of your care.

## 2019-06-30 ENCOUNTER — Ambulatory Visit: Payer: Medicare PPO | Attending: Internal Medicine

## 2019-06-30 ENCOUNTER — Other Ambulatory Visit: Payer: Self-pay

## 2019-06-30 DIAGNOSIS — Z23 Encounter for immunization: Secondary | ICD-10-CM

## 2019-06-30 NOTE — Progress Notes (Signed)
   Covid-19 Vaccination Clinic  Name:  Autumn Daniels    MRN: [id number] DOB: [birth year]  06/30/2019  Autumn Daniels was observed post Covid-19 immunization for 15 minutes without incident. She was provided with Vaccine Information Sheet and instruction to access the V-Safe system.   Autumn Daniels was instructed to call 911 with any severe reactions post vaccine: Marland Kitchen Difficulty breathing  . Swelling of face and throat  . A fast heartbeat  . A bad rash all over body  . Dizziness and weakness   Immunizations Administered    Name Date Dose VIS Date Route   Moderna COVID-19 Vaccine 06/30/2019 11:04 AM 0.5 mL 02/22/2019 Intramuscular   Manufacturer: Moderna   Lot: [id number]   NDC: 72761-848-59

## 2019-08-02 ENCOUNTER — Ambulatory Visit: Payer: Medicare PPO | Attending: Internal Medicine

## 2019-08-02 DIAGNOSIS — Z23 Encounter for immunization: Secondary | ICD-10-CM

## 2019-08-02 NOTE — Progress Notes (Signed)
   Covid-19 Vaccination Clinic  Name:  Autumn Daniels    MRN: [id number] DOB: 1960/06/03  08/02/2019  [name] was observed post Covid-19 immunization for 30 minutes based on pre-vaccination screening without incident. She was provided with Vaccine Information Sheet and instruction to access the V-Safe system.   [name] was instructed to call 911 with any severe reactions post vaccine: Marland Kitchen Difficulty breathing  . Swelling of face and throat  . A fast heartbeat  . A bad rash all over body  . Dizziness and weakness   Immunizations Administered    Name Date Dose VIS Date Route   Moderna COVID-19 Vaccine 08/02/2019 10:51 AM 0.5 mL 02/2019 Intramuscular   Manufacturer: Moderna   Lot: [id number]   NDC: 76808-811-03

## 2019-11-05 ENCOUNTER — Emergency Department (HOSPITAL_BASED_OUTPATIENT_CLINIC_OR_DEPARTMENT_OTHER)
Admission: EM | Admit: 2019-11-05 | Discharge: 2019-11-05 | Disposition: A | Discharge status: Home / Self Care | Payer: Medicare PPO | Attending: Emergency Medicine | Admitting: Emergency Medicine

## 2019-11-05 ENCOUNTER — Encounter (HOSPITAL_BASED_OUTPATIENT_CLINIC_OR_DEPARTMENT_OTHER): Payer: Self-pay

## 2019-11-05 ENCOUNTER — Other Ambulatory Visit: Payer: Self-pay

## 2019-11-05 ENCOUNTER — Emergency Department (HOSPITAL_BASED_OUTPATIENT_CLINIC_OR_DEPARTMENT_OTHER): Payer: Medicare PPO

## 2019-11-05 DIAGNOSIS — R1012 Left upper quadrant pain: Secondary | ICD-10-CM | POA: Insufficient documentation

## 2019-11-05 DIAGNOSIS — N390 Urinary tract infection, site not specified: Secondary | ICD-10-CM

## 2019-11-05 DIAGNOSIS — F329 Major depressive disorder, single episode, unspecified: Secondary | ICD-10-CM | POA: Diagnosis not present

## 2019-11-05 DIAGNOSIS — R0789 Other chest pain: Secondary | ICD-10-CM | POA: Insufficient documentation

## 2019-11-05 DIAGNOSIS — R638 Other symptoms and signs concerning food and fluid intake: Secondary | ICD-10-CM | POA: Insufficient documentation

## 2019-11-05 DIAGNOSIS — R109 Unspecified abdominal pain: Secondary | ICD-10-CM | POA: Diagnosis present

## 2019-11-05 DIAGNOSIS — K440 Diaphragmatic hernia with obstruction, without gangrene: Secondary | ICD-10-CM | POA: Insufficient documentation

## 2019-11-05 DIAGNOSIS — E876 Hypokalemia: Secondary | ICD-10-CM | POA: Diagnosis not present

## 2019-11-05 DIAGNOSIS — R300 Dysuria: Secondary | ICD-10-CM | POA: Diagnosis not present

## 2019-11-05 DIAGNOSIS — K219 Gastro-esophageal reflux disease without esophagitis: Secondary | ICD-10-CM | POA: Insufficient documentation

## 2019-11-05 DIAGNOSIS — R509 Fever, unspecified: Secondary | ICD-10-CM | POA: Insufficient documentation

## 2019-11-05 DIAGNOSIS — E039 Hypothyroidism, unspecified: Secondary | ICD-10-CM | POA: Insufficient documentation

## 2019-11-05 DIAGNOSIS — M329 Systemic lupus erythematosus, unspecified: Secondary | ICD-10-CM | POA: Insufficient documentation

## 2019-11-05 DIAGNOSIS — M797 Fibromyalgia: Secondary | ICD-10-CM | POA: Diagnosis not present

## 2019-11-05 DIAGNOSIS — E109 Type 1 diabetes mellitus without complications: Secondary | ICD-10-CM | POA: Insufficient documentation

## 2019-11-05 DIAGNOSIS — Z87891 Personal history of nicotine dependence: Secondary | ICD-10-CM | POA: Insufficient documentation

## 2019-11-05 LAB — COMPREHENSIVE METABOLIC PANEL
ALT: 16 U/L (ref 0–44)
AST: 19 U/L (ref 15–41)
Albumin: 4.4 g/dL (ref 3.5–5.0)
Alkaline Phosphatase: 73 U/L (ref 38–126)
Anion gap: 10 (ref 5–15)
BUN: 15 mg/dL (ref 6–20)
CO2: 29 mmol/L (ref 22–32)
Calcium: 10 mg/dL (ref 8.9–10.3)
Chloride: 94 mmol/L — ABNORMAL LOW (ref 98–111)
Creatinine, Ser: 0.52 mg/dL (ref 0.44–1.00)
GFR calc Af Amer: >60 mL/min (ref >60)
GFR calc non Af Amer: >60 mL/min (ref >60)
Glucose, Bld: 296 mg/dL — ABNORMAL HIGH (ref 70–99)
Potassium: 2.9 mmol/L — ABNORMAL LOW (ref 3.5–5.1)
Sodium: 133 mmol/L — ABNORMAL LOW (ref 135–145)
Total Bilirubin: 0.7 mg/dL (ref 0.3–1.2)
Total Protein: 7.5 g/dL (ref 6.5–8.1)

## 2019-11-05 LAB — URINALYSIS, ROUTINE W REFLEX MICROSCOPIC
Glucose, UA: >=500 mg/dL — AB
Hgb urine dipstick: NEGATIVE
Ketones, ur: NEGATIVE mg/dL
Nitrite: NEGATIVE
Protein, ur: 100 mg/dL — AB
Specific Gravity, Urine: 1.025 (ref 1.005–1.030)
pH: 6.5 (ref 5.0–8.0)

## 2019-11-05 LAB — URINALYSIS, MICROSCOPIC (REFLEX)

## 2019-11-05 LAB — CBC
HCT: 50.6 % — ABNORMAL HIGH (ref 36.0–46.0)
Hemoglobin: 17.1 g/dL — ABNORMAL HIGH (ref 12.0–15.0)
MCH: 31.5 pg (ref 26.0–34.0)
MCHC: 33.8 g/dL (ref 30.0–36.0)
MCV: 93.4 fL (ref 80.0–100.0)
Platelets: 255 10*3/uL (ref 150–400)
RBC: 5.42 MIL/uL — ABNORMAL HIGH (ref 3.87–5.11)
RDW: 12.7 % (ref 11.5–15.5)
WBC: 9.1 10*3/uL (ref 4.0–10.5)
nRBC: 0 % (ref 0.0–0.2)

## 2019-11-05 LAB — TROPONIN I (HIGH SENSITIVITY): Troponin I (High Sensitivity): 4 ng/L (ref <18)

## 2019-11-05 LAB — MAGNESIUM: Magnesium: 1.7 mg/dL (ref 1.7–2.4)

## 2019-11-05 LAB — PREGNANCY, URINE: Preg Test, Ur: NEGATIVE

## 2019-11-05 LAB — LIPASE, BLOOD: Lipase: 17 U/L (ref 11–51)

## 2019-11-05 MED ORDER — CEPHALEXIN 500 MG PO CAPS: 500.0000 mg | ORAL_CAPSULE | Freq: Three times a day (TID) | ORAL | 0 refills | Status: AC

## 2019-11-05 MED ORDER — FENTANYL CITRATE (PF) 100 MCG/2ML IJ SOLN
50.0000 ug | INTRAMUSCULAR | Status: DC | PRN
  Administered 2019-11-05: 50 ug via NASAL
  Filled 2019-11-05: qty 2

## 2019-11-05 MED ORDER — FENTANYL CITRATE (PF) 100 MCG/2ML IJ SOLN
25.0000 ug | Freq: Once | INTRAMUSCULAR | Status: AC
  Administered 2019-11-05: 25 ug via INTRAVENOUS
  Filled 2019-11-05: qty 2

## 2019-11-05 MED ORDER — SODIUM CHLORIDE 0.9 % IV BOLUS
1000.0000 mL | Freq: Once | INTRAVENOUS | Status: AC
  Administered 2019-11-05: 1000 mL via INTRAVENOUS

## 2019-11-05 MED ORDER — POTASSIUM CHLORIDE 10 MEQ/100ML IV SOLN
10.0000 meq | Freq: Once | INTRAVENOUS | Status: AC
  Administered 2019-11-05: 10 meq via INTRAVENOUS
  Filled 2019-11-05: qty 100

## 2019-11-05 MED ORDER — POTASSIUM CHLORIDE CRYS ER 20 MEQ PO TBCR
40.0000 meq | EXTENDED_RELEASE_TABLET | Freq: Once | ORAL | Status: AC
  Administered 2019-11-05: 40 meq via ORAL
  Filled 2019-11-05: qty 2

## 2019-11-05 MED ORDER — MORPHINE SULFATE (PF) 4 MG/ML IV SOLN
4.0000 mg | Freq: Once | INTRAVENOUS | Status: AC
  Administered 2019-11-05: 4 mg via INTRAVENOUS
  Filled 2019-11-05: qty 1

## 2019-11-05 MED ORDER — ONDANSETRON 4 MG PO TBDP
4.0000 mg | ORAL_TABLET | Freq: Once | ORAL | Status: AC
  Administered 2019-11-05: 4 mg via ORAL
  Filled 2019-11-05: qty 1

## 2019-11-05 MED ORDER — IOHEXOL 350 MG/ML SOLN
100.0000 mL | Freq: Once | INTRAVENOUS | Status: AC | PRN
  Administered 2019-11-05: 75 mL via INTRAVENOUS

## 2019-11-05 MED ORDER — ONDANSETRON HCL 4 MG/2ML IJ SOLN
4.0000 mg | Freq: Once | INTRAMUSCULAR | Status: AC
  Administered 2019-11-05: 4 mg via INTRAVENOUS
  Filled 2019-11-05: qty 2

## 2019-11-05 NOTE — Discharge Instructions (Addendum)
Please follow-up with your primary care regarding your visit today.  Discuss your incidental CT findings. Stop taking the Macrobid/nitrofurantoin antibiotic, and begin taking cephalexin/Keflex, 3 times daily until gone. You can treat your symptoms with over-the-counter medications as needed. You can take a stool softener to help with constipation. Increase your potassium rich foods. Return to the ER for severely worsening symptoms.

## 2019-11-05 NOTE — ED Provider Notes (Signed)
[facility] EMERGENCY DEPARTMENT Provider Note   CSN: [id number] Arrival date & time: 11/05/19  1532     History Chief Complaint  Patient presents with  . Abdominal Pain    Autumn Daniels is a 59 y.o. female past medical history of depression, type 1 diabetes, fibromyalgia, GERD, hiatal hernia, lupus who presents for evaluation of pain that has been ongoing for the last 2 weeks.  She reports that 2 weeks ago, she started developing a pain underneath her left breast that radiates into the left side and left upper abdomen.  She states that sometimes it will be a cramping pain and then other times, she feels like there is a belt wrapped around her chest and abdomen and states that somebody is pulling it very tight.  She states that this has persisted.  She went to her primary care doctor and was found to have UTI as well as hypokalemia and was started on antibiotics (Macrobid) 3 days ago.  She states that she was also prescribed hydrocodone to help with the pain.  She reports that despite the medications, the pain has persisted.  She denies any other alleviating or aggravating factors.  She states that it is worse when she takes a deep breath in.  She states that she has had associated nausea but denies any vomiting.  She states that because of the nausea, she has not been wanting to eat.  She also reports fever that developed over last 2 days.  She states it is measured at home up to 101.3.  She states that she is continued to have some dysuria.  She has had about a 6 pound weight loss over the last 2 weeks because she has not been eating.  She states she still been taking her insulin.  She states that her blood sugars have been running in the 300s-400s which she states is not abnormal for her.  She denies any OCP use, recent immobilization, prior history of DVT/PE, recent surgery, leg swelling, or long travel.  She does not smoke.  Denies any history of hypertension.  No personal cardiac history.   No family cardiac history.  Her last bowel movement was this morning and was normal.  She denies any diarrhea.   The history is provided by the patient.       Past Medical History:  Diagnosis Date  . Anemia   . Anxiety   . Depression   . Diabetes mellitus without complication (HCC)    type 1  . Diarrhea    as of 12/2014 - pt states that she's had diarrhea for a year  . Fibromyalgia   . Forgetfulness 10/2016  . Frequent UTI    due to small urethra  . GERD (gastroesophageal reflux disease)   . Headache    hx of migraines  . History of hiatal hernia   . Lupus (HCC)   . Memory difficulty 10/2016  . Noncompliance   . Pneumonia   . Stevens-Johnson syndrome (HCC)   . Thyroid disease    has had radioactive iodine treatment for hyperthyroidism in 1990  . Vitamin D deficiency 07/07/2014    Patient Active Problem List   Diagnosis Date Noted  . Acute blood loss anemia 07/20/2014  . Vitamin D deficiency 07/07/2014  . Hypothyroidism following radioiodine therapy   . Type 1 diabetes mellitus, uncontrolled (HCC)   . GERD (gastroesophageal reflux disease)   . Lupus (HCC)   . Tibial plateau fracture 06/29/2014  . Closed fracture  of thoracic vertebral body (HCC) 09/27/2013    Past Surgical History:  Procedure Laterality Date  . CHOLECYSTECTOMY     2 bifurcations of liver during lap chole  . EXTERNAL FIXATION LEG Right 06/30/2014   Procedure: EXTERNAL FIXATION LEG;  Surgeon: Vickki Hearing, MD;  Location: [facility];  Service: Orthopedics;  Laterality: Right;  synthes large fragment exteranal fixation  c arm   . EXTERNAL FIXATION LEG Right 07/07/2014   Procedure: REVISION EXTERNAL FIXATION LEG;  Surgeon: Myrene Galas, MD;  Location: [facility] OR;  Service: Orthopedics;  Laterality: Right;  . EXTERNAL FIXATION REMOVAL Right 07/18/2014   Procedure: REMOVAL EXTERNAL FIXATION LEG;  Surgeon: Myrene Galas, MD;  Location: [facility] OR;  Service: Orthopedics;  Laterality: Right;  . HARDWARE REMOVAL  Right 01/09/2015   Procedure: HARDWARE REMOVAL,RIGHT PROXIMAL TIBIAL;  Surgeon: Myrene Galas, MD;  Location: [facility] OR;  Service: Orthopedics;  Laterality: Right;  . KNEE ARTHROSCOPY Right 01/09/2015   Procedure: ARTHROSCOPY KNEE RIGHT ;  Surgeon: Myrene Galas, MD;  Location: [facility] OR;  Service: Orthopedics;  Laterality: Right;  . LIVER SURGERY     x4 due to bifurcations from lap chole  . ORIF TIBIA PLATEAU Right 07/18/2014   Procedure: RIGHT OPEN REDUCTION INTERNAL FIXATION (ORIF) TIBIA FRACTURE;  Surgeon: Myrene Galas, MD;  Location: [facility] OR;  Service: Orthopedics;  Laterality: Right;  ORIF of bicondylar plateau fracture  . STERIOD INJECTION Bilateral 01/09/2015   Procedure: RIGHT RING FINGER AND LEFT RING FINGER STEROID INJECTION;  Surgeon: Myrene Galas, MD;  Location: [facility] OR;  Service: Orthopedics;  Laterality: Bilateral;  . TONSILLECTOMY       OB History   No obstetric history on file.     Family History  Problem Relation Age of Onset  . Colon cancer Mother   . Lung disease Father   . Diabetes Father   . Spina bifida Sister   . Diabetes Brother     Social History   Tobacco Use  . Smoking status: Former Smoker    Packs/day: 0.50    Years: 30.00    Pack years: 15.00    Types: Cigarettes    Quit date: 05/29/2014    Years since quitting: 5.4  . Smokeless tobacco: Never Used  Substance Use Topics  . Alcohol use: No  . Drug use: No    Home Medications Prior to Admission medications   Medication Sig Start Date End Date Taking? Authorizing Provider  cephALEXin (KEFLEX) 500 MG capsule Take 1 capsule (500 mg total) by mouth 3 (three) times daily for 7 days. 11/05/19 11/12/19  Robinson, [place] N, PA-C  ciprofloxacin (CIPRO) 500 MG tablet Take 500 mg by mouth 2 (two) times daily. 7 day course starting on 05/03/2018 05/03/18   [provider]  gabapentin (NEURONTIN) 400 MG capsule Take 400 mg by mouth 3 (three) times daily.    [provider]  gabapentin (NEURONTIN) 400  MG capsule Take 1 capsule (400 mg total) by mouth 3 (three) times daily. 05/04/18   Sabas Sous, MD  HYDROcodone-acetaminophen (NORCO/VICODIN) 5-325 MG tablet One tablet every six hours for pain.  Limit 7 days. Patient taking differently: Take 0.5-1 tablets by mouth every 6 (six) hours as needed for moderate pain or severe pain.  11/24/17   Darreld Mclean, MD  Insulin Degludec (TRESIBA FLEXTOUCH) 200 UNIT/ML SOPN Inject 8 Units into the skin at bedtime.     [provider]  Insulin Lispro (HUMALOG KWIKPEN) 200 UNIT/ML SOPN Inject 10 Units into the  skin 3 (three) times daily with meals. Increase amount as directed per personal sliding scale instructions 06/13/15   [provider]  levothyroxine (SYNTHROID, LEVOTHROID) 137 MCG tablet Take 1 tablet by mouth every morning.  06/18/17   [provider]  LORazepam (ATIVAN) 0.5 MG tablet Take 0.5 mg by mouth every 8 (eight) hours as needed. for anxiety 06/23/17   [provider]  nicotine polacrilex (NICORETTE) 4 MG gum Take 4 mg by mouth as needed for smoking cessation.    [provider]  Potassium 99 MG TABS Take 1 tablet by mouth daily as needed (for leg cramping).    [provider]  rOPINIRole (REQUIP) 1 MG tablet Take 1 mg by mouth at bedtime.  04/18/16   [provider]  traMADol (ULTRAM) 50 MG tablet Take 100 mg by mouth every 8 (eight) hours as needed for moderate pain.     [provider]  VITAMIN D PO Take 1 capsule by mouth daily.    [provider]    Allergies    Penicillins and Sulfa antibiotics  Review of Systems   Review of Systems  Constitutional: Positive for appetite change, fatigue and fever.  Respiratory: Negative for cough and shortness of breath.   Cardiovascular: Positive for chest pain.  Gastrointestinal: Positive for abdominal pain and nausea. Negative for vomiting.  Genitourinary: Positive for dysuria. Negative for hematuria.  Neurological:  Negative for headaches.  All other systems reviewed and are negative.   Physical Exam Updated Vital Signs BP 134/87   Pulse 79   Temp 98.5 F (36.9 C) (Oral)   Resp 18   Ht  (1.473 m)   Wt 35.8 kg   SpO2 100%   BMI 16.51 kg/m   Physical Exam Vitals and nursing note reviewed.  Constitutional:      Appearance: Normal appearance. She is well-developed.     Comments: Appears uncomfortable, but NAD.   HENT:     Head: Normocephalic and atraumatic.  Eyes:     General: Lids are normal.     Conjunctiva/sclera: Conjunctivae normal.     Pupils: Pupils are equal, round, and reactive to light.  Cardiovascular:     Rate and Rhythm: Normal rate and regular rhythm.     Pulses: Normal pulses.     Heart sounds: Normal heart sounds. No murmur heard.  No friction rub. No gallop.   Pulmonary:     Effort: Pulmonary effort is normal.     Breath sounds: Normal breath sounds.     Comments: Lungs clear to auscultation bilaterally.  Symmetric chest rise.  No wheezing, rales, rhonchi. Chest:     Comments: No reproducible pain noted on palpation of the left lower chest. Abdominal:     Palpations: Abdomen is soft. Abdomen is not rigid.     Tenderness: There is abdominal tenderness in the left upper quadrant. There is no guarding.     Comments: Abdomen soft, nondistended.  Mild tenderness in the left upper quadrant.  No rigidity, guarding.  No CVA tenderness.  Musculoskeletal:        General: Normal range of motion.     Cervical back: Full passive range of motion without pain.  Skin:    General: Skin is warm and dry.     Capillary Refill: Capillary refill takes less than 2 seconds.  Neurological:     Mental Status: She is alert and oriented to person, place, and time.  Psychiatric:  Speech: Speech normal.     ED Results / Procedures / Treatments   Labs (all labs ordered are listed, but only abnormal results are displayed) Labs Reviewed  COMPREHENSIVE METABOLIC PANEL -  Abnormal; Notable for the following components:      Result Value   Sodium 133 (*)    Potassium 2.9 (*)    Chloride 94 (*)    Glucose, Bld 296 (*)    All other components within normal limits  CBC - Abnormal; Notable for the following components:   RBC 5.42 (*)    Hemoglobin 17.1 (*)    HCT 50.6 (*)    All other components within normal limits  URINALYSIS, ROUTINE W REFLEX MICROSCOPIC - Abnormal; Notable for the following components:   Color, Urine AMBER (*)    APPearance CLOUDY (*)    Glucose, UA >=500 (*)    Bilirubin Urine SMALL (*)    Protein, ur 100 (*)    Leukocytes,Ua TRACE (*)    All other components within normal limits  URINALYSIS, MICROSCOPIC (REFLEX) - Abnormal; Notable for the following components:   Bacteria, UA MANY (*)    All other components within normal limits  URINE CULTURE  LIPASE, BLOOD  PREGNANCY, URINE  MAGNESIUM  TROPONIN I (HIGH SENSITIVITY)    EKG EKG Interpretation  Date/Time:  Saturday November 05 2019 17:37:19 EDT Ventricular Rate:  91 PR Interval:    QRS Duration: 88 QT Interval:  372 QTC Calculation: 458 R Axis:   4 Text Interpretation: Sinus rhythm Low voltage, extremity and precordial leads Consider anterior infarct Baseline wander in lead(s) V6 No significant change since last tracing Confirmed by Susy Frizzle [id number]) on 11/05/2019 6:48:54 PM   Radiology CT Angio Chest PE W and/or Wo Contrast  Result Date: 11/05/2019 CLINICAL DATA:  Respiratory distress. EXAM: CT ANGIOGRAPHY CHEST WITH CONTRAST TECHNIQUE: Multidetector CT imaging of the chest was performed using the standard protocol during bolus administration of intravenous contrast. Multiplanar CT image reconstructions and MIPs were obtained to evaluate the vascular anatomy. CONTRAST:  29mL OMNIPAQUE IOHEXOL 350 MG/ML SOLN COMPARISON:  None. FINDINGS: Cardiovascular: Satisfactory opacification of the pulmonary arteries to the segmental level. No evidence of pulmonary embolism.  Normal heart size. No pericardial effusion. Mediastinum/Nodes: No enlarged mediastinal, hilar, or axillary lymph nodes. Thyroid gland, trachea, and esophagus demonstrate no significant findings. Lungs/Pleura: A cluster of subcentimeter noncalcified lung nodule is seen within the posterolateral aspect of the left lung base, just above the left hemidiaphragm. The largest measures approximately 8 mm (axial CT image 63, CT series number 2). There is no evidence of acute infiltrate, pleural effusion or pneumothorax. Upper Abdomen: A metallic density surgical clip is seen within the gallbladder fossa. Moderate severity pneumobilia is noted. Musculoskeletal: Chronic compression fracture deformities are seen involving the T11 and T12 vertebral bodies. Review of the MIP images confirms the above findings. IMPRESSION: 1. No evidence of pulmonary embolism or other acute intrathoracic process. 2. Cluster of subcentimeter noncalcified lung nodule within the posterolateral aspect of the left lung base, just above the left hemidiaphragm. The largest measures approximately 8 mm. Correlation with follow-up chest CT is recommended in 3-6 months. 3. Chronic compression fracture deformities involving the T11 and T12 vertebral bodies. 4. Metallic density surgical clip within the gallbladder fossa with moderate severity pneumobilia. Electronically Signed   By: Aram Candela M.D.   On: 11/05/2019 19:40   CT ABDOMEN PELVIS W CONTRAST  Result Date: 11/05/2019 CLINICAL DATA:  Left upper quadrant pain and tenderness  for weeks. Nausea. EXAM: CT ABDOMEN AND PELVIS WITH CONTRAST TECHNIQUE: Multidetector CT imaging of the abdomen and pelvis was performed using the standard protocol following bolus administration of intravenous contrast. Patient declined enteric contrast. CONTRAST:  75mL OMNIPAQUE IOHEXOL 350 MG/ML SOLN COMPARISON:  Chest CTA performed concurrently, reported separately. No prior abdominal imaging. FINDINGS: Lower chest:  Assessed on concurrent chest CT, reported separately. Left lower lobe pulmonary nodules, extent better assessed on concurrent chest CT. Hepatobiliary: Cholecystectomy. Pneumobilia is likely postprocedural. Proximal common bile duct is dilated measuring 3.5 cm at the porta hepatis. There is tapering at the duodenal insertion. No discrete focal hepatic lesion. Pancreas: No peripancreatic inflammation. Upper normal pancreatic duct in the body and tail at 3 mm. No evidence of pancreatic mass. Spleen: Normal in size without focal abnormality. Adrenals/Urinary Tract: No adrenal nodule. No hydronephrosis or perinephric edema. Homogeneous renal enhancement with symmetric excretion on delayed phase imaging. Urinary bladder is minimally distended and not well assessed. No evidence of bladder wall thickening or perivesicular edema. Stomach/Bowel: Bowel evaluation is limited in the absence of enteric contrast and paucity of intra-abdominal fat. Stomach is physiologically distended without evidence of gastric wall thickening. There is no small bowel obstruction or abnormal distention. Few fluid-filled small bowel loops in the pelvis. Normal appendix, for example series 7, image 42. Moderately large volume of stool in the ascending, transverse, and descending colon small volume of stool in the sigmoid colon. No abnormal rectal distention. No obvious colonic wall thickening or pericolonic edema. Vascular/Lymphatic: Moderate aortic atherosclerosis. No aortic aneurysm. The portal vein is patent. Mesenteric vasculature appears patent. Dilated left ovarian vein at 6 mm. There is no abdominopelvic adenopathy. Reproductive: Prominent periuterine and adnexal vascularity with dilated left ovarian vein at 6 mm. Coarse calcification in the left adnexa may represent an ovarian calcification or pedunculated calcified fibroid. No suspicious adnexal mass, pelvic bowel loops partially obscure detailed adnexal assessment. Other: No ascites or  free air. Musculoskeletal: The bones are under mineralized. Chronic L4 compression fracture, similar in appearance to 10/21/2017 lumbar MRI. T12 compression fracture only partially included in the field of view. IMPRESSION: 1. Prominent periuterine and adnexal vascularity with dilated left ovarian vein, can be seen with pelvic congestion syndrome in the appropriate clinical setting. 2. Moderately large volume of stool in the ascending, transverse, and descending colon, can be seen with constipation. No bowel obstruction or inflammation. 3. Cholecystectomy. Pneumobilia is likely postprocedural. Proximal common bile duct is dilated measuring 3.5 cm at the porta hepatis, possibly postprocedural. Recommend correlation with LFTs. If LFTs are elevated, consider further evaluation with MRCP or ERCP. 4. Left lower lobe pulmonary nodules, extent better assessed on concurrent chest CT. 5. Chronic L4 compression fracture, similar in appearance to 10/21/2017 lumbar MRI. T12 compression fracture only partially included in the field of view. Aortic Atherosclerosis (ICD10-I70.0). Electronically Signed   By: Narda RutherfordMelanie  Sanford M.D.   On: 11/05/2019 19:45    Procedures Procedures (including critical care time)  Medications Ordered in ED Medications  ondansetron (ZOFRAN-ODT) disintegrating tablet 4 mg (4 mg Oral Given 11/05/19 1621)  sodium chloride 0.9 % bolus 1,000 mL (0 mLs Intravenous Stopped 11/05/19 2028)  ondansetron (ZOFRAN) injection 4 mg (4 mg Intravenous Given 11/05/19 1809)  fentaNYL (SUBLIMAZE) injection 25 mcg (25 mcg Intravenous Given 11/05/19 1810)  potassium chloride 10 mEq in 100 mL IVPB (0 mEq Intravenous Stopped 11/05/19 2028)  potassium chloride SA (KLOR-CON) CR tablet 40 mEq (40 mEq Oral Given 11/05/19 1854)  morphine 4 MG/ML injection 4  mg (4 mg Intravenous Given 11/05/19 1857)  iohexol (OMNIPAQUE) 350 MG/ML injection 100 mL (75 mLs Intravenous Contrast Given 11/05/19 1914)    ED Course  I have  reviewed the triage vital signs and the nursing notes.  Pertinent labs & imaging results that were available during my care of the patient were reviewed by me and considered in my medical decision making (see chart for details).    MDM Rules/Calculators/A&P                          59 year old female with past medical history of lupus, fibromyalgia, depression, type 1 diabetes who presents for evaluation of abdominal pain, pain underneath her left breast x2 weeks. She states that she has been seen by her primary care doctor and was diagnosed with UTI and has been on antibiotics for last 3 days. States she still having some dysuria. She also reports associated fevers, nausea. Pain is worse with deep inspiration and intermittently feels like it is a tightening around her chest. Honestly room, she is afebrile, nontoxic-appearing. Vital signs are stable. On exam, she has tenderness palpation noted left upper quadrant abdomen. I am unable to palpate any tenderness of the left lower chest. No overlying rash that would be concerning for shingles. Labs ordered at triage. Doubt ACS etiology as it sounds atypical. Consider infectious etiology. Also consider PE. She does have history of lupus and does report that pain is pleuritic in nature. Labs ordered at triage.  CMP shows potassium of 2.9. Glucose is 296. BUN and creatinine within normal limits. Anion gap is 10. Bicarb is 29. Work-up not consistent with DKA. Will plan for potassium repletion as well as checking her magnesium. CBC shows no leukocytosis. Hemoglobin is 17.1. Lipase is unremarkable. UA shows trace leukocytes, pyuria, bacteria but there is squamous epithelium so question contaminant. She is still symptomatic. She has never been on Macrobid in the past and may benefit from switching to Keflex. We will plan to send for culture.  Patient has tolerated Keflex in the past.   Patient signed out to [place] Robinson, PA-C pending trop, mag, CTA of chest and  CT abd/pelvis.  Portions of this note were generated with Scientist, clinical (histocompatibility and immunogenetics). Dictation errors may occur despite best attempts at proofreading.  Final Clinical Impression(s) / ED Diagnoses Final diagnoses:  Hypokalemia  Left upper quadrant abdominal pain  Acute UTI    Rx / DC Orders ED Discharge Orders         Ordered    cephALEXin (KEFLEX) 500 MG capsule  3 times daily     Discontinue  Reprint     11/05/19 2034           Maxwell Caul, PA-C 11/06/19 1011    Pollyann Savoy, MD 11/06/19 1355

## 2019-11-05 NOTE — ED Triage Notes (Signed)
Pt c/o LUQ abd pain and tenderness x 2.5 weeks. Pt has associated nausea. Denies vomiting and diarrhea. Pt has been evaluated by PCP for same and pt being treated for UTI. Pt advised by PCP to come to ED for CT scan. Pt taking hydrocodone without relief.

## 2019-11-05 NOTE — ED Provider Notes (Signed)
Care assumed at shift change from [facility], see her note for full HPI and work-up.  Briefly, patient presenting with 2 weeks of left side upper abd pain under her breast.  She reports pain catches her breath and is pleuritic. PCP evaluated for this on the 3rd. UTI sx started Thursday, prescribed macrobid.  Patient continues to have dysuria and freq. Fever this morning, nausea, fatigue. Pain is worsening. LUQ pain is reproducble on exam, low suspicion for cardiac etiology. Insulin dependent diabetic, normal gap today.  Hypokalemia, replaced, mg level pending.   CTA rule out PE, CT AP r/o intra-abd infxn. Switch abx to keflex, tolerated well before. Hydrocodone prescribed for pain.  If neg, d/c with keflex and outpt follow up. Physical Exam  BP 109/79 (BP Location: Left Arm)   Pulse 100   Temp 98 F (36.7 C) (Oral)   Resp 20   Ht 4\' 10"  (1.473 m)   Wt 35.8 kg   SpO2 98%   BMI 16.51 kg/m   Physical Exam Vitals and nursing note reviewed.  Constitutional:      General: She is not in acute distress.    Appearance: She is well-developed.  HENT:     Head: Normocephalic and atraumatic.  Eyes:     Conjunctiva/sclera: Conjunctivae normal.  Cardiovascular:     Rate and Rhythm: Normal rate.  Pulmonary:     Effort: Pulmonary effort is normal.  Neurological:     Mental Status: She is alert.  Psychiatric:        Mood and Affect: Mood normal.        Behavior: Behavior normal.     ED Course/Procedures     Procedures Results for orders placed or performed during the hospital encounter of 11/05/19  Lipase, blood  Result Value Ref Range   Lipase 17 11 - 51 U/L  Comprehensive metabolic panel  Result Value Ref Range   Sodium 133 (L) 135 - 145 mmol/L   Potassium 2.9 (L) 3.5 - 5.1 mmol/L   Chloride 94 (L) 98 - 111 mmol/L   CO2 29 22 - 32 mmol/L   Glucose, Bld 296 (H) 70 - 99 mg/dL   BUN 15 6 - 20 mg/dL   Creatinine, Ser 11/07/19 0.44 - 1.00 mg/dL   Calcium 1.75 8.9 - 10.2 mg/dL   Total  Protein 7.5 6.5 - 8.1 g/dL   Albumin 4.4 3.5 - 5.0 g/dL   AST 19 15 - 41 U/L   ALT 16 0 - 44 U/L   Alkaline Phosphatase 73 38 - 126 U/L   Total Bilirubin 0.7 0.3 - 1.2 mg/dL   GFR calc non Af Amer >60 >60 mL/min   GFR calc Af Amer >60 >60 mL/min   Anion gap 10 5 - 15  CBC  Result Value Ref Range   WBC 9.1 4.0 - 10.5 K/uL   RBC 5.42 (H) 3.87 - 5.11 MIL/uL   Hemoglobin 17.1 (H) 12.0 - 15.0 g/dL   HCT 58.5 (H) 36 - 46 %   MCV 93.4 80.0 - 100.0 fL   MCH 31.5 26.0 - 34.0 pg   MCHC 33.8 30.0 - 36.0 g/dL   RDW 27.7 82.4 - 23.5 %   Platelets 255 150 - 400 K/uL   nRBC 0.0 0.0 - 0.2 %  Urinalysis, Routine w reflex microscopic Urine, Clean Catch  Result Value Ref Range   Color, Urine AMBER (A) YELLOW   APPearance CLOUDY (A) CLEAR   Specific Gravity, Urine 1.025 1.005 -  1.030   pH 6.5 5.0 - 8.0   Glucose, UA >=500 (A) NEGATIVE mg/dL   Hgb urine dipstick NEGATIVE NEGATIVE   Bilirubin Urine SMALL (A) NEGATIVE   Ketones, ur NEGATIVE NEGATIVE mg/dL   Protein, ur 509 (A) NEGATIVE mg/dL   Nitrite NEGATIVE NEGATIVE   Leukocytes,Ua TRACE (A) NEGATIVE  Pregnancy, urine  Result Value Ref Range   Preg Test, Ur NEGATIVE NEGATIVE  Urinalysis, Microscopic (reflex)  Result Value Ref Range   RBC / HPF 0-5 0 - 5 RBC/hpf   WBC, UA 11-20 0 - 5 WBC/hpf   Bacteria, UA MANY (A) NONE SEEN   Squamous Epithelial / LPF 11-20 0 - 5   Mucus PRESENT    Budding Yeast PRESENT   Magnesium  Result Value Ref Range   Magnesium 1.7 1.7 - 2.4 mg/dL  Troponin I (High Sensitivity)  Result Value Ref Range   Troponin I (High Sensitivity) 4 <18 ng/L   CT Angio Chest PE W and/or Wo Contrast  Result Date: 11/05/2019 CLINICAL DATA:  Respiratory distress. EXAM: CT ANGIOGRAPHY CHEST WITH CONTRAST TECHNIQUE: Multidetector CT imaging of the chest was performed using the standard protocol during bolus administration of intravenous contrast. Multiplanar CT image reconstructions and MIPs were obtained to evaluate the vascular  anatomy. CONTRAST:  59mL OMNIPAQUE IOHEXOL 350 MG/ML SOLN COMPARISON:  None. FINDINGS: Cardiovascular: Satisfactory opacification of the pulmonary arteries to the segmental level. No evidence of pulmonary embolism. Normal heart size. No pericardial effusion. Mediastinum/Nodes: No enlarged mediastinal, hilar, or axillary lymph nodes. Thyroid gland, trachea, and esophagus demonstrate no significant findings. Lungs/Pleura: A cluster of subcentimeter noncalcified lung nodule is seen within the posterolateral aspect of the left lung base, just above the left hemidiaphragm. The largest measures approximately 8 mm (axial CT image 63, CT series number 2). There is no evidence of acute infiltrate, pleural effusion or pneumothorax. Upper Abdomen: A metallic density surgical clip is seen within the gallbladder fossa. Moderate severity pneumobilia is noted. Musculoskeletal: Chronic compression fracture deformities are seen involving the T11 and T12 vertebral bodies. Review of the MIP images confirms the above findings. IMPRESSION: 1. No evidence of pulmonary embolism or other acute intrathoracic process. 2. Cluster of subcentimeter noncalcified lung nodule within the posterolateral aspect of the left lung base, just above the left hemidiaphragm. The largest measures approximately 8 mm. Correlation with follow-up chest CT is recommended in 3-6 months. 3. Chronic compression fracture deformities involving the T11 and T12 vertebral bodies. 4. Metallic density surgical clip within the gallbladder fossa with moderate severity pneumobilia. Electronically Signed   By: Aram Candela M.D.   On: 11/05/2019 19:40   CT ABDOMEN PELVIS W CONTRAST  Result Date: 11/05/2019 CLINICAL DATA:  Left upper quadrant pain and tenderness for weeks. Nausea. EXAM: CT ABDOMEN AND PELVIS WITH CONTRAST TECHNIQUE: Multidetector CT imaging of the abdomen and pelvis was performed using the standard protocol following bolus administration of intravenous  contrast. Patient declined enteric contrast. CONTRAST:  34mL OMNIPAQUE IOHEXOL 350 MG/ML SOLN COMPARISON:  Chest CTA performed concurrently, reported separately. No prior abdominal imaging. FINDINGS: Lower chest: Assessed on concurrent chest CT, reported separately. Left lower lobe pulmonary nodules, extent better assessed on concurrent chest CT. Hepatobiliary: Cholecystectomy. Pneumobilia is likely postprocedural. Proximal common bile duct is dilated measuring 3.5 cm at the porta hepatis. There is tapering at the duodenal insertion. No discrete focal hepatic lesion. Pancreas: No peripancreatic inflammation. Upper normal pancreatic duct in the body and tail at 3 mm. No evidence of pancreatic mass.  Spleen: Normal in size without focal abnormality. Adrenals/Urinary Tract: No adrenal nodule. No hydronephrosis or perinephric edema. Homogeneous renal enhancement with symmetric excretion on delayed phase imaging. Urinary bladder is minimally distended and not well assessed. No evidence of bladder wall thickening or perivesicular edema. Stomach/Bowel: Bowel evaluation is limited in the absence of enteric contrast and paucity of intra-abdominal fat. Stomach is physiologically distended without evidence of gastric wall thickening. There is no small bowel obstruction or abnormal distention. Few fluid-filled small bowel loops in the pelvis. Normal appendix, for example series 7, image 42. Moderately large volume of stool in the ascending, transverse, and descending colon small volume of stool in the sigmoid colon. No abnormal rectal distention. No obvious colonic wall thickening or pericolonic edema. Vascular/Lymphatic: Moderate aortic atherosclerosis. No aortic aneurysm. The portal vein is patent. Mesenteric vasculature appears patent. Dilated left ovarian vein at 6 mm. There is no abdominopelvic adenopathy. Reproductive: Prominent periuterine and adnexal vascularity with dilated left ovarian vein at 6 mm. Coarse  calcification in the left adnexa may represent an ovarian calcification or pedunculated calcified fibroid. No suspicious adnexal mass, pelvic bowel loops partially obscure detailed adnexal assessment. Other: No ascites or free air. Musculoskeletal: The bones are under mineralized. Chronic L4 compression fracture, similar in appearance to 10/21/2017 lumbar MRI. T12 compression fracture only partially included in the field of view. IMPRESSION: 1. Prominent periuterine and adnexal vascularity with dilated left ovarian vein, can be seen with pelvic congestion syndrome in the appropriate clinical setting. 2. Moderately large volume of stool in the ascending, transverse, and descending colon, can be seen with constipation. No bowel obstruction or inflammation. 3. Cholecystectomy. Pneumobilia is likely postprocedural. Proximal common bile duct is dilated measuring 3.5 cm at the porta hepatis, possibly postprocedural. Recommend correlation with LFTs. If LFTs are elevated, consider further evaluation with MRCP or ERCP. 4. Left lower lobe pulmonary nodules, extent better assessed on concurrent chest CT. 5. Chronic L4 compression fracture, similar in appearance to 10/21/2017 lumbar MRI. T12 compression fracture only partially included in the field of view. Aortic Atherosclerosis (ICD10-I70.0). Electronically Signed   By: Narda Rutherford M.D.   On: 11/05/2019 19:45    MDM  CT scans are negative for acute pathology as cause of patient's symptoms.  Mag level within normal limits and troponin is negative.  Discussed all incidental findings and and instruction to follow-up with PCP.  Will prescribe keflex per previous provider recommendation, urine culture pending. Recommended stool softener as needed for constipation. Return precautions discussed. Patient agreeable with plan, requesting discharge.      Mivaan Corbitt, [place] N, PA-C 11/05/19 2106    Pollyann Savoy, MD 11/05/19 [phone]

## 2019-11-07 LAB — URINE CULTURE: Culture: 50000 — AB

## 2020-06-27 ENCOUNTER — Encounter (HOSPITAL_COMMUNITY): Payer: Self-pay | Admitting: Emergency Medicine

## 2020-06-27 ENCOUNTER — Emergency Department (HOSPITAL_COMMUNITY): Payer: Medicare PPO

## 2020-06-27 ENCOUNTER — Emergency Department (HOSPITAL_COMMUNITY)
Admission: EM | Admit: 2020-06-27 | Discharge: 2020-06-28 | Disposition: A | Discharge status: Home / Self Care | Payer: Medicare PPO | Attending: Emergency Medicine | Admitting: Emergency Medicine

## 2020-06-27 ENCOUNTER — Other Ambulatory Visit: Payer: Self-pay

## 2020-06-27 DIAGNOSIS — E109 Type 1 diabetes mellitus without complications: Secondary | ICD-10-CM | POA: Diagnosis not present

## 2020-06-27 DIAGNOSIS — E876 Hypokalemia: Secondary | ICD-10-CM

## 2020-06-27 DIAGNOSIS — Z794 Long term (current) use of insulin: Secondary | ICD-10-CM | POA: Diagnosis not present

## 2020-06-27 DIAGNOSIS — Z79899 Other long term (current) drug therapy: Secondary | ICD-10-CM | POA: Insufficient documentation

## 2020-06-27 DIAGNOSIS — Z87891 Personal history of nicotine dependence: Secondary | ICD-10-CM | POA: Insufficient documentation

## 2020-06-27 DIAGNOSIS — R079 Chest pain, unspecified: Secondary | ICD-10-CM

## 2020-06-27 DIAGNOSIS — M792 Neuralgia and neuritis, unspecified: Secondary | ICD-10-CM | POA: Insufficient documentation

## 2020-06-27 DIAGNOSIS — G5793 Unspecified mononeuropathy of bilateral lower limbs: Secondary | ICD-10-CM

## 2020-06-27 DIAGNOSIS — E039 Hypothyroidism, unspecified: Secondary | ICD-10-CM | POA: Diagnosis not present

## 2020-06-27 DIAGNOSIS — R072 Precordial pain: Secondary | ICD-10-CM | POA: Insufficient documentation

## 2020-06-27 LAB — BLOOD GAS, VENOUS
Acid-Base Excess: 10.7 mmol/L — ABNORMAL HIGH (ref 0.0–2.0)
Bicarbonate: 33.5 mmol/L — ABNORMAL HIGH (ref 20.0–28.0)
FIO2: 21
O2 Saturation: 76.3 %
Patient temperature: 36.7
pCO2, Ven: 43.5 mmHg — ABNORMAL LOW (ref 44.0–60.0)
pH, Ven: 7.51 — ABNORMAL HIGH (ref 7.250–7.430)
pO2, Ven: 38.4 mmHg (ref 32.0–45.0)

## 2020-06-27 LAB — CBC
HCT: 41.9 % (ref 36.0–46.0)
Hemoglobin: 14 g/dL (ref 12.0–15.0)
MCH: 31.8 pg (ref 26.0–34.0)
MCHC: 33.4 g/dL (ref 30.0–36.0)
MCV: 95.2 fL (ref 80.0–100.0)
Platelets: 249 10*3/uL (ref 150–400)
RBC: 4.4 MIL/uL (ref 3.87–5.11)
RDW: 14.1 % (ref 11.5–15.5)
WBC: 8.2 10*3/uL (ref 4.0–10.5)
nRBC: 0 % (ref 0.0–0.2)

## 2020-06-27 LAB — BASIC METABOLIC PANEL
Anion gap: 12 (ref 5–15)
BUN: 12 mg/dL (ref 6–20)
CO2: 31 mmol/L (ref 22–32)
Calcium: 10.3 mg/dL (ref 8.9–10.3)
Chloride: 92 mmol/L — ABNORMAL LOW (ref 98–111)
Creatinine, Ser: 0.46 mg/dL (ref 0.44–1.00)
GFR, Estimated: >60 mL/min (ref >60)
Glucose, Bld: 374 mg/dL — ABNORMAL HIGH (ref 70–99)
Potassium: 2.4 mmol/L — CL (ref 3.5–5.1)
Sodium: 135 mmol/L (ref 135–145)

## 2020-06-27 LAB — TROPONIN I (HIGH SENSITIVITY): Troponin I (High Sensitivity): 7 ng/L (ref <18)

## 2020-06-27 LAB — BETA-HYDROXYBUTYRIC ACID: Beta-Hydroxybutyric Acid: 0.13 mmol/L (ref 0.05–0.27)

## 2020-06-27 MED ORDER — POTASSIUM CHLORIDE 10 MEQ/100ML IV SOLN
10.0000 meq | INTRAVENOUS | Status: AC
  Administered 2020-06-28 (×3): 10 meq via INTRAVENOUS
  Filled 2020-06-27 (×3): qty 100

## 2020-06-27 MED ORDER — MORPHINE SULFATE (PF) 4 MG/ML IV SOLN
4.0000 mg | Freq: Once | INTRAVENOUS | Status: AC
Start: 2020-06-27 — End: 2020-06-27
  Administered 2020-06-27: 4 mg via INTRAVENOUS
  Filled 2020-06-27: qty 1

## 2020-06-27 MED ORDER — POTASSIUM CHLORIDE CRYS ER 20 MEQ PO TBCR
40.0000 meq | EXTENDED_RELEASE_TABLET | Freq: Once | ORAL | Status: AC
  Administered 2020-06-28: 40 meq via ORAL
  Filled 2020-06-27: qty 2

## 2020-06-27 NOTE — ED Provider Notes (Signed)
[facility]Y [facility]PARTMENT Provider Note   CSN: [id number] Arrival date & time: 06/27/20  2047     History Chief Complaint  Patient presents with  . Chest Pain    Autumn Daniels is a 60 y.o. female.  She is here by ambulance for evaluation of burning pain in both of her legs.  She said this is her neuropathy and it got really bad starting yesterday.  She is on gabapentin for this and she says it is not helping.  She also had some chest pain that started last night.  She says it sometimes makes her nauseous.  She has had this pain before but cannot tell me what it is caused by.  No known coronary disease.  She says her sugars usually run in the 3 and 400s even though she has been taking her insulin.  She is a smoker denies alcohol.  The history is provided by the patient.  Chest Pain Pain location:  Substernal area Pain quality: aching   Pain radiates to:  Does not radiate Pain severity:  Moderate Onset quality:  Gradual Duration:  24 hours Timing:  Intermittent Progression:  Unchanged Chronicity:  Recurrent Relieved by:  None tried Worsened by:  Nothing Ineffective treatments:  None tried Associated symptoms: nausea   Associated symptoms: no abdominal pain, no cough, no fever, no headache, no lower extremity edema, no shortness of breath and no vomiting   Risk factors: diabetes mellitus   Leg Pain Location:  Leg Time since incident:  24 hours Injury: no   Leg location:  R leg and L leg Pain details:    Quality:  Burning   Severity:  Moderate   Onset quality:  Gradual   Timing:  Intermittent   Progression:  Unchanged Chronicity:  Chronic Relieved by:  Nothing Worsened by:  Nothing Ineffective treatments: gabapentin. Associated symptoms: tingling   Associated symptoms: no fever, no neck pain and no swelling        Past Medical History:  Diagnosis Date  . Anemia   . Anxiety   . Depression   . Diabetes mellitus without complication (HCC)    type 1  .  Diarrhea    as of 12/2014 - pt states that she's had diarrhea for a year  . Fibromyalgia   . Forgetfulness 10/2016  . Frequent UTI    due to small urethra  . GERD (gastroesophageal reflux disease)   . Headache    hx of migraines  . History of hiatal hernia   . Lupus (HCC)   . Memory difficulty 10/2016  . Noncompliance   . Pneumonia   . Stevens-Johnson syndrome (HCC)   . Thyroid disease    has had radioactive iodine treatment for hyperthyroidism in 1990  . Vitamin D deficiency 07/07/2014    Patient Active Problem List   Diagnosis Date Noted  . Acute blood loss anemia 07/20/2014  . Vitamin D deficiency 07/07/2014  . Hypothyroidism following radioiodine therapy   . Type 1 diabetes mellitus, uncontrolled (HCC)   . GERD (gastroesophageal reflux disease)   . Lupus (HCC)   . Tibial plateau fracture 06/29/2014  . Closed fracture of thoracic vertebral body (HCC) 09/27/2013    Past Surgical History:  Procedure Laterality Date  . CHOLECYSTECTOMY     2 bifurcations of liver during lap chole  . EXTERNAL FIXATION LEG Right 06/30/2014   Procedure: EXTERNAL FIXATION LEG;  Surgeon: Vickki HearingStanley E Harrison, MD;  Location: [facility];  Service: Orthopedics;  Laterality: Right;  synthes large fragment exteranal fixation  c arm   . EXTERNAL FIXATION LEG Right 07/07/2014   Procedure: REVISION EXTERNAL FIXATION LEG;  Surgeon: Myrene Galas, MD;  Location: [facility] OR;  Service: Orthopedics;  Laterality: Right;  . EXTERNAL FIXATION REMOVAL Right 07/18/2014   Procedure: REMOVAL EXTERNAL FIXATION LEG;  Surgeon: Myrene Galas, MD;  Location: [facility] OR;  Service: Orthopedics;  Laterality: Right;  . HARDWARE REMOVAL Right 01/09/2015   Procedure: HARDWARE REMOVAL,RIGHT PROXIMAL TIBIAL;  Surgeon: Myrene Galas, MD;  Location: [facility] OR;  Service: Orthopedics;  Laterality: Right;  . KNEE ARTHROSCOPY Right 01/09/2015   Procedure: ARTHROSCOPY KNEE RIGHT ;  Surgeon: Myrene Galas, MD;  Location: [facility] OR;  Service: Orthopedics;   Laterality: Right;  . LIVER SURGERY     x4 due to bifurcations from lap chole  . ORIF TIBIA PLATEAU Right 07/18/2014   Procedure: RIGHT OPEN REDUCTION INTERNAL FIXATION (ORIF) TIBIA FRACTURE;  Surgeon: Myrene Galas, MD;  Location: [facility] OR;  Service: Orthopedics;  Laterality: Right;  ORIF of bicondylar plateau fracture  . STERIOD INJECTION Bilateral 01/09/2015   Procedure: RIGHT RING FINGER AND LEFT RING FINGER STEROID INJECTION;  Surgeon: Myrene Galas, MD;  Location: [facility] OR;  Service: Orthopedics;  Laterality: Bilateral;  . TONSILLECTOMY       OB History   No obstetric history on file.     Family History  Problem Relation Age of Onset  . Colon cancer Mother   . Lung disease Father   . Diabetes Father   . Spina bifida Sister   . Diabetes Brother     Social History   Tobacco Use  . Smoking status: Former Smoker    Packs/day: 0.50    Years: 30.00    Pack years: 15.00    Types: Cigarettes    Quit date: 05/29/2014    Years since quitting: 6.0  . Smokeless tobacco: Never Used  Substance Use Topics  . Alcohol use: No  . Drug use: No    Home Medications Prior to Admission medications   Medication Sig Start Date End Date Taking? Authorizing Provider  ciprofloxacin (CIPRO) 500 MG tablet Take 500 mg by mouth 2 (two) times daily. 7 day course starting on 05/03/2018 05/03/18   [provider]  gabapentin (NEURONTIN) 400 MG capsule Take 400 mg by mouth 3 (three) times daily.    [provider]  gabapentin (NEURONTIN) 400 MG capsule Take 1 capsule (400 mg total) by mouth 3 (three) times daily. 05/04/18   Sabas Sous, MD  HYDROcodone-acetaminophen (NORCO/VICODIN) 5-325 MG tablet One tablet every six hours for pain.  Limit 7 days. Patient taking differently: Take 0.5-1 tablets by mouth every 6 (six) hours as needed for moderate pain or severe pain.  11/24/17   Darreld Mclean, MD  Insulin Degludec (TRESIBA FLEXTOUCH) 200 UNIT/ML SOPN Inject 8 Units into the skin at  bedtime.     [provider]  Insulin Lispro (HUMALOG KWIKPEN) 200 UNIT/ML SOPN Inject 10 Units into the skin 3 (three) times daily with meals. Increase amount as directed per personal sliding scale instructions 06/13/15   [provider]  levothyroxine (SYNTHROID, LEVOTHROID) 137 MCG tablet Take 1 tablet by mouth every morning.  06/18/17   [provider]  LORazepam (ATIVAN) 0.5 MG tablet Take 0.5 mg by mouth every 8 (eight) hours as needed. for anxiety 06/23/17   [provider]  nicotine polacrilex (NICORETTE) 4 MG gum Take 4 mg by mouth as needed for smoking cessation.  [provider]  Potassium 99 MG TABS Take 1 tablet by mouth daily as needed (for leg cramping).    [provider]  rOPINIRole (REQUIP) 1 MG tablet Take 1 mg by mouth at bedtime.  04/18/16   [provider]  traMADol (ULTRAM) 50 MG tablet Take 100 mg by mouth every 8 (eight) hours as needed for moderate pain.     [provider]  VITAMIN D PO Take 1 capsule by mouth daily.    [provider]    Allergies    Penicillins and Sulfa antibiotics  Review of Systems   Review of Systems  Constitutional: Negative for fever.  HENT: Negative for sore throat.   Eyes: Negative for visual disturbance.  Respiratory: Negative for cough and shortness of breath.   Cardiovascular: Positive for chest pain.  Gastrointestinal: Positive for nausea. Negative for abdominal pain and vomiting.  Genitourinary: Negative for dysuria.  Musculoskeletal: Negative for neck pain.  Skin: Negative for rash.  Neurological: Negative for headaches.    Physical Exam Updated Vital Signs BP 127/87 (BP Location: Left Arm)   Pulse 92   Temp 98 F (36.7 C) (Oral)   Resp 16   Ht 4\' 10"  (1.473 m)   Wt 35.8 kg   SpO2 98%   BMI 16.50 kg/m   Physical Exam Vitals and nursing note reviewed.  Constitutional:      General: She is not in acute distress.    Appearance: Normal  appearance. She is well-developed and underweight.  HENT:     Head: Normocephalic and atraumatic.  Eyes:     Conjunctiva/sclera: Conjunctivae normal.  Cardiovascular:     Rate and Rhythm: Normal rate and regular rhythm.     Pulses: Normal pulses.     Heart sounds: No murmur heard.   Pulmonary:     Effort: Pulmonary effort is normal. No respiratory distress.     Breath sounds: Normal breath sounds.  Abdominal:     Palpations: Abdomen is soft.     Tenderness: There is no abdominal tenderness.  Musculoskeletal:        General: Tenderness (generalized) present. Normal range of motion.     Cervical back: Neck supple.     Right lower leg: No edema.     Left lower leg: No edema.     Comments: DP pulses strong bilaterally  Skin:    General: Skin is warm and dry.  Neurological:     General: No focal deficit present.     Mental Status: She is alert.     ED Results / Procedures / Treatments   Labs (all labs ordered are listed, but only abnormal results are displayed) Labs Reviewed  BASIC METABOLIC PANEL - Abnormal; Notable for the following components:      Result Value   Potassium 2.4 (*)    Chloride 92 (*)    Glucose, Bld 374 (*)    All other components within normal limits  BLOOD GAS, VENOUS - Abnormal; Notable for the following components:   pH, Ven 7.510 (*)    pCO2, Ven 43.5 (*)    Bicarbonate 33.5 (*)    Acid-Base Excess 10.7 (*)    All other components within normal limits  CBC  BETA-HYDROXYBUTYRIC ACID  MAGNESIUM  TROPONIN I (HIGH SENSITIVITY)  TROPONIN I (HIGH SENSITIVITY)    EKG EKG Interpretation  Date/Time:  Wednesday June 27 2020 20:57:51 EDT Ventricular Rate:  95 PR Interval:  146 QRS Duration: 78 QT Interval:  357  QTC Calculation: 449 R Axis:   -36 Text Interpretation: Sinus rhythm Left axis deviation Probable anterior infarct, age indeterminate No significant change since prior 8/21 Confirmed by Meridee Score [phone]) on 06/27/2020 9:09:14  PM   Radiology DG Chest 2 View  Result Date: 06/27/2020 CLINICAL DATA:  Substernal chest pain since last night; history lupus, former smoker, type I diabetes mellitus, fibromyalgia EXAM: CHEST - 2 VIEW COMPARISON:  05/04/2018 FINDINGS: Normal heart size, mediastinal contours, and pulmonary vascularity. Lungs hyperinflated question COPD or asthma. No acute infiltrate, pleural effusion, or pneumothorax. Osseous demineralization with compression fractures of adjacent lower thoracic vertebra, T11 and T12 by a prior CT, not significantly changed. IMPRESSION: Hyperinflated lungs question COPD or asthma. No acute abnormalities. Electronically Signed   By: Ulyses Southward M.D.   On: 06/27/2020 21:48    Procedures Procedures   Medications Ordered in ED Medications  morphine 4 MG/ML injection 4 mg (has no administration in time range)    ED Course  I have reviewed the triage vital signs and the nursing notes.  Pertinent labs & imaging results that were available during my care of the patient were reviewed by me and considered in my medical decision making (see chart for details).  Clinical Course as of 06/28/20 1201  Wed Jun 27, 2020  2158 Chest x-ray with evidence of COPD, no infiltrates.  Interpreted by me [MB]    Clinical Course User Index [MB] Terrilee Files, MD   MDM Rules/Calculators/A&P                         This patient complains of chest pain and burning leg pain bilateral lower extremities; this involves an extensive number of treatment Options and is a complaint that carries with it a high risk of complications and Morbidity. The differential includes peripheral neuropathy, DVT, thrombosis, ACS, GERD, metabolic derangement  I ordered labs and these are pending at time of signout to oncoming provider I ordered medication IV morphine for pain control I ordered imaging studies which included chest x-ray and I independently    visualized and interpreted imaging which showed no  acute findings Previous records obtained and reviewed in epic, patient has no neuropathic pain and is on gabapentin  After the interventions stated above, I reevaluated the patient and found patient symptoms to be moderately improved.  Her care is signed out to oncoming provider Dr. Preston Fleeting to follow-up on labs and delta troponin.  Disposition per results of testing.   Final Clinical Impression(s) / ED Diagnoses Final diagnoses:  Nonspecific chest pain  Neuropathic pain of both legs  Hypokalemia    Rx / DC Orders ED Discharge Orders    None       Terrilee Files, MD 06/28/20 1205

## 2020-06-27 NOTE — ED Notes (Signed)
Date and time results received: 06/27/20 2330  Test: potassium Critical Value: 2.4  Name of Provider Notified: Preston Fleeting, MD  Orders Received? Or Actions Taken?: acknowledged

## 2020-06-27 NOTE — ED Triage Notes (Signed)
Pt c/o substernal chest pain that started last night. And left leg pain. Pt cbg was over 400 for EMS, given about of normal saline. Pt hx of T1D.

## 2020-06-27 NOTE — ED Provider Notes (Signed)
Care assumed from Dr. Charm Barges, patient with worsening neuropathy pain and also chest pain.  ECG showing no acute changes, labs pending.  Labs are significant for potassium 2.4.  She has had significant hypokalemia in multiple occasions in the past.  She is given both oral and intravenous potassium.  Troponin is normal x2.  Magnesium is normal as well.  She is requesting something for her neuropathic pain.  She is given a dose of morphine.  Potassium runs are completed.  She is discharged with prescription for K-Dur and is referred to cardiology for outpatient work-up of her chest pain.  Referred back to her primary care provider for management of neuropathy pain.  CRITICAL CARE Performed by: Dione Booze Total critical care time: 45 minutes Critical care time was exclusive of separately billable procedures and treating other patients. Critical care was necessary to treat or prevent imminent or life-threatening deterioration. Critical care was time spent personally by me on the following activities: development of treatment plan with patient and/or surrogate as well as nursing, discussions with consultants, evaluation of patient's response to treatment, examination of patient, obtaining history from patient or surrogate, ordering and performing treatments and interventions, ordering and review of laboratory studies, ordering and review of radiographic studies, pulse oximetry and re-evaluation of patient's condition.  Results for orders placed or performed during the hospital encounter of 06/27/20  Basic metabolic panel  Result Value Ref Range   Sodium 135 135 - 145 mmol/L   Potassium 2.4 (LL) 3.5 - 5.1 mmol/L   Chloride 92 (L) 98 - 111 mmol/L   CO2 31 22 - 32 mmol/L   Glucose, Bld 374 (H) 70 - 99 mg/dL   BUN 12 6 - 20 mg/dL   Creatinine, Ser 5.05 0.44 - 1.00 mg/dL   Calcium 39.7 8.9 - 67.3 mg/dL   GFR, Estimated >41 >93 mL/min   Anion gap 12 5 - 15  CBC  Result Value Ref Range   WBC 8.2 4.0  - 10.5 K/uL   RBC 4.40 3.87 - 5.11 MIL/uL   Hemoglobin 14.0 12.0 - 15.0 g/dL   HCT 79.0 24.0 - 97.3 %   MCV 95.2 80.0 - 100.0 fL   MCH 31.8 26.0 - 34.0 pg   MCHC 33.4 30.0 - 36.0 g/dL   RDW 53.2 99.2 - 42.6 %   Platelets 249 150 - 400 K/uL   nRBC 0.0 0.0 - 0.2 %  Blood gas, venous  Result Value Ref Range   FIO2 21.00    pH, Ven 7.510 (H) 7.250 - 7.430   pCO2, Ven 43.5 (L) 44.0 - 60.0 mmHg   pO2, Ven 38.4 32.0 - 45.0 mmHg   Bicarbonate 33.5 (H) 20.0 - 28.0 mmol/L   Acid-Base Excess 10.7 (H) 0.0 - 2.0 mmol/L   O2 Saturation 76.3 %   Patient temperature 36.7   Beta-hydroxybutyric acid  Result Value Ref Range   Beta-Hydroxybutyric Acid 0.13 0.05 - 0.27 mmol/L  Magnesium  Result Value Ref Range   Magnesium 1.8 1.7 - 2.4 mg/dL  Troponin I (High Sensitivity)  Result Value Ref Range   Troponin I (High Sensitivity) 7 <18 ng/L  Troponin I (High Sensitivity)  Result Value Ref Range   Troponin I (High Sensitivity) 7 <18 ng/L   DG Chest 2 View  Result Date: 06/27/2020 CLINICAL DATA:  Substernal chest pain since last night; history lupus, former smoker, type I diabetes mellitus, fibromyalgia EXAM: CHEST - 2 VIEW COMPARISON:  05/04/2018 FINDINGS: Normal heart size, mediastinal contours,  and pulmonary vascularity. Lungs hyperinflated question COPD or asthma. No acute infiltrate, pleural effusion, or pneumothorax. Osseous demineralization with compression fractures of adjacent lower thoracic vertebra, T11 and T12 by a prior CT, not significantly changed. IMPRESSION: Hyperinflated lungs question COPD or asthma. No acute abnormalities. Electronically Signed   By: Ulyses Southward M.D.   On: 06/27/2020 21:48      Dione Booze, MD 06/28/20 [phone]

## 2020-06-28 LAB — MAGNESIUM: Magnesium: 1.8 mg/dL (ref 1.7–2.4)

## 2020-06-28 LAB — TROPONIN I (HIGH SENSITIVITY): Troponin I (High Sensitivity): 7 ng/L (ref <18)

## 2020-06-28 MED ORDER — POTASSIUM CHLORIDE CRYS ER 20 MEQ PO TBCR: 20.0000 meq | EXTENDED_RELEASE_TABLET | Freq: Two times a day (BID) | ORAL | 0 refills | Status: DC

## 2020-06-28 MED ORDER — MORPHINE SULFATE (PF) 4 MG/ML IV SOLN
4.0000 mg | Freq: Once | INTRAVENOUS | Status: AC
  Administered 2020-06-28: 4 mg via INTRAVENOUS
  Filled 2020-06-28: qty 1

## 2020-06-28 NOTE — Discharge Instructions (Addendum)
Continue to work with your primary care provider to get your neuropathy pain under control.

## 2020-06-28 NOTE — ED Notes (Signed)
Pt rec'd d/c papers., called for ride. Ambulatory to lobby

## 2021-01-02 ENCOUNTER — Other Ambulatory Visit: Payer: Self-pay

## 2021-01-02 ENCOUNTER — Emergency Department (HOSPITAL_COMMUNITY): Payer: Medicare PPO

## 2021-01-02 ENCOUNTER — Emergency Department (HOSPITAL_COMMUNITY)
Admission: EM | Admit: 2021-01-02 | Discharge: 2021-01-02 | Disposition: A | Discharge status: Home / Self Care | Payer: Medicare PPO | Attending: Emergency Medicine | Admitting: Emergency Medicine

## 2021-01-02 ENCOUNTER — Encounter (HOSPITAL_COMMUNITY): Payer: Self-pay | Admitting: Emergency Medicine

## 2021-01-02 DIAGNOSIS — Z794 Long term (current) use of insulin: Secondary | ICD-10-CM | POA: Diagnosis not present

## 2021-01-02 DIAGNOSIS — E109 Type 1 diabetes mellitus without complications: Secondary | ICD-10-CM | POA: Insufficient documentation

## 2021-01-02 DIAGNOSIS — Z79899 Other long term (current) drug therapy: Secondary | ICD-10-CM | POA: Insufficient documentation

## 2021-01-02 DIAGNOSIS — J209 Acute bronchitis, unspecified: Secondary | ICD-10-CM

## 2021-01-02 DIAGNOSIS — R0602 Shortness of breath: Secondary | ICD-10-CM | POA: Diagnosis present

## 2021-01-02 DIAGNOSIS — Z20822 Contact with and (suspected) exposure to covid-19: Secondary | ICD-10-CM | POA: Insufficient documentation

## 2021-01-02 DIAGNOSIS — Z87891 Personal history of nicotine dependence: Secondary | ICD-10-CM | POA: Insufficient documentation

## 2021-01-02 DIAGNOSIS — E039 Hypothyroidism, unspecified: Secondary | ICD-10-CM | POA: Diagnosis not present

## 2021-01-02 DIAGNOSIS — E876 Hypokalemia: Secondary | ICD-10-CM | POA: Insufficient documentation

## 2021-01-02 LAB — RESP PANEL BY RT-PCR (FLU A&B, COVID) ARPGX2
Influenza A by PCR: NEGATIVE
Influenza B by PCR: NEGATIVE
SARS Coronavirus 2 by RT PCR: NEGATIVE

## 2021-01-02 LAB — CBC WITH DIFFERENTIAL/PLATELET
Abs Immature Granulocytes: 0.05 10*3/uL (ref 0.00–0.07)
Basophils Absolute: 0.1 10*3/uL (ref 0.0–0.1)
Basophils Relative: 1 %
Eosinophils Absolute: 0.2 10*3/uL (ref 0.0–0.5)
Eosinophils Relative: 2 %
HCT: 46 % (ref 36.0–46.0)
Hemoglobin: 15.2 g/dL — ABNORMAL HIGH (ref 12.0–15.0)
Immature Granulocytes: 1 %
Lymphocytes Relative: 24 %
Lymphs Abs: 2.2 10*3/uL (ref 0.7–4.0)
MCH: 32 pg (ref 26.0–34.0)
MCHC: 33 g/dL (ref 30.0–36.0)
MCV: 96.8 fL (ref 80.0–100.0)
Monocytes Absolute: 0.8 10*3/uL (ref 0.1–1.0)
Monocytes Relative: 9 %
Neutro Abs: 5.9 10*3/uL (ref 1.7–7.7)
Neutrophils Relative %: 63 %
Platelets: 271 10*3/uL (ref 150–400)
RBC: 4.75 MIL/uL (ref 3.87–5.11)
RDW: 12.7 % (ref 11.5–15.5)
WBC: 9.2 10*3/uL (ref 4.0–10.5)
nRBC: 0 % (ref 0.0–0.2)

## 2021-01-02 LAB — BRAIN NATRIURETIC PEPTIDE: B Natriuretic Peptide: 32 pg/mL (ref 0.0–100.0)

## 2021-01-02 LAB — BASIC METABOLIC PANEL
Anion gap: 7 (ref 5–15)
BUN: 9 mg/dL (ref 6–20)
CO2: 36 mmol/L — ABNORMAL HIGH (ref 22–32)
Calcium: 9.2 mg/dL (ref 8.9–10.3)
Chloride: 97 mmol/L — ABNORMAL LOW (ref 98–111)
Creatinine, Ser: 0.39 mg/dL — ABNORMAL LOW (ref 0.44–1.00)
GFR, Estimated: >60 mL/min (ref >60)
Glucose, Bld: 107 mg/dL — ABNORMAL HIGH (ref 70–99)
Potassium: 2.3 mmol/L — CL (ref 3.5–5.1)
Sodium: 140 mmol/L (ref 135–145)

## 2021-01-02 LAB — D-DIMER, QUANTITATIVE: D-Dimer, Quant: 0.31 ug/mL-FEU (ref 0.00–0.50)

## 2021-01-02 LAB — CBG MONITORING, ED: Glucose-Capillary: 152 mg/dL — ABNORMAL HIGH (ref 70–99)

## 2021-01-02 LAB — MAGNESIUM: Magnesium: 1.8 mg/dL (ref 1.7–2.4)

## 2021-01-02 MED ORDER — POTASSIUM CHLORIDE CRYS ER 20 MEQ PO TBCR
40.0000 meq | EXTENDED_RELEASE_TABLET | Freq: Once | ORAL | Status: DC
  Filled 2021-01-02: qty 2

## 2021-01-02 MED ORDER — IPRATROPIUM-ALBUTEROL 0.5-2.5 (3) MG/3ML IN SOLN
3.0000 mL | Freq: Once | RESPIRATORY_TRACT | Status: AC
Start: 2021-01-02 — End: 2021-01-02
  Administered 2021-01-02: 3 mL via RESPIRATORY_TRACT
  Filled 2021-01-02: qty 3

## 2021-01-02 MED ORDER — POTASSIUM CHLORIDE CRYS ER 20 MEQ PO TBCR: 20.0000 meq | EXTENDED_RELEASE_TABLET | Freq: Three times a day (TID) | ORAL | 0 refills | Status: DC

## 2021-01-02 MED ORDER — POTASSIUM CHLORIDE CRYS ER 20 MEQ PO TBCR
40.0000 meq | EXTENDED_RELEASE_TABLET | Freq: Once | ORAL | Status: AC
  Administered 2021-01-02: 40 meq via ORAL
  Filled 2021-01-02: qty 2

## 2021-01-02 MED ORDER — ALBUTEROL SULFATE HFA 108 (90 BASE) MCG/ACT IN AERS: 2.0000 | INHALATION_SPRAY | RESPIRATORY_TRACT | 0 refills | Status: DC | PRN

## 2021-01-02 MED ORDER — POTASSIUM CHLORIDE 10 MEQ/100ML IV SOLN
10.0000 meq | INTRAVENOUS | Status: AC
  Administered 2021-01-02 (×3): 10 meq via INTRAVENOUS
  Filled 2021-01-02 (×3): qty 100

## 2021-01-02 NOTE — Progress Notes (Signed)
Pt spo2 88-89% before treatment increased to 96% with treatment RT placed pt on 2lpm cann after treatment nurse informed

## 2021-01-02 NOTE — Discharge Instructions (Signed)
Your potassium has been very low the last several times it has been checked.  You have been given a prescription for potassium.  It is important that you take it as prescribed.  Please follow-up with your primary care provider in 1 week to recheck the potassium.  It is possible that she may need a higher dose of potassium at home.  Please discuss with your primary care provider whether referral to a nephrologist (kidney specialist) would be helpful to try to find out why your potassium stays so low.

## 2021-01-02 NOTE — ED Triage Notes (Signed)
Pt c/o sob since yesterday.  

## 2021-01-02 NOTE — ED Provider Notes (Signed)
Signout from Dr. Preston Fleeting.  60 year old female here with shortness of breath.  Found to be hypokalemia.  Getting IV potassium.  Plan is for discharge after IV potassium runs completed. Physical Exam  BP (!) 151/75   Pulse 86   Temp 98.7 F (37.1 C) (Oral)   Resp 13   Ht 4\' 11"  (1.499 m)   Wt 34.5 kg   SpO2 99%   BMI 15.35 kg/m   Physical Exam  ED Course/Procedures     Procedures  MDM  Oral and IV potassium given.  Patient otherwise asymptomatic.  Discharge per prior provider plan       , MD 01/02/21 [phone]

## 2021-01-02 NOTE — ED Provider Notes (Signed)
[facility] EMERGENCY DEPARTMENT Provider Note   CSN: [id number] Arrival date & time: 01/02/21  0325     History Chief Complaint  Patient presents with   Shortness of Breath    Autumn Daniels is a 60 y.o. female.  The history is provided by the patient.  Shortness of Breath She has history of diabetes, lupus and comes in with difficulty breathing that she noticed tonight.  She has been sick for the last week with a cough which is mostly nonproductive.  She frequently gets bronchitis in the fall and this is what it usually feels like.  She denies fever, chills, sweats.  She has had some chest soreness which she relates to her coughing.  She denies any nausea or vomiting.  Denied at about 9 PM, she noted she got very short of breath.  She also states that she felt like her blood sugar was very high earlier today and she took extra insulin and states it feels like it went down.  She denies any sick contacts.  She has been vaccinated against COVID-19 including 2 boosters.   Past Medical History:  Diagnosis Date   Anemia    Anxiety    Depression    Diabetes mellitus without complication (HCC)    type 1   Diarrhea    as of 12/2014 - pt states that she's had diarrhea for a year   Fibromyalgia    Forgetfulness 10/2016   Frequent UTI    due to small urethra   GERD (gastroesophageal reflux disease)    Headache    hx of migraines   History of hiatal hernia    Lupus (HCC)    Memory difficulty 10/2016   Noncompliance    Pneumonia    Stevens-Johnson syndrome (HCC)    Thyroid disease    has had radioactive iodine treatment for hyperthyroidism in 1990   Vitamin D deficiency 07/07/2014    Patient Active Problem List   Diagnosis Date Noted   Acute blood loss anemia 07/20/2014   Vitamin D deficiency 07/07/2014   Hypothyroidism following radioiodine therapy    Type 1 diabetes mellitus, uncontrolled (HCC)    GERD (gastroesophageal reflux disease)    Lupus (HCC)    Tibial plateau  fracture 06/29/2014   Closed fracture of thoracic vertebral body (HCC) 09/27/2013    Past Surgical History:  Procedure Laterality Date   CHOLECYSTECTOMY     2 bifurcations of liver during lap chole   EXTERNAL FIXATION LEG Right 06/30/2014   Procedure: EXTERNAL FIXATION LEG;  Surgeon: Vickki Hearing, MD;  Location: [facility];  Service: Orthopedics;  Laterality: Right;  synthes large fragment exteranal fixation  c arm    EXTERNAL FIXATION LEG Right 07/07/2014   Procedure: REVISION EXTERNAL FIXATION LEG;  Surgeon: Myrene Galas, MD;  Location: [facility] OR;  Service: Orthopedics;  Laterality: Right;   EXTERNAL FIXATION REMOVAL Right 07/18/2014   Procedure: REMOVAL EXTERNAL FIXATION LEG;  Surgeon: Myrene Galas, MD;  Location: [facility] OR;  Service: Orthopedics;  Laterality: Right;   HARDWARE REMOVAL Right 01/09/2015   Procedure: HARDWARE REMOVAL,RIGHT PROXIMAL TIBIAL;  Surgeon: Myrene Galas, MD;  Location: [facility] OR;  Service: Orthopedics;  Laterality: Right;   KNEE ARTHROSCOPY Right 01/09/2015   Procedure: ARTHROSCOPY KNEE RIGHT ;  Surgeon: Myrene Galas, MD;  Location: [facility] OR;  Service: Orthopedics;  Laterality: Right;   LIVER SURGERY     x4 due to bifurcations from lap chole   ORIF TIBIA PLATEAU Right 07/18/2014   Procedure: RIGHT  OPEN REDUCTION INTERNAL FIXATION (ORIF) TIBIA FRACTURE;  Surgeon: Myrene Galas, MD;  Location: [facility] OR;  Service: Orthopedics;  Laterality: Right;  ORIF of bicondylar plateau fracture   STERIOD INJECTION Bilateral 01/09/2015   Procedure: RIGHT RING FINGER AND LEFT RING FINGER STEROID INJECTION;  Surgeon: Myrene Galas, MD;  Location: [facility] OR;  Service: Orthopedics;  Laterality: Bilateral;   TONSILLECTOMY       OB History   No obstetric history on file.     Family History  Problem Relation Age of Onset   Colon cancer Mother    Lung disease Father    Diabetes Father    Spina bifida Sister    Diabetes Brother     Social History   Tobacco Use   Smoking status: Former     Packs/day: 0.50    Years: 30.00    Pack years: 15.00    Types: Cigarettes    Quit date: 05/29/2014    Years since quitting: 6.6   Smokeless tobacco: Never  Substance Use Topics   Alcohol use: No   Drug use: No    Home Medications Prior to Admission medications   Medication Sig Start Date End Date Taking? Authorizing Provider  gabapentin (NEURONTIN) 400 MG capsule Take 400 mg by mouth 3 (three) times daily.    [provider]  gabapentin (NEURONTIN) 400 MG capsule Take 1 capsule (400 mg total) by mouth 3 (three) times daily. 05/04/18   Sabas Sous, MD  HYDROcodone-acetaminophen (NORCO/VICODIN) 5-325 MG tablet One tablet every six hours for pain.  Limit 7 days. Patient taking differently: Take 0.5-1 tablets by mouth every 6 (six) hours as needed for moderate pain or severe pain.  11/24/17   Darreld Mclean, MD  Insulin Degludec (TRESIBA FLEXTOUCH) 200 UNIT/ML SOPN Inject 8 Units into the skin at bedtime.     [provider]  Insulin Lispro (HUMALOG KWIKPEN) 200 UNIT/ML SOPN Inject 10 Units into the skin 3 (three) times daily with meals. Increase amount as directed per personal sliding scale instructions 06/13/15   [provider]  levothyroxine (SYNTHROID, LEVOTHROID) 137 MCG tablet Take 1 tablet by mouth every morning.  06/18/17   [provider]  LORazepam (ATIVAN) 0.5 MG tablet Take 0.5 mg by mouth every 8 (eight) hours as needed. for anxiety 06/23/17   [provider]  nicotine polacrilex (NICORETTE) 4 MG gum Take 4 mg by mouth as needed for smoking cessation.    [provider]  potassium chloride SA (KLOR-CON) 20 MEQ tablet Take 1 tablet (20 mEq total) by mouth 2 (two) times daily. 06/28/20   Dione Booze, MD  rOPINIRole (REQUIP) 1 MG tablet Take 1 mg by mouth at bedtime.  04/18/16   [provider]  traMADol (ULTRAM) 50 MG tablet Take 100 mg by mouth every 8 (eight) hours as needed for moderate pain.     [provider]   VITAMIN D PO Take 1 capsule by mouth daily.    [provider]  Potassium 99 MG TABS Take 1 tablet by mouth daily as needed (for leg cramping).  06/28/20  [provider]    Allergies    Penicillins and Sulfa antibiotics  Review of Systems   Review of Systems  Respiratory:  Positive for shortness of breath.   All other systems reviewed and are negative.  Physical Exam Updated Vital Signs BP (!) 173/94   Pulse 93   Temp 98.7 F (37.1 C) (Oral)   Resp (!) 22  Ht 4\' 11"  (1.499 m)   Wt 34.5 kg   SpO2 95%   BMI 15.35 kg/m   Physical Exam Vitals and nursing note reviewed.  60 year old female, resting comfortably and in no acute distress. Vital signs are significant for elevated blood pressure and respiratory rate. Oxygen saturation is 95%, which is normal. Head is normocephalic and atraumatic. PERRLA, EOMI. Oropharynx is clear. Neck is nontender and supple without adenopathy or JVD. Back is nontender and there is no CVA tenderness. Lungs have diminished airflow diffusely with slightly prolonged exhalation phase and a few faint wheezes in the upper lobes.  No definite rales or rhonchi appreciated. Chest is nontender. Heart has regular rate and rhythm without murmur. Abdomen is soft, flat, nontender without masses or hepatosplenomegaly and peristalsis is normoactive. Extremities have no cyanosis or edema, full range of motion is present. Skin is warm and dry without rash. Neurologic: Mental status is normal, cranial nerves are intact, there are no motor or sensory deficits.  ED Results / Procedures / Treatments   Labs (all labs ordered are listed, but only abnormal results are displayed) Labs Reviewed  CBG MONITORING, ED - Abnormal; Notable for the following components:      Result Value   Glucose-Capillary 152 (*)    All other components within normal limits  RESP PANEL BY RT-PCR (FLU A&B, COVID) ARPGX2  CBC WITH DIFFERENTIAL/PLATELET  BRAIN NATRIURETIC  PEPTIDE  BASIC METABOLIC PANEL  D-DIMER, QUANTITATIVE    EKG EKG Interpretation  Date/Time:  Wednesday January 02 2021 03:49:46 EDT Ventricular Rate:  96 PR Interval:  148 QRS Duration: 77 QT Interval:  366 QTC Calculation: 463 R Axis:   55 Text Interpretation: Sinus rhythm Nonspecific T abnrm, anterolateral leads Baseline wander in lead(s) II III aVF V2 V3 When compared with ECG of 06/27/2020, No significant change was found Confirmed by 08/27/2020 (Dione Booze) on 01/02/2021 3:57:25 AM  Radiology No results found.  Procedures Procedures   Medications Ordered in ED Medications  ipratropium-albuterol (DUONEB) 0.5-2.5 (3) MG/3ML nebulizer solution 3 mL (has no administration in time range)    ED Course  I have reviewed the triage vital signs and the nursing notes.  Pertinent labs & imaging results that were available during my care of the patient were reviewed by me and considered in my medical decision making (see chart for details).    MDM Rules/Calculators/A&P                         Cough and dyspnea suspicious for bronchitis, rule out pneumonia.  However, sudden onset of dyspnea does not make me concerned about possibility of pulmonary embolism.  Old records are reviewed, and she has no relevant past visits.  She will be given a therapeutic trial of albuterol with ipratropium, will check chest x-ray as well as screening labs including D-dimer and BNP.  D-dimer and BNP are normal.  She feels significantly better following albuterol with ipratropium.  Because of her labile diabetes, steroids were not given.  Labs do show severe hypokalemia with potassium 3.3.  She is given oral and intravenous potassium.  On review of old records again, it is noted that she has had several potassiums less than 3.0.  She states that she is only taking potassium once or twice a week.  She will be discharged with prescriptions for albuterol and K-Dur and I have recommended that she have her potassium  rechecked in the next week, consider nephrology referral  to try to find out why she stays so severely hypokalemic.  Final Clinical Impression(s) / ED Diagnoses Final diagnoses:  Acute bronchitis, unspecified organism  Hypokalemia    Rx / DC Orders ED Discharge Orders     None        Dione Booze, MD 01/02/21 [phone]

## 2021-02-11 ENCOUNTER — Emergency Department (HOSPITAL_COMMUNITY): Payer: Medicare PPO

## 2021-02-11 ENCOUNTER — Encounter (HOSPITAL_COMMUNITY): Payer: Self-pay

## 2021-02-11 ENCOUNTER — Other Ambulatory Visit: Payer: Self-pay

## 2021-02-11 ENCOUNTER — Observation Stay (HOSPITAL_COMMUNITY)
Admission: EM | Admit: 2021-02-11 | Discharge: 2021-02-11 | Disposition: A | Discharge status: Home / Self Care | Payer: Medicare PPO | Attending: Family Medicine | Admitting: Family Medicine

## 2021-02-11 DIAGNOSIS — Z79899 Other long term (current) drug therapy: Secondary | ICD-10-CM | POA: Diagnosis not present

## 2021-02-11 DIAGNOSIS — E1065 Type 1 diabetes mellitus with hyperglycemia: Secondary | ICD-10-CM | POA: Diagnosis not present

## 2021-02-11 DIAGNOSIS — Z5329 Procedure and treatment not carried out because of patient's decision for other reasons: Secondary | ICD-10-CM | POA: Diagnosis not present

## 2021-02-11 DIAGNOSIS — Z87891 Personal history of nicotine dependence: Secondary | ICD-10-CM | POA: Diagnosis not present

## 2021-02-11 DIAGNOSIS — Z5989 Other problems related to housing and economic circumstances: Secondary | ICD-10-CM | POA: Insufficient documentation

## 2021-02-11 DIAGNOSIS — E860 Dehydration: Secondary | ICD-10-CM | POA: Insufficient documentation

## 2021-02-11 DIAGNOSIS — T68XXXA Hypothermia, initial encounter: Principal | ICD-10-CM | POA: Insufficient documentation

## 2021-02-11 DIAGNOSIS — X31XXXA Exposure to excessive natural cold, initial encounter: Secondary | ICD-10-CM | POA: Insufficient documentation

## 2021-02-11 DIAGNOSIS — E876 Hypokalemia: Secondary | ICD-10-CM | POA: Insufficient documentation

## 2021-02-11 DIAGNOSIS — E1069 Type 1 diabetes mellitus with other specified complication: Secondary | ICD-10-CM | POA: Insufficient documentation

## 2021-02-11 DIAGNOSIS — E890 Postprocedural hypothyroidism: Secondary | ICD-10-CM | POA: Insufficient documentation

## 2021-02-11 DIAGNOSIS — E872 Acidosis, unspecified: Secondary | ICD-10-CM | POA: Diagnosis not present

## 2021-02-11 DIAGNOSIS — Z794 Long term (current) use of insulin: Secondary | ICD-10-CM | POA: Diagnosis not present

## 2021-02-11 LAB — MAGNESIUM: Magnesium: 1.9 mg/dL (ref 1.7–2.4)

## 2021-02-11 LAB — CBC WITH DIFFERENTIAL/PLATELET
Abs Immature Granulocytes: 0.03 10*3/uL (ref 0.00–0.07)
Basophils Absolute: 0.1 10*3/uL (ref 0.0–0.1)
Basophils Relative: 1 %
Eosinophils Absolute: 0.1 10*3/uL (ref 0.0–0.5)
Eosinophils Relative: 2 %
HCT: 49.1 % — ABNORMAL HIGH (ref 36.0–46.0)
Hemoglobin: 16.2 g/dL — ABNORMAL HIGH (ref 12.0–15.0)
Immature Granulocytes: 0 %
Lymphocytes Relative: 28 %
Lymphs Abs: 2.5 10*3/uL (ref 0.7–4.0)
MCH: 31.2 pg (ref 26.0–34.0)
MCHC: 33 g/dL (ref 30.0–36.0)
MCV: 94.6 fL (ref 80.0–100.0)
Monocytes Absolute: 0.6 10*3/uL (ref 0.1–1.0)
Monocytes Relative: 7 %
Neutro Abs: 5.6 10*3/uL (ref 1.7–7.7)
Neutrophils Relative %: 62 %
Platelets: 288 10*3/uL (ref 150–400)
RBC: 5.19 MIL/uL — ABNORMAL HIGH (ref 3.87–5.11)
RDW: 13.5 % (ref 11.5–15.5)
WBC: 8.9 10*3/uL (ref 4.0–10.5)
nRBC: 0 % (ref 0.0–0.2)

## 2021-02-11 LAB — LACTIC ACID, PLASMA
Lactic Acid, Venous: 2.9 mmol/L (ref 0.5–1.9)
Lactic Acid, Venous: 1.6 mmol/L (ref 0.5–1.9)

## 2021-02-11 LAB — COMPREHENSIVE METABOLIC PANEL
ALT: 12 U/L (ref 0–44)
AST: 17 U/L (ref 15–41)
Albumin: 4.1 g/dL (ref 3.5–5.0)
Alkaline Phosphatase: 91 U/L (ref 38–126)
Anion gap: 11 (ref 5–15)
BUN: 14 mg/dL (ref 6–20)
CO2: 34 mmol/L — ABNORMAL HIGH (ref 22–32)
Calcium: 9.7 mg/dL (ref 8.9–10.3)
Chloride: 96 mmol/L — ABNORMAL LOW (ref 98–111)
Creatinine, Ser: 0.34 mg/dL — ABNORMAL LOW (ref 0.44–1.00)
GFR, Estimated: >60 mL/min (ref >60)
Glucose, Bld: 66 mg/dL — ABNORMAL LOW (ref 70–99)
Potassium: 2.4 mmol/L — CL (ref 3.5–5.1)
Sodium: 141 mmol/L (ref 135–145)
Total Bilirubin: 0.4 mg/dL (ref 0.3–1.2)
Total Protein: 7.5 g/dL (ref 6.5–8.1)

## 2021-02-11 LAB — PROTIME-INR
INR: 0.8 (ref 0.8–1.2)
Prothrombin Time: 11.1 seconds — ABNORMAL LOW (ref 11.4–15.2)

## 2021-02-11 LAB — CBG MONITORING, ED
Glucose-Capillary: 71 mg/dL (ref 70–99)
Glucose-Capillary: 93 mg/dL (ref 70–99)
Glucose-Capillary: 101 mg/dL — ABNORMAL HIGH (ref 70–99)

## 2021-02-11 LAB — HEMOGLOBIN A1C
Hgb A1c MFr Bld: 10.9 % — ABNORMAL HIGH (ref 4.8–5.6)
Mean Plasma Glucose: 266.13 mg/dL

## 2021-02-11 LAB — APTT: aPTT: 26 seconds (ref 24–36)

## 2021-02-11 MED ORDER — VANCOMYCIN HCL 1000 MG/200ML IV SOLN: 1000.0000 mg | Freq: Once | INTRAVENOUS | Status: DC

## 2021-02-11 MED ORDER — POTASSIUM CHLORIDE IN NACL 20-0.9 MEQ/L-% IV SOLN
INTRAVENOUS | Status: DC
  Filled 2021-02-11: qty 1000

## 2021-02-11 MED ORDER — METRONIDAZOLE 500 MG/100ML IV SOLN
500.0000 mg | Freq: Once | INTRAVENOUS | Status: AC
  Administered 2021-02-11: 500 mg via INTRAVENOUS
  Filled 2021-02-11: qty 100

## 2021-02-11 MED ORDER — POTASSIUM CHLORIDE 10 MEQ/100ML IV SOLN
10.0000 meq | INTRAVENOUS | Status: DC
  Administered 2021-02-11 (×2): 10 meq via INTRAVENOUS
  Filled 2021-02-11 (×2): qty 100

## 2021-02-11 MED ORDER — LACTATED RINGERS IV SOLN: INTRAVENOUS | Status: DC

## 2021-02-11 MED ORDER — METHOCARBAMOL 500 MG PO TABS
750.0000 mg | ORAL_TABLET | Freq: Once | ORAL | Status: AC
  Administered 2021-02-11: 750 mg via ORAL
  Filled 2021-02-11: qty 2

## 2021-02-11 MED ORDER — KETOROLAC TROMETHAMINE 30 MG/ML IJ SOLN
30.0000 mg | Freq: Once | INTRAMUSCULAR | Status: AC
  Administered 2021-02-11: 30 mg via INTRAVENOUS
  Filled 2021-02-11: qty 1

## 2021-02-11 MED ORDER — SODIUM CHLORIDE 0.9 % IV SOLN
1.0000 g | Freq: Three times a day (TID) | INTRAVENOUS | Status: DC
  Filled 2021-02-11 (×4): qty 1

## 2021-02-11 MED ORDER — VANCOMYCIN HCL 500 MG/100ML IV SOLN
500.0000 mg | INTRAVENOUS | Status: DC
  Filled 2021-02-11 (×2): qty 100

## 2021-02-11 MED ORDER — POTASSIUM CHLORIDE CRYS ER 20 MEQ PO TBCR
40.0000 meq | EXTENDED_RELEASE_TABLET | ORAL | Status: DC
  Administered 2021-02-11: 40 meq via ORAL
  Filled 2021-02-11: qty 2

## 2021-02-11 MED ORDER — INSULIN ASPART 100 UNIT/ML IJ SOLN
0.0000 [IU] | Freq: Three times a day (TID) | INTRAMUSCULAR | Status: DC
Start: 2021-02-11 — End: 2021-02-11

## 2021-02-11 MED ORDER — SODIUM CHLORIDE 0.9 % IV SOLN
2.0000 g | Freq: Once | INTRAVENOUS | Status: AC
  Administered 2021-02-11: 2 g via INTRAVENOUS
  Filled 2021-02-11: qty 2

## 2021-02-11 MED ORDER — INSULIN ASPART 100 UNIT/ML IJ SOLN: 2.0000 [IU] | Freq: Three times a day (TID) | INTRAMUSCULAR | Status: DC

## 2021-02-11 MED ORDER — INSULIN ASPART 100 UNIT/ML IJ SOLN
0.0000 [IU] | Freq: Every day | INTRAMUSCULAR | Status: DC
Start: 2021-02-11 — End: 2021-02-11

## 2021-02-11 MED ORDER — DEXTROSE 50 % IV SOLN
25.0000 mL | Freq: Once | INTRAVENOUS | Status: AC
  Administered 2021-02-11: 25 mL via INTRAVENOUS
  Filled 2021-02-11: qty 50

## 2021-02-11 NOTE — TOC Progression Note (Signed)
TOC made aware by MD that pt left AMA. MD informed TOC that when pt was brought in by EMS this AM, the EMS staff informed ED staff of pt's home conditions being unsanitary and it also appeared patient was eating food that was past its expiration date. EMS apparently indicated their intention to make an APS report.  Per MD, pt with likely sepsis. Staff attempted to convince pt to stay for treatment but she ultimately refused.   This LCSW contacted APS to report and update on pt leaving AMA.

## 2021-02-11 NOTE — ED Notes (Signed)
Pt. Refused cbg stated she is leaving

## 2021-02-11 NOTE — Progress Notes (Signed)
Pharmacy Antibiotic Note  Autumn Daniels is a 60 y.o. female admitted on 02/11/2021 with sepsis.  Pharmacy has been consulted for Vancomycin/Aztreonam dosing. Pt is markedly hypothermic. WBC WNL. Renal function ok.   Plan: Vancomycin 500 mg IV q24h >>>Estimated AUC: 497 Aztreonam 1g IV q8h Trend WBC, temp, renal function  F/U infectious work-up Drug levels as indicated   Height: 4\' 11"  (149.9 cm) Weight: 35.6 kg (78 lb 9.5 oz) IBW/kg (Calculated) : 43.2  Temp (24hrs), Avg:91.8 F (33.2 C), Min:91.8 F (33.2 C), Max:91.8 F (33.2 C)  Recent Labs  Lab 02/11/21 0553  WBC 8.9  CREATININE 0.34*  LATICACIDVEN 2.9*    Estimated Creatinine Clearance: 42 mL/min (A) (by C-G formula based on SCr of 0.34 mg/dL (L)).    Allergies  Allergen Reactions   Penicillins Other (See Comments)    Did it involve swelling of the face/tongue/throat, SOB, or low BP? Yes Did it involve sudden or severe rash/hives, skin peeling, or any reaction on the inside of your mouth or nose? Yes Did you need to seek medical attention at a hospital or doctor's office? Yes When did it last happen?      60 years old If all above answers are "NO", may proceed with cephalosporin use.     Patient has SJS which prevents taking this medications   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   10, PharmD, BCPS Clinical Pharmacist Phone: [phone]

## 2021-02-11 NOTE — Consult Note (Signed)
Patient Demographics:    Autumn Daniels, is a 60 y.o. female  MRN: [id number]   DOB - 02-24-1961  Admit Date - 02/11/2021  Outpatient Primary MD for the patient is Jettie Booze, NP   Assessment & Plan:    Principal Problem:   Hypokalemia Active Problems:   Type 1 diabetes mellitus with other specified complication ([place])   Hypothyroidism following radioiodine therapy    1)Hypokalemia with generalized weakness and dehydration---  -Potassium is down to 2.4, EKG sinus rhythm with prolonged QT patient received IV and p.o. potassium --Magnesium WNL -Despite persuasion to stay for additional potassium replacement and IV fluids patient left AMA  2)Hypothermia and lactic acidosis----suspect this was environmental, per EMS team patient's house was cold --Lactic acid is down to 1.6 from 2.9 after hydration--- suspect lactic acid elevation due to dehydration rather than frank sepsis  3)Social/Ethics--- pt was brought in by EMS this AM, the EMS staff informed ED staff of pt's home conditions being unsanitary and it also appeared patient was eating food that was past its expiration date. EMS apparently indicated their intention to make an APS report. -Pt walked out of the hospital. Security officer saw her with IVs in her arm and security officer brought her back inside so we could take out . But she did leave AMA -Physicist, medical team trying to update APS and get social work department involved -I have placed orders for [place], PT, Aide and Social worker  4)Generalized Weakness and Deconditioning---- due to dehydration/severe hypokalemia  and hypothermia -Patient denies suicidal ideation or plan  5)Uncontrolled Type 1 Diabetes--- repeat A1c pending resume diabetic regimen/insulin -Carb consistent diet  advised- - follow-up with PCP for adjustments advised  6)Hypothyroidism--- recheck TSH as outpatient with PCP  Disposition--Despite persuasion to stay for additional potassium replacement and IV fluids patient left AMA  Dispo: The patient is from: Home              Anticipated d/c is to: Home    With History of - Reviewed by me  Past Medical History:  Diagnosis Date   Anemia    Anxiety    Depression    Diabetes mellitus without complication ([place])    type 1   Diarrhea    as of 12/2014 - pt states that she's had diarrhea for a year   Fibromyalgia    Forgetfulness 10/2016   Frequent UTI    due to small urethra   GERD (gastroesophageal reflux disease)    Headache    hx of migraines   History of hiatal hernia    Lupus ([place])    Memory difficulty 10/2016   Noncompliance    Pneumonia    Stevens-Johnson syndrome ([facility])    Thyroid disease    has had radioactive iodine treatment for hyperthyroidism in 1990   Vitamin D deficiency 07/07/2014      Past Surgical History:  Procedure Laterality Date   CHOLECYSTECTOMY  2 bifurcations of liver during lap chole   EXTERNAL FIXATION LEG Right 06/30/2014   Procedure: EXTERNAL FIXATION LEG;  Surgeon: Carole Civil, MD;  Location: [facility];  Service: Orthopedics;  Laterality: Right;  synthes large fragment exteranal fixation  c arm    EXTERNAL FIXATION LEG Right 07/07/2014   Procedure: REVISION EXTERNAL FIXATION LEG;  Surgeon: Altamese Frackville, MD;  Location: [place];  Service: Orthopedics;  Laterality: Right;   EXTERNAL FIXATION REMOVAL Right 07/18/2014   Procedure: REMOVAL EXTERNAL FIXATION LEG;  Surgeon: Altamese Burket, MD;  Location: [place];  Service: Orthopedics;  Laterality: Right;   HARDWARE REMOVAL Right 01/09/2015   Procedure: HARDWARE REMOVAL,RIGHT PROXIMAL TIBIAL;  Surgeon: Altamese [place], MD;  Location: [place];  Service: Orthopedics;  Laterality: Right;   KNEE ARTHROSCOPY Right 01/09/2015   Procedure: ARTHROSCOPY KNEE RIGHT ;   Surgeon: Altamese [place], MD;  Location: [place];  Service: Orthopedics;  Laterality: Right;   LIVER SURGERY     x4 due to bifurcations from lap chole   ORIF TIBIA PLATEAU Right 07/18/2014   Procedure: RIGHT OPEN REDUCTION INTERNAL FIXATION (ORIF) TIBIA FRACTURE;  Surgeon: Altamese Wheaton, MD;  Location: [place];  Service: Orthopedics;  Laterality: Right;  ORIF of bicondylar plateau fracture   STERIOD INJECTION Bilateral 01/09/2015   Procedure: RIGHT RING FINGER AND LEFT RING FINGER STEROID INJECTION;  Surgeon: Altamese [place] Veteranos II, MD;  Location: [facility];  Service: Orthopedics;  Laterality: Bilateral;   TONSILLECTOMY      Chief Complaint  Patient presents with   Dehydration      HPI:    Autumn Daniels  is a 60 y.o. female with past medical history relevant for uncontrolled type 1 diabetes, hypothyroidism who is brought in by EMS this AM, the EMS staff informed ED staff of pt's home conditions being unsanitary and it also appeared patient was eating food that was past its expiration date. EMS apparently indicated their intention to make an APS report. No fever  Or chills ,  No Nausea, Vomiting or Diarrhea ,  --no chest pains or palpitations no dizziness -Patient did complain of generalized weakness and deconditioning -No headaches, no visual disturbance  In in the ED patient is found to have hypokalemia and elevated lactic acid which improved after IV fluids  -Despite persuasion to stay for additional potassium replacement and IV fluids patient left AMA    Review of systems:    In addition to the HPI above,   A full Review of  Systems was done, all other systems reviewed are negative except as noted above in HPI , .    Social History:  Reviewed by me    Social History   Tobacco Use   Smoking status: Former    Packs/day: 0.50    Years: 30.00    Pack years: 15.00    Types: Cigarettes    Quit date: 05/29/2014    Years since quitting: 6.7   Smokeless tobacco: Never  Substance Use Topics    Alcohol use: No    Family History :  Reviewed by me    Family History  Problem Relation Age of Onset   Colon cancer Mother    Lung disease Father    Diabetes Father    Spina bifida Sister    Diabetes Brother      Home Medications:   Prior to Admission medications   Medication Sig Start Date End Date Taking? Authorizing Provider  albuterol (VENTOLIN HFA) 108 (90 Base) MCG/ACT inhaler Inhale 2  puffs into the lungs every 4 (four) hours as needed for wheezing or shortness of breath (or coughing). 01/02/21   Dione Booze, MD  gabapentin (NEURONTIN) 400 MG capsule Take 400 mg by mouth 3 (three) times daily.    [provider]  gabapentin (NEURONTIN) 400 MG capsule Take 1 capsule (400 mg total) by mouth 3 (three) times daily. 05/04/18   Sabas Sous, MD  HYDROcodone-acetaminophen (NORCO/VICODIN) 5-325 MG tablet One tablet every six hours for pain.  Limit 7 days. Patient taking differently: Take 0.5-1 tablets by mouth every 6 (six) hours as needed for moderate pain or severe pain.  11/24/17   Darreld Mclean, MD  Insulin Degludec (TRESIBA FLEXTOUCH) 200 UNIT/ML SOPN Inject 8 Units into the skin at bedtime.     [provider]  Insulin Lispro (HUMALOG KWIKPEN) 200 UNIT/ML SOPN Inject 10 Units into the skin 3 (three) times daily with meals. Increase amount as directed per personal sliding scale instructions 06/13/15   [provider]  levothyroxine (SYNTHROID, LEVOTHROID) 137 MCG tablet Take 1 tablet by mouth every morning.  06/18/17   [provider]  LORazepam (ATIVAN) 0.5 MG tablet Take 0.5 mg by mouth every 8 (eight) hours as needed. for anxiety 06/23/17   [provider]  nicotine polacrilex (NICORETTE) 4 MG gum Take 4 mg by mouth as needed for smoking cessation.    [provider]  potassium chloride SA (KLOR-CON) 20 MEQ tablet Take 1 tablet (20 mEq total) by mouth 3 (three) times daily. 01/02/21   Dione Booze, MD  rOPINIRole (REQUIP) 1 MG  tablet Take 1 mg by mouth at bedtime.  04/18/16   [provider]  traMADol (ULTRAM) 50 MG tablet Take 100 mg by mouth every 8 (eight) hours as needed for moderate pain.     [provider]  VITAMIN D PO Take 1 capsule by mouth daily.    [provider]  Potassium 99 MG TABS Take 1 tablet by mouth daily as needed (for leg cramping).  06/28/20  [provider]     Allergies:     Allergies  Allergen Reactions   Penicillins Other (See Comments)    Did it involve swelling of the face/tongue/throat, SOB, or low BP? Yes Did it involve sudden or severe rash/hives, skin peeling, or any reaction on the inside of your mouth or nose? Yes Did you need to seek medical attention at a hospital or doctor's office? Yes When did it last happen?      60 years old If all above answers are "NO", may proceed with cephalosporin use.     Patient has SJS which prevents taking this medications   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     Physical Exam:   Vitals  Blood pressure (!) 149/91, pulse 85, temperature 97.8 F (36.6 C), temperature source Oral, resp. rate 17, height 4\' 11"  (1.499 m), weight 35.6 kg, SpO2 96 %.  Physical Examination: General appearance - alert, and in no distress  Mental status - alert, oriented to person, place, and time,  Eyes - sclera anicteric Neck - supple, no JVD elevation , Chest - clear  to auscultation bilaterally, symmetrical air movement,  Heart - S1 and S2 normal, regular  Abdomen - soft, nontender, nondistended, no masses or organomegaly Neurological - screening mental status exam normal, neck supple without rigidity, cranial nerves II through XII intact, DTR's normal and symmetric Extremities - no  pedal edema noted, intact peripheral pulses  Skin - warm, dry     Data Review:    CBC Recent Labs  Lab 02/11/21 0553  WBC 8.9  HGB 16.2*  HCT 49.1*  PLT 288  MCV 94.6   MCH 31.2  MCHC 33.0  RDW 13.5  LYMPHSABS 2.5  MONOABS 0.6  EOSABS 0.1  BASOSABS 0.1   ------------------------------------------------------------------------------------------------------------------  Chemistries  Recent Labs  Lab 02/11/21 0553  NA 141  K 2.4*  CL 96*  CO2 34*  GLUCOSE 66*  BUN 14  CREATININE 0.34*  CALCIUM 9.7  MG 1.9  AST 17  ALT 12  ALKPHOS 91  BILITOT 0.4   ------------------------------------------------------------------------------------------------------------------ estimated creatinine clearance is 42 mL/min (A) (by C-G formula based on SCr of 0.34 mg/dL (L)). ------------------------------------------------------------------------------------------------------------------ No results for input(s): TSH, T4TOTAL, T3FREE, THYROIDAB in the last 72 hours.  Invalid input(s): FREET3   Coagulation profile Recent Labs  Lab 02/11/21 0553  INR 0.8   ------------------------------------------------------------------------------------------------------------------- No results for input(s): DDIMER in the last 72 hours. -------------------------------------------------------------------------------------------------------------------  Cardiac Enzymes No results for input(s): CKMB, TROPONINI, MYOGLOBIN in the last 168 hours.  Invalid input(s): CK ------------------------------------------------------------------------------------------------------------------    Component Value Date/Time   BNP 32.0 01/02/2021 0426    Urinalysis    Component Value Date/Time   COLORURINE AMBER (A) 11/05/2019 1638   APPEARANCEUR CLOUDY (A) 11/05/2019 1638   LABSPEC 1.025 11/05/2019 1638   PHURINE 6.5 11/05/2019 1638   GLUCOSEU >=500 (A) 11/05/2019 1638   HGBUR NEGATIVE 11/05/2019 1638   BILIRUBINUR SMALL (A) 11/05/2019 1638   KETONESUR NEGATIVE 11/05/2019 1638   PROTEINUR 100 (A) 11/05/2019 1638   NITRITE NEGATIVE 11/05/2019 1638   LEUKOCYTESUR TRACE (A)  11/05/2019 1638    ----------------------------------------------------------------------------------------------------------------   Imaging Results:    DG Chest Port 1 View  Result Date: 02/11/2021 CLINICAL DATA:  60 year old female with possible sepsis. Weakness. Former smoker. EXAM: PORTABLE CHEST 1 VIEW COMPARISON:  Portable chest 01/02/2021 and earlier. FINDINGS: Portable AP semi upright view at 0614 hours. Chronic large lung volumes. Normal cardiac size and mediastinal contours. Visualized tracheal air column is within normal limits. Stable lung markings. Allowing for portable technique the lungs are clear. No pneumothorax or pleural effusion. Negative visible bowel gas. No acute osseous abnormality identified. Stable cholecystectomy clips. IMPRESSION: Chronic pulmonary hyperinflation suspected. No acute cardiopulmonary abnormality. Electronically Signed   By: Genevie Ann M.D.   On: 02/11/2021 06:26    Radiological Exams on Admission: DG Chest Port 1 View  Result Date: 02/11/2021 CLINICAL DATA:  60 year old female with possible sepsis. Weakness. Former smoker. EXAM: PORTABLE CHEST 1 VIEW COMPARISON:  Portable chest 01/02/2021 and earlier. FINDINGS: Portable AP semi upright view at 0614 hours. Chronic large lung volumes. Normal cardiac size and mediastinal contours. Visualized tracheal air column is within normal limits. Stable lung markings. Allowing for portable technique the lungs are clear. No pneumothorax or pleural effusion. Negative visible bowel gas. No acute osseous abnormality identified. Stable cholecystectomy clips. IMPRESSION: Chronic pulmonary hyperinflation suspected. No acute cardiopulmonary abnormality. Electronically Signed   By: Genevie Ann M.D.   On: 02/11/2021 06:26    DVT Prophylaxis -SCD    Family Communication: Admission, patients condition and plan of care including tests being ordered have been discussed with the patient  who indicate understanding and agree with the plan    Code Status - Full Code  Likely DC to  -patient left AMA  Condition   stable  Roxan Hockey M.D on 02/11/2021 at 9:22 AM  Go to [email or web address] -  for contact info  [facility] - Office  [phone]

## 2021-02-11 NOTE — ED Notes (Signed)
Pt placed under bair hugger

## 2021-02-11 NOTE — ED Provider Notes (Signed)
[facility] EMERGENCY DEPARTMENT Provider Note   CSN: [id number] Arrival date & time: 02/11/21  0541     History Chief Complaint  Patient presents with   Dehydration    Autumn Daniels is a 60 y.o. female.  Patient presents to the emergency department from home by ambulance.  Patient called EMS this morning because she was not feeling well.  She reports that she felt very weak and was feeling cold.  EMS report that the patient was awake and shivering upon their arrival.  They expressed concerns about her living arrangements.  Patient reportedly living alone, having difficulty managing herself.  EMS report a lot of clutter and filth in the home.Marland Kitchen  EMS report that blood sugar was 98 but she expressed to the EMS that her blood sugar normally runs in the 300s.  She took 20 units of insulin at 5 AM but does not remember what her blood sugar was prior to that.       Past Medical History:  Diagnosis Date   Anemia    Anxiety    Depression    Diabetes mellitus without complication (HCC)    type 1   Diarrhea    as of 12/2014 - pt states that she's had diarrhea for a year   Fibromyalgia    Forgetfulness 10/2016   Frequent UTI    due to small urethra   GERD (gastroesophageal reflux disease)    Headache    hx of migraines   History of hiatal hernia    Lupus (HCC)    Memory difficulty 10/2016   Noncompliance    Pneumonia    Stevens-Johnson syndrome (HCC)    Thyroid disease    has had radioactive iodine treatment for hyperthyroidism in 1990   Vitamin D deficiency 07/07/2014    Patient Active Problem List   Diagnosis Date Noted   Acute blood loss anemia 07/20/2014   Vitamin D deficiency 07/07/2014   Hypothyroidism following radioiodine therapy    Type 1 diabetes mellitus, uncontrolled (HCC)    GERD (gastroesophageal reflux disease)    Lupus (HCC)    Tibial plateau fracture 06/29/2014   Closed fracture of thoracic vertebral body (HCC) 09/27/2013    Past Surgical History:   Procedure Laterality Date   CHOLECYSTECTOMY     2 bifurcations of liver during lap chole   EXTERNAL FIXATION LEG Right 06/30/2014   Procedure: EXTERNAL FIXATION LEG;  Surgeon: Vickki Hearing, MD;  Location: [facility];  Service: Orthopedics;  Laterality: Right;  synthes large fragment exteranal fixation  c arm    EXTERNAL FIXATION LEG Right 07/07/2014   Procedure: REVISION EXTERNAL FIXATION LEG;  Surgeon: Myrene Galas, MD;  Location[facility] OR;  Service: Orthopedics;  Laterality: Right;   EXTERNAL FIXATION REMOVAL Right 07/18/2014   Procedure: REMOVAL EXTERNAL FIXATION LEG;  Surgeon: Myrene Galas, MD;  Location: [facility] OR;  Service: Orthopedics;  Laterality: Right;   HARDWARE REMOVAL Right 01/09/2015   Procedure: HARDWARE REMOVAL,RIGHT PROXIMAL TIBIAL;  Surgeon: Myrene Galas, MD;  Location: [facility] OR;  Service: Orthopedics;  Laterality: Right;   KNEE ARTHROSCOPY Right 01/09/2015   Procedure: ARTHROSCOPY KNEE RIGHT ;  Surgeon: Myrene Galas, MD;  Location: [facility] OR;  Service: Orthopedics;  Laterality: Right;   LIVER SURGERY     x4 due to bifurcations from lap chole   ORIF TIBIA PLATEAU Right 07/18/2014   Procedure: RIGHT OPEN REDUCTION INTERNAL FIXATION (ORIF) TIBIA FRACTURE;  Surgeon: Myrene Galas, MD;  Location: [facility] OR;  Service: Orthopedics;  Laterality:  Right;  ORIF of bicondylar plateau fracture   STERIOD INJECTION Bilateral 01/09/2015   Procedure: RIGHT RING FINGER AND LEFT RING FINGER STEROID INJECTION;  Surgeon: Myrene Galas, MD;  Location: [facility] OR;  Service: Orthopedics;  Laterality: Bilateral;   TONSILLECTOMY       OB History   No obstetric history on file.     Family History  Problem Relation Age of Onset   Colon cancer Mother    Lung disease Father    Diabetes Father    Spina bifida Sister    Diabetes Brother     Social History   Tobacco Use   Smoking status: Former    Packs/day: 0.50    Years: 30.00    Pack years: 15.00    Types: Cigarettes    Quit date: 05/29/2014    Years  since quitting: 6.7   Smokeless tobacco: Never  Substance Use Topics   Alcohol use: No   Drug use: No    Home Medications Prior to Admission medications   Medication Sig Start Date End Date Taking? Authorizing Provider  albuterol (VENTOLIN HFA) 108 (90 Base) MCG/ACT inhaler Inhale 2 puffs into the lungs every 4 (four) hours as needed for wheezing or shortness of breath (or coughing). 01/02/21   Dione Booze, MD  gabapentin (NEURONTIN) 400 MG capsule Take 400 mg by mouth 3 (three) times daily.    [provider]  gabapentin (NEURONTIN) 400 MG capsule Take 1 capsule (400 mg total) by mouth 3 (three) times daily. 05/04/18   Sabas Sous, MD  HYDROcodone-acetaminophen (NORCO/VICODIN) 5-325 MG tablet One tablet every six hours for pain.  Limit 7 days. Patient taking differently: Take 0.5-1 tablets by mouth every 6 (six) hours as needed for moderate pain or severe pain.  11/24/17   Darreld Mclean, MD  Insulin Degludec (TRESIBA FLEXTOUCH) 200 UNIT/ML SOPN Inject 8 Units into the skin at bedtime.     [provider]  Insulin Lispro (HUMALOG KWIKPEN) 200 UNIT/ML SOPN Inject 10 Units into the skin 3 (three) times daily with meals. Increase amount as directed per personal sliding scale instructions 06/13/15   [provider]  levothyroxine (SYNTHROID, LEVOTHROID) 137 MCG tablet Take 1 tablet by mouth every morning.  06/18/17   [provider]  LORazepam (ATIVAN) 0.5 MG tablet Take 0.5 mg by mouth every 8 (eight) hours as needed. for anxiety 06/23/17   [provider]  nicotine polacrilex (NICORETTE) 4 MG gum Take 4 mg by mouth as needed for smoking cessation.    [provider]  potassium chloride SA (KLOR-CON) 20 MEQ tablet Take 1 tablet (20 mEq total) by mouth 3 (three) times daily. 01/02/21   Dione Booze, MD  rOPINIRole (REQUIP) 1 MG tablet Take 1 mg by mouth at bedtime.  04/18/16   [provider]  traMADol (ULTRAM) 50 MG tablet Take 100 mg by  mouth every 8 (eight) hours as needed for moderate pain.     [provider]  VITAMIN D PO Take 1 capsule by mouth daily.    [provider]  Potassium 99 MG TABS Take 1 tablet by mouth daily as needed (for leg cramping).  06/28/20  [provider]    Allergies    Penicillins and Sulfa antibiotics  Review of Systems   Review of Systems  Constitutional:  Positive for chills and fatigue.  Respiratory:  Negative for cough and shortness of breath.   Cardiovascular:  Negative for chest pain.  Gastrointestinal:  Negative  for abdominal pain, diarrhea and vomiting.  All other systems reviewed and are negative.  Physical Exam Updated Vital Signs BP (!) 153/96 (BP Location: Left Arm)   Pulse 78   Temp (!) 91.8 F (33.2 C) (Rectal)   Resp 20   Ht 4\' 11"  (1.499 m)   Wt 35.6 kg   SpO2 98%   BMI 15.87 kg/m   Physical Exam Vitals and nursing note reviewed.  Constitutional:      General: She is in acute distress (shivering).     Appearance: Normal appearance. She is well-developed.  HENT:     Head: Normocephalic and atraumatic.     Right Ear: Hearing normal.     Left Ear: Hearing normal.     Nose: Nose normal.  Eyes:     Conjunctiva/sclera: Conjunctivae normal.     Pupils: Pupils are equal, round, and reactive to light.  Cardiovascular:     Rate and Rhythm: Regular rhythm.     Heart sounds: S1 normal and S2 normal. No murmur heard.   No friction rub. No gallop.  Pulmonary:     Effort: Pulmonary effort is normal. No respiratory distress.     Breath sounds: Normal breath sounds.  Chest:     Chest wall: No tenderness.  Abdominal:     General: Bowel sounds are normal.     Palpations: Abdomen is soft.     Tenderness: There is no abdominal tenderness. There is no guarding or rebound. Negative signs include Murphy's sign and McBurney's sign.     Hernia: No hernia is present.  Musculoskeletal:        General: Normal range of motion.     Cervical back: Normal  range of motion and neck supple.  Skin:    General: Skin is warm and dry.     Capillary Refill: Capillary refill takes more than 3 seconds.     Findings: No rash.  Neurological:     Mental Status: She is alert and oriented to person, place, and time.     GCS: GCS eye subscore is 4. GCS verbal subscore is 5. GCS motor subscore is 6.     Cranial Nerves: No cranial nerve deficit.     Sensory: No sensory deficit.     Coordination: Coordination normal.  Psychiatric:        Speech: Speech normal.        Behavior: Behavior normal.        Thought Content: Thought content normal.    ED Results / Procedures / Treatments   Labs (all labs ordered are listed, but only abnormal results are displayed) Labs Reviewed  LACTIC ACID, PLASMA - Abnormal; Notable for the following components:      Result Value   Lactic Acid, Venous 2.9 (*)    All other components within normal limits  COMPREHENSIVE METABOLIC PANEL - Abnormal; Notable for the following components:   Potassium 2.4 (*)    Chloride 96 (*)    CO2 34 (*)    Glucose, Bld 66 (*)    Creatinine, Ser 0.34 (*)    All other components within normal limits  CBC WITH DIFFERENTIAL/PLATELET - Abnormal; Notable for the following components:   RBC 5.19 (*)    Hemoglobin 16.2 (*)    HCT 49.1 (*)    All other components within normal limits  CULTURE, BLOOD (ROUTINE X 2)  CULTURE, BLOOD (ROUTINE X 2)  URINE CULTURE  LACTIC ACID, PLASMA  PROTIME-INR  APTT  URINALYSIS, ROUTINE W  REFLEX MICROSCOPIC  MAGNESIUM  CBG MONITORING, ED  CBG MONITORING, ED    EKG EKG Interpretation  Date/Time:  Monday February 11 2021 06:30:26 EST Ventricular Rate:  75 PR Interval:  151 QRS Duration: 82 QT Interval:  469 QTC Calculation: 524 R Axis:   57 Text Interpretation: Sinus rhythm Nonspecific T abnrm, anterolateral leads Prolonged QT interval Confirmed by Gilda Crease [id number]) on 02/11/2021 6:42:22 AM  Radiology DG Chest Port 1 View  Result  Date: 02/11/2021 CLINICAL DATA:  59 year old female with possible sepsis. Weakness. Former smoker. EXAM: PORTABLE CHEST 1 VIEW COMPARISON:  Portable chest 01/02/2021 and earlier. FINDINGS: Portable AP semi upright view at 0614 hours. Chronic large lung volumes. Normal cardiac size and mediastinal contours. Visualized tracheal air column is within normal limits. Stable lung markings. Allowing for portable technique the lungs are clear. No pneumothorax or pleural effusion. Negative visible bowel gas. No acute osseous abnormality identified. Stable cholecystectomy clips. IMPRESSION: Chronic pulmonary hyperinflation suspected. No acute cardiopulmonary abnormality. Electronically Signed   By: Odessa Fleming M.D.   On: 02/11/2021 06:26    Procedures Procedures   Medications Ordered in ED Medications  lactated ringers infusion (has no administration in time range)  aztreonam (AZACTAM) 2 g in sodium chloride 0.9 % 100 mL IVPB (has no administration in time range)  metroNIDAZOLE (FLAGYL) IVPB 500 mg (has no administration in time range)  dextrose 50 % solution 25 mL (has no administration in time range)  potassium chloride 10 mEq in 100 mL IVPB (has no administration in time range)  aztreonam (AZACTAM) 1 g in sodium chloride 0.9 % 100 mL IVPB (has no administration in time range)  vancomycin (VANCOREADY) IVPB 500 mg/100 mL (has no administration in time range)    ED Course  I have reviewed the triage vital signs and the nursing notes.  Pertinent labs & imaging results that were available during my care of the patient were reviewed by me and considered in my medical decision making (see chart for details).    MDM Rules/Calculators/A&P                           Significantly hypothermic at arrival.  Unclear if this was environmental or if she is septic.  Lactic acid has returned at 2.9, will treat for possible sepsis.  She is not hypotensive, no signs of severe sepsis or endorgan injury at this  time.  Potassium 2.4.  Will initiate IV replacement.  Patient's blood sugar 98 on the way in for EMS.  She indicates that she took 20 units of insulin approximately an hour before she arrived in the ED.  Second blood sugar was 71.  Blood sugar by blood chemistry was 66.  Will administer half an amp of D50 and monitor closely.  Patient will require hospitalization for hypothermia, hypokalemia, possible sepsis.  CRITICAL CARE Performed by: Gilda Crease   Total critical care time: 36 minutes  Critical care time was exclusive of separately billable procedures and treating other patients.  Critical care was necessary to treat or prevent imminent or life-threatening deterioration.  Critical care was time spent personally by me on the following activities: development of treatment plan with patient and/or surrogate as well as nursing, discussions with consultants, evaluation of patient's response to treatment, examination of patient, obtaining history from patient or surrogate, ordering and performing treatments and interventions, ordering and review of laboratory studies, ordering and review of radiographic studies, pulse oximetry and  re-evaluation of patient's condition.   Final Clinical Impression(s) / ED Diagnoses Final diagnoses:  Hypothermia, initial encounter  Hypokalemia    Rx / DC Orders ED Discharge Orders     None        Gilda Crease, MD 02/11/21 [phone]

## 2021-02-11 NOTE — ED Notes (Signed)
Vancomycin ordered for 0700. Nursing still waiting on mediation to be delivered to ED by pharmacy.

## 2021-02-11 NOTE — ED Notes (Signed)
Checked purewick. Still in place

## 2021-02-11 NOTE — ED Triage Notes (Addendum)
BIB [facility] from home with c/o possible dehydration. Per EMS pt CBG was 98, pt states her sugar runs in the 300's. Pt would not eat, says she feels weak and is very cold.  CBG 71 in triage.

## 2021-02-11 NOTE — Sepsis Progress Note (Signed)
[facility] following sepsis protocol 

## 2021-02-11 NOTE — ED Notes (Signed)
Pt was seen walking out of the hospital with her IVs still in. Security stopped pt and asked her to return so her IVs could be removed. Pt has decided after multiple attempts by staff to talk with her that she is leaving AMA. Dr. Marisa Severin has spoken with pt earlier. She wanted to leave earlier in the morning, but we were able to speak with her about the risks of leaving and she agreed to stay at that time. Pt said she just wants to go home and be comfortable. Pt signed AMA paperwork and Dr. Marisa Severin made aware of pt leaving.

## 2021-02-11 NOTE — ED Notes (Signed)
Date and time results received: 02/11/21 6:34 AM  Test: Lactic Acid Critical Value: 2.9  Test: Potassium Critical Value: 2.4  Name of Provider Notified: Dr. Blinda Leatherwood   Orders Received? Or Actions Taken?:

## 2021-02-16 LAB — CULTURE, BLOOD (ROUTINE X 2)
Culture: NO GROWTH
Culture: NO GROWTH
Special Requests: ADEQUATE

## 2021-03-19 ENCOUNTER — Inpatient Hospital Stay (HOSPITAL_COMMUNITY)
Admission: EM | Admit: 2021-03-19 | Discharge: 2021-03-21 | DRG: 638 | Disposition: A | Discharge status: Home / Self Care | Payer: Medicare PPO | Attending: Internal Medicine | Admitting: Internal Medicine

## 2021-03-19 DIAGNOSIS — R739 Hyperglycemia, unspecified: Secondary | ICD-10-CM

## 2021-03-19 DIAGNOSIS — M797 Fibromyalgia: Secondary | ICD-10-CM | POA: Diagnosis present

## 2021-03-19 DIAGNOSIS — Z20822 Contact with and (suspected) exposure to covid-19: Secondary | ICD-10-CM | POA: Diagnosis present

## 2021-03-19 DIAGNOSIS — K219 Gastro-esophageal reflux disease without esophagitis: Secondary | ICD-10-CM

## 2021-03-19 DIAGNOSIS — E1069 Type 1 diabetes mellitus with other specified complication: Secondary | ICD-10-CM

## 2021-03-19 DIAGNOSIS — E1040 Type 1 diabetes mellitus with diabetic neuropathy, unspecified: Secondary | ICD-10-CM | POA: Diagnosis present

## 2021-03-19 DIAGNOSIS — K521 Toxic gastroenteritis and colitis: Secondary | ICD-10-CM | POA: Diagnosis present

## 2021-03-19 DIAGNOSIS — Z79890 Hormone replacement therapy (postmenopausal): Secondary | ICD-10-CM

## 2021-03-19 DIAGNOSIS — N390 Urinary tract infection, site not specified: Secondary | ICD-10-CM | POA: Diagnosis present

## 2021-03-19 DIAGNOSIS — Z8701 Personal history of pneumonia (recurrent): Secondary | ICD-10-CM

## 2021-03-19 DIAGNOSIS — L511 Stevens-Johnson syndrome: Secondary | ICD-10-CM | POA: Diagnosis present

## 2021-03-19 DIAGNOSIS — T3695XA Adverse effect of unspecified systemic antibiotic, initial encounter: Secondary | ICD-10-CM | POA: Diagnosis present

## 2021-03-19 DIAGNOSIS — E890 Postprocedural hypothyroidism: Secondary | ICD-10-CM

## 2021-03-19 DIAGNOSIS — R9431 Abnormal electrocardiogram [ECG] [EKG]: Secondary | ICD-10-CM | POA: Diagnosis present

## 2021-03-19 DIAGNOSIS — Z87891 Personal history of nicotine dependence: Secondary | ICD-10-CM

## 2021-03-19 DIAGNOSIS — F32A Depression, unspecified: Secondary | ICD-10-CM | POA: Diagnosis present

## 2021-03-19 DIAGNOSIS — E876 Hypokalemia: Secondary | ICD-10-CM | POA: Diagnosis not present

## 2021-03-19 DIAGNOSIS — F419 Anxiety disorder, unspecified: Secondary | ICD-10-CM | POA: Diagnosis present

## 2021-03-19 DIAGNOSIS — E1065 Type 1 diabetes mellitus with hyperglycemia: Secondary | ICD-10-CM | POA: Diagnosis not present

## 2021-03-19 DIAGNOSIS — Z880 Allergy status to penicillin: Secondary | ICD-10-CM

## 2021-03-19 DIAGNOSIS — Z794 Long term (current) use of insulin: Secondary | ICD-10-CM

## 2021-03-19 DIAGNOSIS — E43 Unspecified severe protein-calorie malnutrition: Secondary | ICD-10-CM | POA: Diagnosis present

## 2021-03-19 DIAGNOSIS — Z882 Allergy status to sulfonamides status: Secondary | ICD-10-CM

## 2021-03-19 DIAGNOSIS — E559 Vitamin D deficiency, unspecified: Secondary | ICD-10-CM | POA: Diagnosis present

## 2021-03-19 DIAGNOSIS — Z87440 Personal history of urinary (tract) infections: Secondary | ICD-10-CM

## 2021-03-19 DIAGNOSIS — Z833 Family history of diabetes mellitus: Secondary | ICD-10-CM

## 2021-03-19 DIAGNOSIS — Z79899 Other long term (current) drug therapy: Secondary | ICD-10-CM

## 2021-03-19 LAB — BASIC METABOLIC PANEL
Anion gap: 22 — ABNORMAL HIGH (ref 5–15)
BUN: 11 mg/dL (ref 6–20)
CO2: 25 mmol/L (ref 22–32)
Calcium: 8.8 mg/dL — ABNORMAL LOW (ref 8.9–10.3)
Chloride: 83 mmol/L — ABNORMAL LOW (ref 98–111)
Creatinine, Ser: 0.75 mg/dL (ref 0.44–1.00)
GFR, Estimated: >60 mL/min (ref >60)
Glucose, Bld: 554 mg/dL (ref 70–99)
Potassium: <2 mmol/L — CL (ref 3.5–5.1)
Sodium: 130 mmol/L — ABNORMAL LOW (ref 135–145)

## 2021-03-19 LAB — RESP PANEL BY RT-PCR (FLU A&B, COVID) ARPGX2
Influenza A by PCR: NEGATIVE
Influenza B by PCR: NEGATIVE
SARS Coronavirus 2 by RT PCR: NEGATIVE

## 2021-03-19 LAB — CBG MONITORING, ED: Glucose-Capillary: 566 mg/dL (ref 70–99)

## 2021-03-19 LAB — CBC
HCT: 46.6 % — ABNORMAL HIGH (ref 36.0–46.0)
Hemoglobin: 15.9 g/dL — ABNORMAL HIGH (ref 12.0–15.0)
MCH: 30.1 pg (ref 26.0–34.0)
MCHC: 34.1 g/dL (ref 30.0–36.0)
MCV: 88.1 fL (ref 80.0–100.0)
Platelets: 255 10*3/uL (ref 150–400)
RBC: 5.29 MIL/uL — ABNORMAL HIGH (ref 3.87–5.11)
RDW: 12.8 % (ref 11.5–15.5)
WBC: 13.7 10*3/uL — ABNORMAL HIGH (ref 4.0–10.5)
nRBC: 0 % (ref 0.0–0.2)

## 2021-03-19 LAB — MAGNESIUM: Magnesium: 2.1 mg/dL (ref 1.7–2.4)

## 2021-03-19 MED ORDER — ROPINIROLE HCL 1 MG PO TABS
1.0000 mg | ORAL_TABLET | Freq: Every day | ORAL | Status: DC
  Administered 2021-03-19 – 2021-03-20 (×2): 1 mg via ORAL
  Filled 2021-03-19 (×2): qty 1

## 2021-03-19 MED ORDER — FENTANYL CITRATE PF 50 MCG/ML IJ SOSY
50.0000 ug | PREFILLED_SYRINGE | Freq: Once | INTRAMUSCULAR | Status: AC
  Administered 2021-03-19: 22:00:00 50 ug via INTRAVENOUS
  Filled 2021-03-19: qty 1

## 2021-03-19 MED ORDER — SODIUM CHLORIDE 0.9 % IV SOLN: INTRAVENOUS | Status: DC

## 2021-03-19 MED ORDER — LACTATED RINGERS IV BOLUS
500.0000 mL | Freq: Once | INTRAVENOUS | Status: AC
  Administered 2021-03-19: 22:00:00 500 mL via INTRAVENOUS

## 2021-03-19 MED ORDER — ALBUTEROL SULFATE HFA 108 (90 BASE) MCG/ACT IN AERS: 2.0000 | INHALATION_SPRAY | RESPIRATORY_TRACT | Status: DC | PRN

## 2021-03-19 MED ORDER — ONDANSETRON HCL 4 MG/2ML IJ SOLN: 4.0000 mg | Freq: Four times a day (QID) | INTRAMUSCULAR | Status: DC | PRN

## 2021-03-19 MED ORDER — ENOXAPARIN SODIUM 30 MG/0.3ML IJ SOSY
30.0000 mg | PREFILLED_SYRINGE | INTRAMUSCULAR | Status: DC
  Administered 2021-03-20 – 2021-03-21 (×2): 30 mg via SUBCUTANEOUS
  Filled 2021-03-19 (×2): qty 0.3

## 2021-03-19 MED ORDER — ALBUTEROL SULFATE (2.5 MG/3ML) 0.083% IN NEBU: 2.5000 mg | INHALATION_SOLUTION | RESPIRATORY_TRACT | Status: DC | PRN

## 2021-03-19 MED ORDER — POTASSIUM CHLORIDE 10 MEQ/100ML IV SOLN
10.0000 meq | INTRAVENOUS | Status: AC
  Administered 2021-03-19 (×4): 10 meq via INTRAVENOUS
  Filled 2021-03-19 (×3): qty 100

## 2021-03-19 MED ORDER — ACETAMINOPHEN 325 MG PO TABS: 650.0000 mg | ORAL_TABLET | Freq: Four times a day (QID) | ORAL | Status: DC | PRN

## 2021-03-19 MED ORDER — LEVOTHYROXINE SODIUM 137 MCG PO TABS: 137.0000 ug | ORAL_TABLET | Freq: Every morning | ORAL | Status: DC

## 2021-03-19 MED ORDER — GABAPENTIN 400 MG PO CAPS
400.0000 mg | ORAL_CAPSULE | Freq: Three times a day (TID) | ORAL | Status: DC
  Administered 2021-03-19 – 2021-03-21 (×5): 400 mg via ORAL
  Filled 2021-03-19 (×5): qty 1

## 2021-03-19 MED ORDER — POTASSIUM CHLORIDE CRYS ER 20 MEQ PO TBCR
40.0000 meq | EXTENDED_RELEASE_TABLET | Freq: Two times a day (BID) | ORAL | Status: DC
  Administered 2021-03-20: 01:00:00 40 meq via ORAL
  Filled 2021-03-19: qty 2

## 2021-03-19 MED ORDER — LEVOTHYROXINE SODIUM 137 MCG PO TABS
137.0000 ug | ORAL_TABLET | Freq: Every day | ORAL | Status: DC
  Administered 2021-03-20 – 2021-03-21 (×2): 137 ug via ORAL
  Filled 2021-03-19 (×2): qty 1

## 2021-03-19 MED ORDER — TRAMADOL HCL 50 MG PO TABS: 100.0000 mg | ORAL_TABLET | Freq: Three times a day (TID) | ORAL | Status: DC | PRN

## 2021-03-19 MED ORDER — LORAZEPAM 0.5 MG PO TABS: 0.5000 mg | ORAL_TABLET | Freq: Three times a day (TID) | ORAL | Status: DC | PRN

## 2021-03-19 MED ORDER — ONDANSETRON HCL 4 MG PO TABS: 4.0000 mg | ORAL_TABLET | Freq: Four times a day (QID) | ORAL | Status: DC | PRN

## 2021-03-19 MED ORDER — ACETAMINOPHEN 650 MG RE SUPP: 650.0000 mg | Freq: Four times a day (QID) | RECTAL | Status: DC | PRN

## 2021-03-19 NOTE — H&P (Signed)
History and Physical    Autumn Daniels [id number] DOB: 1960-04-24 DOA: 03/19/2021  PCP: Jettie Booze, NP   Patient coming from: Home   Chief Complaint:  generalized weakness   HPI: Autumn Daniels is a 60 y.o. female with medical history significant of  T1 DM, depression, GERD, frequent UTI, and fibromyalgia who presented with generalized weakness.  Patient reported 2 weeks of progressive and worsening generalized weakness.  5 days ago she had a televisit, where she was diagnosed with urinary tract infection and received oral antibiotic therapy. Her symptoms did not improve, and for the last 2 days she had developed severe and persistent diarrhea, with no improving or worsening factors, no associated abdominal pain, nausea or vomiting. She reports continue using her insulin therapy.  Because of her severe weakness she was unable to get out of bed and her cousin brought her to the hospital.  ED Course: She was noted severely hypokalemic and hyperglycemic. Received potassium supplementation, intravenous fluids and referred for hospitalization.  Review of Systems:  1. General:Generalized weakness no fevers or chills. 2. ENT: No runny nose or sore throat, no hearing disturbances 3. Pulmonary: No dyspnea, cough, wheezing, or hemoptysis 4. Cardiovascular: No angina, claudication, lower extremity edema, pnd or orthopnea 5. Gastrointestinal: Positive diarrhea as mentioned in history of present illness. 6. Hematology: No easy bruisability or frequent infections 7. Urology: No dysuria, hematuria or increased urinary frequency 8. Dermatology: No rashes. 9. Neurology: No seizures or paresthesias 10. Musculoskeletal: No joint pain or deformities  Past Medical History:  Diagnosis Date   Anemia    Anxiety    Depression    Diabetes mellitus without complication ([place])    type 1   Diarrhea    as of 12/2014 - pt states that she's had diarrhea for a year   Fibromyalgia    Forgetfulness 10/2016    Frequent UTI    due to small urethra   GERD (gastroesophageal reflux disease)    Headache    hx of migraines   History of hiatal hernia    Lupus ([place])    Memory difficulty 10/2016   Noncompliance    Pneumonia    Stevens-Johnson syndrome (HCC)    Thyroid disease    has had radioactive iodine treatment for hyperthyroidism in 1990   Vitamin D deficiency 07/07/2014    Past Surgical History:  Procedure Laterality Date   CHOLECYSTECTOMY     2 bifurcations of liver during lap chole   EXTERNAL FIXATION LEG Right 06/30/2014   Procedure: EXTERNAL FIXATION LEG;  Surgeon: Carole Civil, MD;  Location: [facility];  Service: Orthopedics;  Laterality: Right;  synthes large fragment exteranal fixation  c arm    EXTERNAL FIXATION LEG Right 07/07/2014   Procedure: REVISION EXTERNAL FIXATION LEG;  Surgeon: Altamese [place] Davis, MD;  Location: [place];  Service: Orthopedics;  Laterality: Right;   EXTERNAL FIXATION REMOVAL Right 07/18/2014   Procedure: REMOVAL EXTERNAL FIXATION LEG;  Surgeon: Altamese [place], MD;  Location: [place];  Service: Orthopedics;  Laterality: Right;   HARDWARE REMOVAL Right 01/09/2015   Procedure: HARDWARE REMOVAL,RIGHT PROXIMAL TIBIAL;  Surgeon: Altamese [place], MD;  Location: [place];  Service: Orthopedics;  Laterality: Right;   KNEE ARTHROSCOPY Right 01/09/2015   Procedure: ARTHROSCOPY KNEE RIGHT ;  Surgeon: Altamese , MD;  Location: [place];  Service: Orthopedics;  Laterality: Right;   LIVER SURGERY     x4 due to bifurcations from lap chole   ORIF TIBIA PLATEAU Right 07/18/2014   Procedure:  RIGHT OPEN REDUCTION INTERNAL FIXATION (ORIF) TIBIA FRACTURE;  Surgeon: Altamese Wilkeson, MD;  Location: [place];  Service: Orthopedics;  Laterality: Right;  ORIF of bicondylar plateau fracture   STERIOD INJECTION Bilateral 01/09/2015   Procedure: RIGHT RING FINGER AND LEFT RING FINGER STEROID INJECTION;  Surgeon: Altamese Pope, MD;  Location: [place];  Service: Orthopedics;  Laterality: Bilateral;    TONSILLECTOMY       reports that she quit smoking about 6 years ago. Her smoking use included cigarettes. She has a 15.00 pack-year smoking history. She has never used smokeless tobacco. She reports that she does not drink alcohol and does not use drugs.  Allergies  Allergen Reactions   Penicillins Other (See Comments)    Did it involve swelling of the face/tongue/throat, SOB, or low BP? Yes Did it involve sudden or severe rash/hives, skin peeling, or any reaction on the inside of your mouth or nose? Yes Did you need to seek medical attention at a hospital or doctor's office? Yes When did it last happen?      60 years old If all above answers are "NO", may proceed with cephalosporin use.     Patient has SJS which prevents taking this medications   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    Family History  Problem Relation Age of Onset   Colon cancer Mother    Lung disease Father    Diabetes Father    Spina bifida Sister    Diabetes Brother      Prior to Admission medications   Medication Sig Start Date End Date Taking? Authorizing Provider  albuterol (VENTOLIN HFA) 108 (90 Base) MCG/ACT inhaler Inhale 2 puffs into the lungs every 4 (four) hours as needed for wheezing or shortness of breath (or coughing). 123XX123   Delora Fuel, MD  gabapentin (NEURONTIN) 400 MG capsule Take 1 capsule (400 mg total) by mouth 3 (three) times daily. 05/04/18   Maudie Flakes, MD  HYDROcodone-acetaminophen (NORCO/VICODIN) 5-325 MG tablet One tablet every six hours for pain.  Limit 7 days. Patient not taking: Reported on 02/11/2021 11/24/17   Sanjuana Kava, MD  insulin lispro (HUMALOG) 200 UNIT/ML KwikPen Inject 10 Units into the skin 3 (three) times daily with meals. Increase amount as directed per personal sliding scale instructions 06/13/15   [provider]  levothyroxine (SYNTHROID, LEVOTHROID) 137 MCG tablet Take 1 tablet by mouth  every morning.  06/18/17   [provider]  LORazepam (ATIVAN) 0.5 MG tablet Take 0.5 mg by mouth every 8 (eight) hours as needed. for anxiety 06/23/17   [provider]  potassium chloride SA (KLOR-CON) 20 MEQ tablet Take 1 tablet (20 mEq total) by mouth 3 (three) times daily. 123XX123   Delora Fuel, MD  rOPINIRole (REQUIP) 1 MG tablet Take 1 mg by mouth at bedtime.  04/18/16   [provider]  traMADol (ULTRAM) 50 MG tablet Take 100 mg by mouth every 8 (eight) hours as needed for moderate pain.     [provider]  VITAMIN D PO Take 1 capsule by mouth daily.    [provider]  Potassium 99 MG TABS Take 1 tablet by mouth daily as needed (for leg cramping).  06/28/20  [provider]    Physical Exam: Vitals:   03/19/21 2000 03/19/21 2100 03/19/21 2130 03/19/21 2300  BP: 131/76 116/63 134/76 137/84  Pulse: 84 81 85 83  Resp: 11 12 15  17  Temp:      TempSrc:      SpO2: 95% 96% 94% 98%  Weight:      Height:        Vitals:   03/19/21 2000 03/19/21 2100 03/19/21 2130 03/19/21 2300  BP: 131/76 116/63 134/76 137/84  Pulse: 84 81 85 83  Resp: 11 12 15 17   Temp:      TempSrc:      SpO2: 95% 96% 94% 98%  Weight:      Height:       General: Deconditioned and ill looking appearing. Neurology: Awake and alert, non focal Head and Neck. Head normocephalic. Neck supple with no adenopathy or thyromegaly.   E ENT: positive pallor, no icterus, oral mucosa dry Cardiovascular: No JVD. S1-S2 present, rhythmic, no gallops, rubs, or murmurs. No lower extremity edema. Pulmonary: positive breath sounds bilaterally, with no wheezing, rhonchi or rales. Gastrointestinal. Abdomen soft and non tender Skin. No rashes Musculoskeletal: no joint deformities    Labs on Admission: I have personally reviewed following labs and imaging studies  CBC: Recent Labs  Lab 03/19/21 1914  WBC 13.7*  HGB 15.9*  HCT 46.6*  MCV 88.1  PLT 255   Basic Metabolic  Panel: Recent Labs  Lab 03/19/21 1914  NA 130*  K <2.0*  CL 83*  CO2 25  GLUCOSE 554*  BUN 11  CREATININE 0.75  CALCIUM 8.8*  MG 2.1   GFR: Estimated Creatinine Clearance: 41.2 mL/min (by C-G formula based on SCr of 0.75 mg/dL). Liver Function Tests: No results for input(s): AST, ALT, ALKPHOS, BILITOT, PROT, ALBUMIN in the last 168 hours. No results for input(s): LIPASE, AMYLASE in the last 168 hours. No results for input(s): AMMONIA in the last 168 hours. Coagulation Profile: No results for input(s): INR, PROTIME in the last 168 hours. Cardiac Enzymes: No results for input(s): CKTOTAL, CKMB, CKMBINDEX, TROPONINI in the last 168 hours. BNP (last 3 results) No results for input(s): PROBNP in the last 8760 hours. HbA1C: No results for input(s): HGBA1C in the last 72 hours. CBG: Recent Labs  Lab 03/19/21 1833  GLUCAP 566*   Lipid Profile: No results for input(s): CHOL, HDL, LDLCALC, TRIG, CHOLHDL, LDLDIRECT in the last 72 hours. Thyroid Function Tests: No results for input(s): TSH, T4TOTAL, FREET4, T3FREE, THYROIDAB in the last 72 hours. Anemia Panel: No results for input(s): VITAMINB12, FOLATE, FERRITIN, TIBC, IRON, RETICCTPCT in the last 72 hours. Urine analysis:    Component Value Date/Time   COLORURINE AMBER (A) 11/05/2019 1638   APPEARANCEUR CLOUDY (A) 11/05/2019 1638   LABSPEC 1.025 11/05/2019 1638   PHURINE 6.5 11/05/2019 1638   GLUCOSEU >=500 (A) 11/05/2019 1638   HGBUR NEGATIVE 11/05/2019 1638   BILIRUBINUR SMALL (A) 11/05/2019 1638   KETONESUR NEGATIVE 11/05/2019 1638   PROTEINUR 100 (A) 11/05/2019 1638   NITRITE NEGATIVE 11/05/2019 1638   LEUKOCYTESUR TRACE (A) 11/05/2019 1638    Radiological Exams on Admission: No results found.  EKG: Independently reviewed.  85 bpm, normal axis, QTC 605, sinus rhythm, no significant ST segment or T wave changes.  60 year old female with type 1 diabetes mellitus who presented with generalized weakness for 2 weeks,  associated with 2 days of diarrhea.  Severe symptoms to the point where she was unable to get out of bed.  On her initial physical examination temperature 98.8, heart rate 83, respiratory rate 16, oxygen saturation 94% on room air.  She had dry mucous membranes, her lungs are clear to auscultation bilaterally, heart S1-S2,  present, rhythmic, soft abdomen, no lower extremity edema.  Sodium 130, potassium less than 2.0, chloride 83, bicarb 25, glucose 554, BUN 11, creatinine 0.75, white count 13.7, hemoglobin 15.9, hematocrit 46.6, platelets 255. Mg 2.1  SARS COVID-19 negative     Assessment/Plan Principal Problem:   Hypokalemia Active Problems:   Hypothyroidism following radioiodine therapy   Type 1 diabetes mellitus with other specified complication (HCC)   GERD (gastroesophageal reflux disease)    Autumn Daniels is being admitted to the hospital with a working diagnosis of severe hypokalemia, uncontrolled hyperglycemia.  Severe hypokalemia.  Patient has received 40 mEq of potassium chloride intravenously in the ED.  Add  40 meq Kcl po x 2 doses.  Continue volume repletion with isotonic saline at 100 ml per hr.  Follow up renal function and electrolytes   Prolonged Qtc, plan to repeat EKG when K is corrected. Pending urine analysis before resuming antibiotic therapy. If persistent diarrhea may need C diff testing.   2.  Uncontrolled hyperglycemia/type 1 diabetes mellitus..  No metabolic acidosis. At the basal insulin 10 units glargine and continue insulin sliding scale for glucose coverage and monitoring. Apparently at home she is only on sliding scale. Consult nutrition, PT and Ot.   3.  Diabetic neuropathy.  Continue with gabapentin.  4.  Anxiety.  Continue as needed lorazepam.  5.  Hypothyroidism.  Continue levothyroxine.   Status is: Observation  The patient remains OBS appropriate and will d/c before 2 midnights.   DVT prophylaxis: Enoxaparin   Code Status:    full   Family Communication:   I spoke with patient's cousin at the bedside, we talked in detail about patient's condition, plan of care and prognosis and all questions were addressed.     Consults called:  None   Admission status:   Observation    Kordae Buonocore Gerome Apley MD [facility]   03/19/2021, 11:43 PM

## 2021-03-19 NOTE — ED Provider Notes (Signed)
[facility] EMERGENCY DEPARTMENT Provider Note   CSN: [id number] Arrival date & time: 03/19/21  1810     History Chief Complaint  Patient presents with   Hyperglycemia    Autumn Daniels is a 60 y.o. female.   Hyperglycemia Associated symptoms: dysuria, fatigue, polyuria and weakness   Associated symptoms: no abdominal pain, no confusion and no shortness of breath   Patient presents with generalized weakness.  Has been feeling bad over the last few days.  States she was too weak to go out and go to [place].  Difficulty getting out of bed.  Sugars been running high.  Reportedly on antibiotics for UTI.  Appears she is on Macrobid.  History of noncompliance.  Reportedly family is worried about her ability to take care of her self.  Patient states she is not able to really take care of herself right now.  Sugar above 400 per EMS.    Past Medical History:  Diagnosis Date   Anemia    Anxiety    Depression    Diabetes mellitus without complication (HCC)    type 1   Diarrhea    as of 12/2014 - pt states that she's had diarrhea for a year   Fibromyalgia    Forgetfulness 10/2016   Frequent UTI    due to small urethra   GERD (gastroesophageal reflux disease)    Headache    hx of migraines   History of hiatal hernia    Lupus (HCC)    Memory difficulty 10/2016   Noncompliance    Pneumonia    Stevens-Johnson syndrome (HCC)    Thyroid disease    has had radioactive iodine treatment for hyperthyroidism in 1990   Vitamin D deficiency 07/07/2014    Patient Active Problem List   Diagnosis Date Noted   Hypokalemia 02/11/2021   Acute blood loss anemia 07/20/2014   Vitamin D deficiency 07/07/2014   Hypothyroidism following radioiodine therapy    Type 1 diabetes mellitus with other specified complication (HCC)    GERD (gastroesophageal reflux disease)    Lupus (HCC)    Tibial plateau fracture 06/29/2014   Closed fracture of thoracic vertebral body (HCC) 09/27/2013    Past  Surgical History:  Procedure Laterality Date   CHOLECYSTECTOMY     2 bifurcations of liver during lap chole   EXTERNAL FIXATION LEG Right 06/30/2014   Procedure: EXTERNAL FIXATION LEG;  Surgeon: Vickki Hearing, MD;  Location: [facility];  Service: Orthopedics;  Laterality: Right;  synthes large fragment exteranal fixation  c arm    EXTERNAL FIXATION LEG Right 07/07/2014   Procedure: REVISION EXTERNAL FIXATION LEG;  Surgeon: Myrene Galas, MD;  Location: [facility];  Service: Orthopedics;  Laterality: Right;   EXTERNAL FIXATION REMOVAL Right 07/18/2014   Procedure: REMOVAL EXTERNAL FIXATION LEG;  Surgeon: Myrene Galas, MD;  Location: [facility] OR;  Service: Orthopedics;  Laterality: Right;   HARDWARE REMOVAL Right 01/09/2015   Procedure: HARDWARE REMOVAL,RIGHT PROXIMAL TIBIAL;  Surgeon: Myrene Galas, MD;  Location: [facility] OR;  Service: Orthopedics;  Laterality: Right;   KNEE ARTHROSCOPY Right 01/09/2015   Procedure: ARTHROSCOPY KNEE RIGHT ;  Surgeon: Myrene Galas, MD;  Location: [facility] OR;  Service: Orthopedics;  Laterality: Right;   LIVER SURGERY     x4 due to bifurcations from lap chole   ORIF TIBIA PLATEAU Right 07/18/2014   Procedure: RIGHT OPEN REDUCTION INTERNAL FIXATION (ORIF) TIBIA FRACTURE;  Surgeon: Myrene Galas, MD;  Location: [facility] OR;  Service: Orthopedics;  Laterality: Right;  ORIF of bicondylar plateau fracture   STERIOD INJECTION Bilateral 01/09/2015   Procedure: RIGHT RING FINGER AND LEFT RING FINGER STEROID INJECTION;  Surgeon: Myrene Galas, MD;  Location: [facility] OR;  Service: Orthopedics;  Laterality: Bilateral;   TONSILLECTOMY       OB History   No obstetric history on file.     Family History  Problem Relation Age of Onset   Colon cancer Mother    Lung disease Father    Diabetes Father    Spina bifida Sister    Diabetes Brother     Social History   Tobacco Use   Smoking status: Former    Packs/day: 0.50    Years: 30.00    Pack years: 15.00    Types: Cigarettes    Quit date:  05/29/2014    Years since quitting: 6.8   Smokeless tobacco: Never  Substance Use Topics   Alcohol use: No   Drug use: No    Home Medications Prior to Admission medications   Medication Sig Start Date End Date Taking? Authorizing Provider  albuterol (VENTOLIN HFA) 108 (90 Base) MCG/ACT inhaler Inhale 2 puffs into the lungs every 4 (four) hours as needed for wheezing or shortness of breath (or coughing). 01/02/21   Dione Booze, MD  gabapentin (NEURONTIN) 400 MG capsule Take 1 capsule (400 mg total) by mouth 3 (three) times daily. 05/04/18   Sabas Sous, MD  HYDROcodone-acetaminophen (NORCO/VICODIN) 5-325 MG tablet One tablet every six hours for pain.  Limit 7 days. Patient not taking: Reported on 02/11/2021 11/24/17   Darreld Mclean, MD  insulin lispro (HUMALOG) 200 UNIT/ML KwikPen Inject 10 Units into the skin 3 (three) times daily with meals. Increase amount as directed per personal sliding scale instructions 06/13/15   [provider]  levothyroxine (SYNTHROID, LEVOTHROID) 137 MCG tablet Take 1 tablet by mouth every morning.  06/18/17   [provider]  LORazepam (ATIVAN) 0.5 MG tablet Take 0.5 mg by mouth every 8 (eight) hours as needed. for anxiety 06/23/17   [provider]  potassium chloride SA (KLOR-CON) 20 MEQ tablet Take 1 tablet (20 mEq total) by mouth 3 (three) times daily. 01/02/21   Dione Booze, MD  rOPINIRole (REQUIP) 1 MG tablet Take 1 mg by mouth at bedtime.  04/18/16   [provider]  traMADol (ULTRAM) 50 MG tablet Take 100 mg by mouth every 8 (eight) hours as needed for moderate pain.     [provider]  VITAMIN D PO Take 1 capsule by mouth daily.    [provider]  Potassium 99 MG TABS Take 1 tablet by mouth daily as needed (for leg cramping).  06/28/20  [provider]    Allergies    Penicillins and Sulfa antibiotics  Review of Systems   Review of Systems  Constitutional:  Positive for appetite change and  fatigue.  HENT:  Negative for congestion.   Respiratory:  Negative for shortness of breath.   Cardiovascular:  Negative for leg swelling.  Gastrointestinal:  Negative for abdominal pain.  Endocrine: Positive for polyuria.  Genitourinary:  Positive for dysuria.  Musculoskeletal:  Negative for back pain.  Skin:  Negative for rash.  Neurological:  Positive for weakness.  Psychiatric/Behavioral:  Negative for confusion.    Physical Exam Updated Vital Signs BP 137/84    Pulse 83    Temp 98.8 F (37.1 C) (Oral)    Resp 17    Ht 4\' 11"  (1.499 m)  Wt 34.9 kg    SpO2 98%    BMI 15.55 kg/m   Physical Exam Vitals and nursing note reviewed.  HENT:     Head: Normocephalic.     Mouth/Throat:     Mouth: Mucous membranes are dry.  Eyes:     Pupils: Pupils are equal, round, and reactive to light.  Cardiovascular:     Rate and Rhythm: Regular rhythm.  Pulmonary:     Effort: Pulmonary effort is normal.  Abdominal:     Tenderness: There is no abdominal tenderness.  Musculoskeletal:        General: No tenderness.  Skin:    General: Skin is warm.     Capillary Refill: Capillary refill takes less than 2 seconds.  Neurological:     Mental Status: She is oriented to person, place, and time.    ED Results / Procedures / Treatments   Labs (all labs ordered are listed, but only abnormal results are displayed) Labs Reviewed  BASIC METABOLIC PANEL - Abnormal; Notable for the following components:      Result Value   Sodium 130 (*)    Potassium <2.0 (*)    Chloride 83 (*)    Glucose, Bld 554 (*)    Calcium 8.8 (*)    Anion gap 22 (*)    All other components within normal limits  CBC - Abnormal; Notable for the following components:   WBC 13.7 (*)    RBC 5.29 (*)    Hemoglobin 15.9 (*)    HCT 46.6 (*)    All other components within normal limits  CBG MONITORING, ED - Abnormal; Notable for the following components:   Glucose-Capillary 566 (*)    All other components within normal  limits  RESP PANEL BY RT-PCR (FLU A&B, COVID) ARPGX2  MAGNESIUM  URINALYSIS, ROUTINE W REFLEX MICROSCOPIC  HIV ANTIBODY (ROUTINE TESTING W REFLEX)  BASIC METABOLIC PANEL  CBC  CBG MONITORING, ED  CBG MONITORING, ED    EKG EKG Interpretation  Date/Time:  Tuesday March 19 2021 20:23:07 EST Ventricular Rate:  85 PR Interval:  152 QRS Duration: 86 QT Interval:  507 QTC Calculation: 603 R Axis:   3 Text Interpretation: Sinus rhythm Borderline repolarization abnormality Prolonged QT interval Confirmed by Benjiman Core [phone]) on 03/19/2021 10:12:10 PM  Radiology No results found.  Procedures Procedures   Medications Ordered in ED Medications  potassium chloride 10 mEq in 100 mL IVPB (10 mEq Intravenous New Bag/Given 03/19/21 2332)  traMADol (ULTRAM) tablet 100 mg (has no administration in time range)  LORazepam (ATIVAN) tablet 0.5 mg (has no administration in time range)  gabapentin (NEURONTIN) capsule 400 mg (400 mg Oral Given 03/19/21 2259)  rOPINIRole (REQUIP) tablet 1 mg (1 mg Oral Given 03/19/21 2258)  enoxaparin (LOVENOX) injection 30 mg (has no administration in time range)  acetaminophen (TYLENOL) tablet 650 mg (has no administration in time range)    Or  acetaminophen (TYLENOL) suppository 650 mg (has no administration in time range)  ondansetron (ZOFRAN) tablet 4 mg (has no administration in time range)    Or  ondansetron (ZOFRAN) injection 4 mg (has no administration in time range)  levothyroxine (SYNTHROID) tablet 137 mcg (has no administration in time range)  albuterol (PROVENTIL) (2.5 MG/3ML) 0.083% nebulizer solution 2.5 mg (has no administration in time range)  potassium chloride SA (KLOR-CON M) CR tablet 40 mEq (has no administration in time range)  0.9 %  sodium chloride infusion (has no administration in time range)  pantoprazole (PROTONIX) EC tablet 40 mg (has no administration in time range)  insulin glargine-yfgn (SEMGLEE) injection 10 Units (has  no administration in time range)  insulin aspart (novoLOG) injection 0-15 Units (has no administration in time range)  fentaNYL (SUBLIMAZE) injection 50 mcg (50 mcg Intravenous Given 03/19/21 2223)  lactated ringers bolus 500 mL (0 mLs Intravenous Stopped 03/19/21 2331)    ED Course  I have reviewed the triage vital signs and the nursing notes.  Pertinent labs & imaging results that were available during my care of the patient were reviewed by me and considered in my medical decision making (see chart for details).    MDM Rules/Calculators/A&P                         Patient with generalized weakness.  Has not been doing well at home.  Reportedly had been unable to get up and go to [place].  Hyperglycemic here.  Unfortunately also has severe hypokalemia.  Does have an anion gap of 22.  White count somewhat elevated.  Urinalysis still pending however.  Magnesium reassuring.  However cannot give insulin yet with a severe hypokalemia.  Will supplement first.  Will admit to hospital.    Final Clinical Impression(s) / ED Diagnoses Final diagnoses:  Hyperglycemia  Hypokalemia    Rx / DC Orders ED Discharge Orders     None        Benjiman Core, MD 03/20/21 0005

## 2021-03-19 NOTE — ED Triage Notes (Signed)
Pt to ED via EMS from home initial call out "[facility]"  , pt family was worried about pt, d/t hadn't been able to reach pt and had a wellness check done, Per EMS pt home is unkempt , family concerned pt cant care for self etc. Apparently this is an ongoing concern with family. Pt currently on ABX for UTI , pt non compliant with diabetic regimen. Cbg 427. Last VS130/77, P 90, 97%ra, 98.3 temp A&O X 4. No medications given by EMS.

## 2021-03-20 ENCOUNTER — Other Ambulatory Visit: Payer: Self-pay

## 2021-03-20 ENCOUNTER — Encounter (HOSPITAL_COMMUNITY): Payer: Self-pay | Admitting: Internal Medicine

## 2021-03-20 DIAGNOSIS — Z79899 Other long term (current) drug therapy: Secondary | ICD-10-CM | POA: Diagnosis not present

## 2021-03-20 DIAGNOSIS — Z794 Long term (current) use of insulin: Secondary | ICD-10-CM | POA: Diagnosis not present

## 2021-03-20 DIAGNOSIS — Z20822 Contact with and (suspected) exposure to covid-19: Secondary | ICD-10-CM | POA: Diagnosis present

## 2021-03-20 DIAGNOSIS — N390 Urinary tract infection, site not specified: Secondary | ICD-10-CM | POA: Diagnosis present

## 2021-03-20 DIAGNOSIS — E1069 Type 1 diabetes mellitus with other specified complication: Secondary | ICD-10-CM | POA: Diagnosis present

## 2021-03-20 DIAGNOSIS — E1040 Type 1 diabetes mellitus with diabetic neuropathy, unspecified: Secondary | ICD-10-CM | POA: Diagnosis present

## 2021-03-20 DIAGNOSIS — L511 Stevens-Johnson syndrome: Secondary | ICD-10-CM | POA: Diagnosis present

## 2021-03-20 DIAGNOSIS — K521 Toxic gastroenteritis and colitis: Secondary | ICD-10-CM | POA: Diagnosis present

## 2021-03-20 DIAGNOSIS — Z87891 Personal history of nicotine dependence: Secondary | ICD-10-CM | POA: Diagnosis not present

## 2021-03-20 DIAGNOSIS — E890 Postprocedural hypothyroidism: Secondary | ICD-10-CM | POA: Diagnosis present

## 2021-03-20 DIAGNOSIS — Z880 Allergy status to penicillin: Secondary | ICD-10-CM | POA: Diagnosis not present

## 2021-03-20 DIAGNOSIS — R9431 Abnormal electrocardiogram [ECG] [EKG]: Secondary | ICD-10-CM | POA: Diagnosis present

## 2021-03-20 DIAGNOSIS — F419 Anxiety disorder, unspecified: Secondary | ICD-10-CM | POA: Diagnosis present

## 2021-03-20 DIAGNOSIS — M797 Fibromyalgia: Secondary | ICD-10-CM | POA: Diagnosis present

## 2021-03-20 DIAGNOSIS — Z87440 Personal history of urinary (tract) infections: Secondary | ICD-10-CM | POA: Diagnosis not present

## 2021-03-20 DIAGNOSIS — E876 Hypokalemia: Secondary | ICD-10-CM | POA: Diagnosis present

## 2021-03-20 DIAGNOSIS — E1065 Type 1 diabetes mellitus with hyperglycemia: Secondary | ICD-10-CM | POA: Diagnosis present

## 2021-03-20 DIAGNOSIS — Z882 Allergy status to sulfonamides status: Secondary | ICD-10-CM | POA: Diagnosis not present

## 2021-03-20 DIAGNOSIS — Z8701 Personal history of pneumonia (recurrent): Secondary | ICD-10-CM | POA: Diagnosis not present

## 2021-03-20 DIAGNOSIS — K219 Gastro-esophageal reflux disease without esophagitis: Secondary | ICD-10-CM | POA: Diagnosis present

## 2021-03-20 DIAGNOSIS — F32A Depression, unspecified: Secondary | ICD-10-CM | POA: Diagnosis present

## 2021-03-20 DIAGNOSIS — Z833 Family history of diabetes mellitus: Secondary | ICD-10-CM | POA: Diagnosis not present

## 2021-03-20 DIAGNOSIS — E559 Vitamin D deficiency, unspecified: Secondary | ICD-10-CM | POA: Diagnosis present

## 2021-03-20 DIAGNOSIS — Z79890 Hormone replacement therapy (postmenopausal): Secondary | ICD-10-CM | POA: Diagnosis not present

## 2021-03-20 LAB — BASIC METABOLIC PANEL
Anion gap: 11 (ref 5–15)
Anion gap: 9 (ref 5–15)
BUN: 7 mg/dL (ref 6–20)
BUN: 6 mg/dL (ref 6–20)
CO2: 30 mmol/L (ref 22–32)
CO2: 28 mmol/L (ref 22–32)
Calcium: 8.6 mg/dL — ABNORMAL LOW (ref 8.9–10.3)
Calcium: 8.2 mg/dL — ABNORMAL LOW (ref 8.9–10.3)
Chloride: 94 mmol/L — ABNORMAL LOW (ref 98–111)
Chloride: 98 mmol/L (ref 98–111)
Creatinine, Ser: 0.55 mg/dL (ref 0.44–1.00)
Creatinine, Ser: <0.3 mg/dL — ABNORMAL LOW (ref 0.44–1.00)
GFR, Estimated: >60 mL/min (ref >60)
Glucose, Bld: 477 mg/dL — ABNORMAL HIGH (ref 70–99)
Glucose, Bld: 113 mg/dL — ABNORMAL HIGH (ref 70–99)
Potassium: 2 mmol/L — CL (ref 3.5–5.1)
Potassium: 2.8 mmol/L — ABNORMAL LOW (ref 3.5–5.1)
Sodium: 135 mmol/L (ref 135–145)
Sodium: 135 mmol/L (ref 135–145)

## 2021-03-20 LAB — CBC
HCT: 44.1 % (ref 36.0–46.0)
Hemoglobin: 14.9 g/dL (ref 12.0–15.0)
MCH: 29.9 pg (ref 26.0–34.0)
MCHC: 33.8 g/dL (ref 30.0–36.0)
MCV: 88.6 fL (ref 80.0–100.0)
Platelets: 260 10*3/uL (ref 150–400)
RBC: 4.98 MIL/uL (ref 3.87–5.11)
RDW: 12.9 % (ref 11.5–15.5)
WBC: 10.7 10*3/uL — ABNORMAL HIGH (ref 4.0–10.5)
nRBC: 0 % (ref 0.0–0.2)

## 2021-03-20 LAB — GLUCOSE, CAPILLARY
Glucose-Capillary: 160 mg/dL — ABNORMAL HIGH (ref 70–99)
Glucose-Capillary: 123 mg/dL — ABNORMAL HIGH (ref 70–99)
Glucose-Capillary: 69 mg/dL — ABNORMAL LOW (ref 70–99)
Glucose-Capillary: 157 mg/dL — ABNORMAL HIGH (ref 70–99)

## 2021-03-20 LAB — HIV ANTIBODY (ROUTINE TESTING W REFLEX): HIV Screen 4th Generation wRfx: NONREACTIVE

## 2021-03-20 LAB — CBG MONITORING, ED
Glucose-Capillary: 403 mg/dL — ABNORMAL HIGH (ref 70–99)
Glucose-Capillary: 454 mg/dL — ABNORMAL HIGH (ref 70–99)

## 2021-03-20 MED ORDER — POTASSIUM CHLORIDE CRYS ER 20 MEQ PO TBCR
40.0000 meq | EXTENDED_RELEASE_TABLET | Freq: Once | ORAL | Status: AC
  Administered 2021-03-20: 05:00:00 40 meq via ORAL
  Filled 2021-03-20: qty 2

## 2021-03-20 MED ORDER — INSULIN LISPRO 200 UNIT/ML ~~LOC~~ SOPN: 10.0000 [IU] | PEN_INJECTOR | Freq: Three times a day (TID) | SUBCUTANEOUS | Status: DC

## 2021-03-20 MED ORDER — POTASSIUM CHLORIDE IN NACL 20-0.9 MEQ/L-% IV SOLN: INTRAVENOUS | Status: DC

## 2021-03-20 MED ORDER — INSULIN ASPART 100 UNIT/ML IJ SOLN
0.0000 [IU] | Freq: Three times a day (TID) | INTRAMUSCULAR | Status: DC
  Administered 2021-03-20: 18:00:00 2 [IU] via SUBCUTANEOUS
  Administered 2021-03-20: 09:00:00 15 [IU] via SUBCUTANEOUS
  Administered 2021-03-20: 15:00:00 3 [IU] via SUBCUTANEOUS
  Administered 2021-03-21 (×2): 11 [IU] via SUBCUTANEOUS
  Filled 2021-03-20: qty 1

## 2021-03-20 MED ORDER — PANTOPRAZOLE SODIUM 40 MG PO TBEC
40.0000 mg | DELAYED_RELEASE_TABLET | Freq: Every day | ORAL | Status: DC
  Administered 2021-03-20: 09:00:00 40 mg via ORAL
  Filled 2021-03-20 (×2): qty 1

## 2021-03-20 MED ORDER — POTASSIUM CHLORIDE 10 MEQ/100ML IV SOLN
10.0000 meq | INTRAVENOUS | Status: AC
  Administered 2021-03-20 (×4): 10 meq via INTRAVENOUS
  Filled 2021-03-20 (×4): qty 100

## 2021-03-20 MED ORDER — ENSURE ENLIVE PO LIQD: 237.0000 mL | Freq: Two times a day (BID) | ORAL | Status: DC

## 2021-03-20 MED ORDER — MAGNESIUM SULFATE 2 GM/50ML IV SOLN
2.0000 g | Freq: Once | INTRAVENOUS | Status: AC
  Administered 2021-03-20: 11:00:00 2 g via INTRAVENOUS
  Filled 2021-03-20: qty 50

## 2021-03-20 MED ORDER — INSULIN GLARGINE-YFGN 100 UNIT/ML ~~LOC~~ SOLN
10.0000 [IU] | Freq: Every day | SUBCUTANEOUS | Status: DC
  Administered 2021-03-20: 01:00:00 10 [IU] via SUBCUTANEOUS
  Filled 2021-03-20 (×4): qty 0.1

## 2021-03-20 MED ORDER — POTASSIUM CHLORIDE CRYS ER 20 MEQ PO TBCR
40.0000 meq | EXTENDED_RELEASE_TABLET | Freq: Three times a day (TID) | ORAL | Status: DC
  Administered 2021-03-20 – 2021-03-21 (×4): 40 meq via ORAL
  Filled 2021-03-20 (×4): qty 2

## 2021-03-20 MED ORDER — INSULIN ASPART 100 UNIT/ML IJ SOLN
10.0000 [IU] | Freq: Three times a day (TID) | INTRAMUSCULAR | Status: DC
  Administered 2021-03-20 – 2021-03-21 (×2): 10 [IU] via SUBCUTANEOUS

## 2021-03-20 NOTE — Progress Notes (Signed)
Inpatient Diabetes Program Recommendations  AACE/ADA: New Consensus Statement on Inpatient Glycemic Control   Target Ranges:  Prepandial:   less than 140 mg/dL      Peak postprandial:   less than 180 mg/dL (1-2 hours)      Critically ill patients:  140 - 180 mg/dL    Latest Reference Range & Units 03/19/21 18:33 03/20/21 00:57 03/20/21 07:42 03/20/21 11:31  Glucose-Capillary 70 - 99 mg/dL 397 (HH) 673 (H) 419 (H) 160 (H)    Latest Reference Range & Units 02/11/21 05:53  Hemoglobin A1C 4.8 - 5.6 % 10.9 (H)   Review of Glycemic Control  Diabetes history: DM2 Outpatient Diabetes medications: Humalog per sliding scale with meals (average 15 units with meals) Current orders for Inpatient glycemic control: Semglee 10 units QHS, Novolog 0-15 units TID with meals, Novolog 10 units TID with meals for meal coverage  Inpatient Diabetes Program Recommendations:    Outpatient: Patient needs to be prescribed basal insulin outpatient. Asked TOC to do benefit check on Lantus SoloStar insulin pens.  If too expensive, may want to consider using Novolin NPH or Novolin 70/30 outpatient.  NOTE: In reviewing chart, noted patient sees Kathlen Brunswick, NP on 11/22/20 and per office note patient reported "Taking Novolog 20 units 15-30 mins prior to 1-2 meals. She has not been using the levemir d/t cost. She was switched to basaglar last visit Unsure how many units she is taking." Patient is prescribed Basaglar 15 units QHS per office note. A1C was 14.6% on 11/22/20 and patient was referred to [facility]. Spoke with patient over the phone regarding DM control. Patient reports she has DM1 which was dx 5-6 years ago (patient states it was confirmed she has DM1) being followed by PCP currently for diabetes management and was recently referred to Endocrinology but does not have an appointment yet but she is planning to call and get an appointment set up. Patient states that she takes Humalog with meals per correction  scale (average about 15 units with meals).  Inquired about basal insulin and patient states that she has been prescribed basal insulin and her copay was over $400 so she can not afford it.  Patient reports is usually 300-500's mg/dl.   Discussed A1C results (10.9% on 02/11/21) and explained that current A1C indicates an average glucose of 266 mg/dl over the past 2-3 months. Discussed glucose and A1C goals. Discussed importance of checking CBGs and maintaining good CBG control to prevent long-term and short-term complications. Explained how hyperglycemia leads to damage within blood vessels which lead to the common complications seen with uncontrolled diabetes. Stressed to the patient the importance of improving glycemic control to prevent further complications from uncontrolled diabetes. Informed patient that TOC would be consulted to see if they can run a benefit check to see how much Lantus SoloStar insulin pens would be (appears that Lantus is preferred basal insulin) to see if it is more affordable. Patient states she prefers insulin pens and would be willing to take basal insulin if it is more affordable. Stressed to patient that DM control needs to be improved. Asked patient to be sure to call Endocrinology to get appointment set up to establish care.   Patient verbalized understanding of information discussed and reports no further questions at this time related to diabetes.  Thanks, Orlando Penner, RN, MSN, CDE Diabetes Coordinator Inpatient Diabetes Program [phone] (Team Pager)

## 2021-03-20 NOTE — ED Notes (Signed)
Pt to room 326

## 2021-03-20 NOTE — ED Notes (Signed)
Provider notified of pts BS 403.

## 2021-03-20 NOTE — Progress Notes (Signed)
Autumn Daniels  INO:[id number] DOB: 12-Oct-1960 DOA: 03/19/2021 PCP: April Manson, NP    Brief Narrative:  60 year old with a history of DM 1, depression, GERD, frequent UTIs, and fibromyalgia who presented to the ED with severe generalized weakness x2 weeks, and 2 days of severe persistent diarrhea.  She completed a telemetry visit 5 days prior to this presentation at which time she was diagnosed with urinary tract infection and started on oral antibiotic therapy.  Significant Events:    Consultants:  None  Code Status: FULL CODE  Antimicrobials:  None  DVT prophylaxis: Lovenox  Subjective: Afebrile.  Vital signs stable.  Saturation 90% on room air.  CBG has been elevated at 403-566 since admission.  Potassium remains severely low at 2.0.  Alert and oriented.  No new complaints.  Is anxious to go home as soon as possible.  Assessment & Plan:  Severe Hypokalemia Due to poor intake and increased GI losses -continue to supplement -monitor magnesium as well  Prolonged QTC Due to severe hypokalemia -recheck EKG when potassium normalized  Diarrhea Likely due to antibiotic therapy for UTI -reports only 1 single episode since admission -follow  Uncontrolled DM1 -hyperglycemia without DKA Resume usual meal coverage from home and monitor - long acting added to regimen as well   Chronic diabetic neuropathy Continue Neurontin  Anxiety disorder Continue lorazepam  Hypothyroidism Continue Synthroid    Family Communication: Spoke with family at bedside Status is: Observation  Objective: Blood pressure 98/64, pulse 81, temperature 98 F (36.7 C), resp. rate 18, height 4\' 11"  (1.499 m), weight 36.6 kg, SpO2 97 %.  Intake/Output Summary (Last 24 hours) at 03/20/2021 1639 Last data filed at 03/20/2021 0915 Gross per 24 hour  Intake 1240 ml  Output --  Net 1240 ml   Filed Weights   03/19/21 1833 03/20/21 0848  Weight: 34.9 kg 36.6 kg    Examination: General: No  acute respiratory distress Lungs: Clear to auscultation bilaterally without wheezes or crackles Cardiovascular: Regular rate and rhythm without murmur gallop or rub normal S1 and S2 Abdomen: Nontender, nondistended, soft, bowel sounds positive, no rebound, no ascites, no appreciable mass Extremities: No significant cyanosis, clubbing, or edema bilateral lower extremities  CBC: Recent Labs  Lab 03/19/21 1914 03/20/21 0357  WBC 13.7* 10.7*  HGB 15.9* 14.9  HCT 46.6* 44.1  MCV 88.1 88.6  PLT 255 260   Basic Metabolic Panel: Recent Labs  Lab 03/19/21 1914 03/20/21 0357 03/20/21 1243  NA 130* 135 135  K <2.0* 2.0* 2.8*  CL 83* 94* 98  CO2 25 30 28   GLUCOSE 554* 477* 113*  BUN 11 7 6   CREATININE 0.75 0.55 <0.30*  CALCIUM 8.8* 8.6* 8.2*  MG 2.1  --   --    GFR: CrCl cannot be calculated (This lab value cannot be used to calculate CrCl because it is not a number: <0.30).  Liver Function Tests: No results for input(s): AST, ALT, ALKPHOS, BILITOT, PROT, ALBUMIN in the last 168 hours. No results for input(s): LIPASE, AMYLASE in the last 168 hours. No results for input(s): AMMONIA in the last 168 hours.   HbA1C: Hemoglobin A1C  Date/Time Value Ref Range Status  08/29/2015 12:00 AM 6.1  Final  05/02/2015 12:00 AM 10.9  Final   Hgb A1c MFr Bld  Date/Time Value Ref Range Status  02/11/2021 05:53 AM 10.9 (H) 4.8 - 5.6 % Final    Comment:    (NOTE) Pre diabetes:  5.7%-6.4%  Diabetes:              >6.4%  Glycemic control for   <7.0% adults with diabetes     CBG: Recent Labs  Lab 03/19/21 1833 03/20/21 0057 03/20/21 0742 03/20/21 1131 03/20/21 1608  GLUCAP 566* 454* 403* 160* 123*    Recent Results (from the past 240 hour(s))  Resp Panel by RT-PCR (Flu A&B, Covid) Nasopharyngeal Swab     Status: None   Collection Time: 03/19/21  8:23 PM   Specimen: Nasopharyngeal Swab; Nasopharyngeal(NP) swabs in vial transport medium  Result Value Ref Range Status    SARS Coronavirus 2 by RT PCR NEGATIVE NEGATIVE Final    Comment: (NOTE) SARS-CoV-2 target nucleic acids are NOT DETECTED.  The SARS-CoV-2 RNA is generally detectable in upper respiratory specimens during the acute phase of infection. The lowest concentration of SARS-CoV-2 viral copies this assay can detect is 138 copies/mL. A negative result does not preclude SARS-Cov-2 infection and should not be used as the sole basis for treatment or other patient management decisions. A negative result may occur with  improper specimen collection/handling, submission of specimen other than nasopharyngeal swab, presence of viral mutation(s) within the areas targeted by this assay, and inadequate number of viral copies(<138 copies/mL). A negative result must be combined with clinical observations, patient history, and epidemiological information. The expected result is Negative.  Fact Sheet for Patients:  BloggerCourse.com  Fact Sheet for Healthcare Providers:  SeriousBroker.it  This test is no t yet approved or cleared by the [place] FDA and  has been authorized for detection and/or diagnosis of SARS-CoV-2 by FDA under an Emergency Use Authorization (EUA). This EUA will remain  in effect (meaning this test can be used) for the duration of the COVID-19 declaration under Section 564(b)(1) of the Act, 21 U.S.C.section 360bbb-3(b)(1), unless the authorization is terminated  or revoked sooner.       Influenza A by PCR NEGATIVE NEGATIVE Final   Influenza B by PCR NEGATIVE NEGATIVE Final    Comment: (NOTE) The Xpert Xpress SARS-CoV-2/FLU/RSV plus assay is intended as an aid in the diagnosis of influenza from Nasopharyngeal swab specimens and should not be used as a sole basis for treatment. Nasal washings and aspirates are unacceptable for Xpert Xpress SARS-CoV-2/FLU/RSV testing.  Fact Sheet for  Patients: BloggerCourse.com  Fact Sheet for Healthcare Providers: SeriousBroker.it  This test is not yet approved or cleared by the [place] FDA and has been authorized for detection and/or diagnosis of SARS-CoV-2 by FDA under an Emergency Use Authorization (EUA). This EUA will remain in effect (meaning this test can be used) for the duration of the COVID-19 declaration under Section 564(b)(1) of the Act, 21 U.S.C. section 360bbb-3(b)(1), unless the authorization is terminated or revoked.  Performed at [facility], [address], [place], [place] [address]      Scheduled Meds:  enoxaparin (LOVENOX) injection  30 mg Subcutaneous Q24H   feeding supplement  237 mL Oral BID BM   gabapentin  400 mg Oral TID   insulin aspart  0-15 Units Subcutaneous TID WC   insulin aspart  10 Units Subcutaneous TID WC   insulin glargine-yfgn  10 Units Subcutaneous Daily   levothyroxine  137 mcg Oral Q0600   pantoprazole  40 mg Oral Daily   potassium chloride  40 mEq Oral TID   rOPINIRole  1 mg Oral QHS   Continuous Infusions:  0.9 % NaCl with KCl 20 mEq / L 100 mL/hr at  03/20/21 1118     LOS: 0 days   Lonia Blood, MD [facility] Office  [phone] Pager - Text Page per Amion  If 7PM-7AM, please contact night-coverage per Amion 03/20/2021, 4:39 PM

## 2021-03-20 NOTE — Evaluation (Signed)
Occupational Therapy Evaluation Patient Details Name: Autumn Daniels MRN: [id number] DOB: January 17, 1961 Today's Date: 03/20/2021   History of Present Illness Autumn Daniels is a 60 y.o. female with medical history significant of  T1 DM, depression, GERD, frequent UTI, and fibromyalgia who presented with generalized weakness.  Patient reported 2 weeks of progressive and worsening generalized weakness.  5 days ago she had a televisit, where she was diagnosed with urinary tract infection and received oral antibiotic therapy.  Her symptoms did not improve, and for the last 2 days she had developed severe and persistent diarrhea, with no improving or worsening factors, no associated abdominal pain, nausea or vomiting.  She reports continue using her insulin therapy.   Clinical Impression   Pt agreeable to OT/PT co-evaluation. Pt appears to be operating at or near baseline levels. Pt able to complete bed and functional mobility without assist. Pt demonstrated mild balance deficits initially in walking but without need of AD. Pt was able to doff and don socks without assist seated at EOB. Pt is generally weak but reported no interest in home health. Pt is not recommended for further acute OT services and will be discharged to care of nursing staff for remaining length of stay.      Recommendations for follow up therapy are one component of a multi-disciplinary discharge planning process, led by the attending physician.  Recommendations may be updated based on patient status, additional functional criteria and insurance authorization.   Follow Up Recommendations  No OT follow up    Assistance Recommended at Discharge None  Functional Status Assessment  Patient has not had a recent decline in their functional status  Equipment Recommendations  None recommended by OT    Recommendations for Other Services       Precautions / Restrictions Precautions Precautions: Fall Restrictions Weight Bearing  Restrictions: No      Mobility Bed Mobility Overal bed mobility: Independent                  Transfers Overall transfer level: Independent                 General transfer comment: Mildly unsteady initially. No AD used.      Balance Overall balance assessment: Mild deficits observed, not formally tested                                         ADL either performed or assessed with clinical judgement   ADL Overall ADL's : Independent                                       General ADL Comments: Pt able to doff and don socks without difficulty. Mild unsteady during transfers but no assist needed.     Vision Baseline Vision/History: 1 Wears glasses Ability to See in Adequate Light: 1 Impaired Patient Visual Report: No change from baseline Vision Assessment?: No apparent visual deficits                Pertinent Vitals/Pain Pain Assessment: 0-10 Pain Score: 7  Pain Location: L knee Pain Descriptors / Indicators: Throbbing Pain Intervention(s): Limited activity within patient's tolerance;Monitored during session;Repositioned     Hand Dominance Right   Extremity/Trunk Assessment Upper Extremity Assessment Upper Extremity Assessment: Generalized weakness   Lower Extremity Assessment  Lower Extremity Assessment: Defer to PT evaluation   Cervical / Trunk Assessment Cervical / Trunk Assessment: Normal   Communication Communication Communication: No difficulties   Cognition Arousal/Alertness: Awake/alert Behavior During Therapy: WFL for tasks assessed/performed Overall Cognitive Status: Within Functional Limits for tasks assessed                                                        Home Living Family/patient expects to be discharged to:: Private residence Living Arrangements: Alone Available Help at Discharge:  (No help) Type of Home: House Home Access: Stairs to enter [employer] of Steps: 8 Entrance Stairs-Rails: Can reach both;Left;Right Home Layout: Two level Alternate Level Stairs-Number of Steps: 13 Alternate Level Stairs-Rails: Right;Left;Can reach both Bathroom Shower/Tub: Tub/shower unit;Walk-in shower   Bathroom Toilet: Standard     Home Equipment: None          Prior Functioning/Environment Prior Level of Function : Independent/Modified Independent             Mobility Comments: Pt ambulates without AD. ADLs Comments: Pt indepndent for ADL's and IADL's                      OT Goals(Current goals can be found in the care plan section) Acute Rehab OT Goals Patient Stated Goal: return home                   Co-evaluation PT/OT/SLP Co-Evaluation/Treatment: Yes Reason for Co-Treatment: To address functional/ADL transfers   OT goals addressed during session: ADL's and self-care      AM-PAC OT "6 Clicks" Daily Activity     Outcome Measure Help from another person eating meals?: None Help from another person taking care of personal grooming?: None Help from another person toileting, which includes using toliet, bedpan, or urinal?: None Help from another person bathing (including washing, rinsing, drying)?: None Help from another person to put on and taking off regular upper body clothing?: None Help from another person to put on and taking off regular lower body clothing?: None 6 Click Score: 24   End of Session    Activity Tolerance: Patient tolerated treatment well Patient left: in bed;with call bell/phone within reach  OT Visit Diagnosis: Unsteadiness on feet (R26.81);Muscle weakness (generalized) (M62.81)                Time: 4235-3614 OT Time Calculation (min): 17 min Charges:  OT General Charges $OT Visit: 1 Visit OT Evaluation $OT Eval Low Complexity: 1 Low  Autumn Daniels OT, MOT  Autumn Daniels 03/20/2021, 10:48 AM

## 2021-03-20 NOTE — ED Notes (Signed)
Nurse unable to take report at this time will call back

## 2021-03-20 NOTE — Evaluation (Signed)
Physical Therapy Evaluation Patient Details Name: Autumn Daniels MRN: [id number] DOB: 26-Apr-1960 Today's Date: 03/20/2021  History of Present Illness  Autumn Daniels is a 60 y.o. female with medical history significant of  T1 DM, depression, GERD, frequent UTI, and fibromyalgia who presented with generalized weakness.  Patient reported 2 weeks of progressive and worsening generalized weakness.  5 days ago she had a televisit, where she was diagnosed with urinary tract infection and received oral antibiotic therapy.  Her symptoms did not improve, and for the last 2 days she had developed severe and persistent diarrhea, with no improving or worsening factors, no associated abdominal pain, nausea or vomiting.  She reports continue using her insulin therapy.   Clinical Impression  Patient functioning near baseline for functional mobility and gait demonstrating good return for ambulation in room, hallways and going up/down stairs without loss of balance.  Patient encouraged to ambulate in room ad lib and with nursing staff in hallways.  Plan:  Patient discharged from physical therapy to care of nursing for ambulation daily as tolerated for length of stay.         Recommendations for follow up therapy are one component of a multi-disciplinary discharge planning process, led by the attending physician.  Recommendations may be updated based on patient status, additional functional criteria and insurance authorization.  Follow Up Recommendations No PT follow up    Assistance Recommended at Discharge PRN  Functional Status Assessment Patient has not had a recent decline in their functional status  Equipment Recommendations  None recommended by PT    Recommendations for Other Services       Precautions / Restrictions Precautions Precautions: Fall Restrictions Weight Bearing Restrictions: No      Mobility  Bed Mobility Overal bed mobility: Independent                  Transfers Overall  transfer level: Independent                      Ambulation/Gait Ambulation/Gait assistance: Modified independent (Device/Increase time) Gait Distance (Feet): 100 Feet Assistive device: None Gait Pattern/deviations: WFL(Within Functional Limits) Gait velocity: slightly decreased     General Gait Details: grossly WFL demonstrating good return for ambulation in room and hallways without loss of balance and without need for an AD  Stairs Stairs: Yes Stairs assistance: Modified independent (Device/Increase time);Supervision Stair Management: Step to pattern;One rail Right;One rail Left Number of Stairs: 5 General stair comments: demonstrates good return for going up/down steps in stairwell without loss of balance  Wheelchair Mobility    Modified Rankin (Stroke Patients Only)       Balance Overall balance assessment: Mild deficits observed, not formally tested                                           Pertinent Vitals/Pain Pain Assessment: 0-10 Pain Score: 7  Pain Location: L knee Pain Descriptors / Indicators: Throbbing Pain Intervention(s): Limited activity within patient's tolerance;Monitored during session;Repositioned    Home Living Family/patient expects to be discharged to:: Private residence Living Arrangements: Alone Available Help at Discharge: Family;Available PRN/intermittently Type of Home: House Home Access: Stairs to enter Entrance Stairs-Rails: Right;Left;Can reach both Entrance Stairs-Number of Steps: 8 Alternate Level Stairs-Number of Steps: 13 Home Layout: Two level Home Equipment: None      Prior Function Prior Level of Function :  Independent/Modified Independent             Mobility Comments: Tourist information centre manager, drives ADLs Comments: Pt indepndent for ADL's and IADL's     Hand Dominance   Dominant Hand: Right    Extremity/Trunk Assessment   Upper Extremity Assessment Upper Extremity Assessment: Defer to  OT evaluation    Lower Extremity Assessment Lower Extremity Assessment: Overall WFL for tasks assessed    Cervical / Trunk Assessment Cervical / Trunk Assessment: Normal  Communication   Communication: No difficulties  Cognition Arousal/Alertness: Awake/alert Behavior During Therapy: WFL for tasks assessed/performed Overall Cognitive Status: Within Functional Limits for tasks assessed                                          General Comments      Exercises     Assessment/Plan    PT Assessment Patient does not need any further PT services  PT Problem List         PT Treatment Interventions      PT Goals (Current goals can be found in the Care Plan section)  Acute Rehab PT Goals Patient Stated Goal: return home PT Goal Formulation: With patient Time For Goal Achievement: 03/20/21 Potential to Achieve Goals: Good    Frequency     Barriers to discharge        Co-evaluation PT/OT/SLP Co-Evaluation/Treatment: Yes Reason for Co-Treatment: To address functional/ADL transfers PT goals addressed during session: Mobility/safety with mobility;Balance;Proper use of DME         AM-PAC PT "6 Clicks" Mobility  Outcome Measure Help needed turning from your back to your side while in a flat bed without using bedrails?: None Help needed moving from lying on your back to sitting on the side of a flat bed without using bedrails?: None Help needed moving to and from a bed to a chair (including a wheelchair)?: None Help needed standing up from a chair using your arms (e.g., wheelchair or bedside chair)?: None Help needed to walk in hospital room?: None Help needed climbing 3-5 steps with a railing? : A Little 6 Click Score: 23    End of Session   Activity Tolerance: Patient tolerated treatment well Patient left: in bed;with call bell/phone within reach Nurse Communication: Mobility status PT Visit Diagnosis: Unsteadiness on feet (R26.81);Other  abnormalities of gait and mobility (R26.89);Muscle weakness (generalized) (M62.81)    Time: 4854-6270 PT Time Calculation (min) (ACUTE ONLY): 20 min   Charges:   PT Evaluation $PT Eval Moderate Complexity: 1 Mod PT Treatments $Gait Training: 8-22 mins        2:04 PM, 03/20/21 Ocie Bob, MPT Physical Therapist with [facility] [phone] office [phone] mobile phone

## 2021-03-20 NOTE — Progress Notes (Signed)
Pt arrived to room #326 via stretcher from ED. Oriented to room and safety procedures, states understanding. VSS, denies any c/o at present. IVF infusing without s/s infiltration.

## 2021-03-20 NOTE — ED Notes (Signed)
Pt removed purewick and voided in the bed and in a juice cup on the bedside table, unable to obtain clean catch at this time

## 2021-03-21 ENCOUNTER — Encounter (HOSPITAL_COMMUNITY): Payer: Self-pay | Admitting: Internal Medicine

## 2021-03-21 DIAGNOSIS — E43 Unspecified severe protein-calorie malnutrition: Secondary | ICD-10-CM | POA: Diagnosis present

## 2021-03-21 LAB — BASIC METABOLIC PANEL
Anion gap: 7 (ref 5–15)
Anion gap: 5 (ref 5–15)
BUN: 5 mg/dL — ABNORMAL LOW (ref 6–20)
BUN: <5 mg/dL — ABNORMAL LOW (ref 6–20)
CO2: 32 mmol/L (ref 22–32)
CO2: 28 mmol/L (ref 22–32)
Calcium: 8.7 mg/dL — ABNORMAL LOW (ref 8.9–10.3)
Calcium: 8.4 mg/dL — ABNORMAL LOW (ref 8.9–10.3)
Chloride: 101 mmol/L (ref 98–111)
Chloride: 102 mmol/L (ref 98–111)
Creatinine, Ser: 0.43 mg/dL — ABNORMAL LOW (ref 0.44–1.00)
Creatinine, Ser: 0.43 mg/dL — ABNORMAL LOW (ref 0.44–1.00)
GFR, Estimated: >60 mL/min (ref >60)
GFR, Estimated: >60 mL/min (ref >60)
Glucose, Bld: 276 mg/dL — ABNORMAL HIGH (ref 70–99)
Glucose, Bld: 232 mg/dL — ABNORMAL HIGH (ref 70–99)
Potassium: 3.9 mmol/L (ref 3.5–5.1)
Potassium: 3.5 mmol/L (ref 3.5–5.1)
Sodium: 140 mmol/L (ref 135–145)
Sodium: 135 mmol/L (ref 135–145)

## 2021-03-21 LAB — GLUCOSE, CAPILLARY
Glucose-Capillary: 307 mg/dL — ABNORMAL HIGH (ref 70–99)
Glucose-Capillary: 306 mg/dL — ABNORMAL HIGH (ref 70–99)

## 2021-03-21 LAB — MAGNESIUM: Magnesium: 2.4 mg/dL (ref 1.7–2.4)

## 2021-03-21 MED ORDER — INSULIN GLARGINE-YFGN 100 UNIT/ML ~~LOC~~ SOLN
10.0000 [IU] | Freq: Every day | SUBCUTANEOUS | Status: DC
Start: 2021-03-21 — End: 2021-03-21
  Filled 2021-03-21 (×2): qty 0.1

## 2021-03-21 MED ORDER — INSULIN GLARGINE 100 UNIT/ML SOLOSTAR PEN: 10.0000 [IU] | PEN_INJECTOR | Freq: Every day | SUBCUTANEOUS | 11 refills | Status: DC

## 2021-03-21 MED ORDER — ADULT MULTIVITAMIN W/MINERALS CH: 1.0000 | ORAL_TABLET | Freq: Every day | ORAL | Status: DC

## 2021-03-21 MED ORDER — POTASSIUM CHLORIDE CRYS ER 20 MEQ PO TBCR: 20.0000 meq | EXTENDED_RELEASE_TABLET | Freq: Three times a day (TID) | ORAL | 0 refills | Status: DC

## 2021-03-21 MED ORDER — GLUCERNA SHAKE PO LIQD: 237.0000 mL | Freq: Three times a day (TID) | ORAL | Status: DC

## 2021-03-21 MED ORDER — ENSURE MAX PROTEIN PO LIQD
11.0000 [oz_av] | Freq: Every day | ORAL | Status: DC
Start: 2021-03-21 — End: 2021-03-21

## 2021-03-21 MED ORDER — POTASSIUM CHLORIDE CRYS ER 20 MEQ PO TBCR: 40.0000 meq | EXTENDED_RELEASE_TABLET | Freq: Three times a day (TID) | ORAL | 0 refills | Status: DC

## 2021-03-21 MED ORDER — INSULIN PEN NEEDLE 32G X 4 MM MISC: 10.0000 [IU] | Freq: Every day | 11 refills | Status: DC

## 2021-03-21 NOTE — TOC Benefit Eligibility Note (Signed)
Transition of Care [facility]) Benefit Eligibility Note    Patient Details  Name: Autumn Daniels MRN: [id number] Date of Birth: September 29, 1960   Medication/Dose: Lantus Solostar insulin pens 1,000 units  Covered?: Yes  Tier: 3 Drug  Prescription Coverage Preferred Pharmacy: [facility] preferred,  [facility] and [facility] (in chart) will also work.  Spoke with Person/Company/Phone Number:: Greggory Stallion with [employer] at [phone]  Co-Pay: $35 estimated copay for 30 day retail & $95 estimated copay for 90 day retail  Prior Approval: No  Deductible:  (Will have a $160 deductible in 2023. Per rep, will not apply towards Tier 3 medications.)    Johnell Comings Phone Number: [phone] or [phone] 03/21/2021, 8:47 AM

## 2021-03-21 NOTE — Progress Notes (Signed)
Initial Nutrition Assessment  DOCUMENTATION CODES:  Severe malnutrition in context of chronic illness, Underweight  INTERVENTION:  Discontinue Ensure BID.  Add Glucerna Shake po TID, each supplement provides 220 kcal and 10 grams of protein.  Add Ensure Max po daily, each supplement provides 150 kcal and 30 grams of protein.    Add MVI with minerals daily.  Encourage PO and supplement intake.  NUTRITION DIAGNOSIS:  Severe Malnutrition related to chronic illness (uncontrolled T1DM) as evidenced by severe fat depletion, severe muscle depletion.  GOAL:  Patient will meet greater than or equal to 90% of their needs  MONITOR:  PO intake, Supplement acceptance, Labs, Weight trends, I & O's  REASON FOR ASSESSMENT:  Consult Assessment of nutrition requirement/status  ASSESSMENT:  60 yo female with a PMH of DM 1, depression, GERD, frequent UTIs, and fibromyalgia who presented to the ED with severe generalized weakness x2 weeks, and 2 days of severe persistent diarrhea.  She completed a telemetry visit 5 days prior to this presentation at which time she was diagnosed with urinary tract infection and started on oral antibiotic therapy. Admitted with hypokalemia.  Spoke with pt at bedside. Pt reports feeling very hungry, only slightly touching her breakfast per RD observation.  She was very focused on ordering breakfast, not engaging much with RD during interview.   Per Epic, pt's weight has been stable over the past almost 3 years.  Pt refusing Ensure. Prefers Glucerna. RD to order Glucerna TID and Ensure Max daily, as well as MVI with minerals.  Attached "Carbohydrate Counting for People with Diabetes" handout from the Academy of Nutrition and Dietetics to patient's discharge instructions.  Supplements: Ensure BID (refusing)  Medications: reviewed; SSI, Semglee, Synthroid, Protonix, Klor-Con 40 mEq  Labs: reviewed; CBG 69-403 HbA1c: 10.9% (02/11/2021)  NUTRITION - FOCUSED  PHYSICAL EXAM: Flowsheet Row Most Recent Value  Orbital Region Severe depletion  Upper Arm Region Severe depletion  Thoracic and Lumbar Region Severe depletion  Buccal Region Severe depletion  Temple Region Severe depletion  Clavicle Bone Region Severe depletion  Clavicle and Acromion Bone Region Severe depletion  Scapular Bone Region Severe depletion  Dorsal Hand Severe depletion  Patellar Region Severe depletion  Anterior Thigh Region Severe depletion  Posterior Calf Region Severe depletion  Edema (RD Assessment) None  Hair Reviewed  Eyes Reviewed  Mouth Reviewed  Skin Reviewed  Nails Reviewed   Diet Order:   Diet Order             Diet Carb Modified Fluid consistency: Thin; Room service appropriate? Yes with Assist  Diet effective now                  EDUCATION NEEDS:  Education needs have been addressed  Skin:  Skin Assessment: Reviewed RN Assessment  Last BM:  03/20/21  Height:  Ht Readings from Last 1 Encounters:  03/20/21 4\' 11"  (1.499 m)   Weight:  Wt Readings from Last 1 Encounters:  03/20/21 36.6 kg   BMI:  Body mass index is 16.3 kg/m.  Estimated Nutritional Needs:  Kcal:  1750-1950 Protein:  55-70 grams Fluid:  >1.75 L  03/22/21, RD, LDN (she/her/hers) Clinical Inpatient Dietitian RD Pager/After-Hours/Weekend Pager # in [place]

## 2021-03-21 NOTE — Progress Notes (Signed)
Inpatient Diabetes Program Recommendations  AACE/ADA: New Consensus Statement on Inpatient Glycemic Control (2015)  Target Ranges:  Prepandial:   less than 140 mg/dL      Peak postprandial:   less than 180 mg/dL (1-2 hours)      Critically ill patients:  140 - 180 mg/dL   Lab Results  Component Value Date   GLUCAP 307 (H) 03/21/2021   HGBA1C 10.9 (H) 02/11/2021    Review of Glycemic Control  Latest Reference Range & Units 03/20/21 07:42 03/20/21 11:31 03/20/21 16:08 03/20/21 21:29 03/20/21 23:10 03/21/21 07:41  Glucose-Capillary 70 - 99 mg/dL 643 (H) 329 (H) 518 (H) 69 (L) 157 (H) 307 (H)  (H): Data is abnormally high (L): Data is abnormally low  Diabetes history: DM2 Outpatient Diabetes medications: Humalog per sliding scale with meals (average 15 units with meals) Current orders for Inpatient glycemic control: Semglee 10 units QHS, Novolog 0-15 units TID with meals, Novolog 10 units TID with meals for meal coverage    Inpatient Diabetes Program Recommendations:    Please administer Semglee 10 units now Decrease correction to Novolog 0-9 units TID   Basal was held last night at 9pm for a CBG of 69 mg/dl and glucose is 841 mg/dl this morning.  Glucose was 69 mg/dl likely due to over correction.    Will continue to follow while inpatient.  Thank you, Dulce Sellar, RN, BSN Diabetes Coordinator Inpatient Diabetes Program [phone] (team pager from 8a-5p)

## 2021-03-21 NOTE — Discharge Instructions (Addendum)
You are being sent home on a temporarily increased dose of potassium replacement. You will take of Potassium 3 times a day each day through the last dose on 03/24/2021. On 03/25/2021 you will resume your prior potassium dose of 3 times a day.   It is very important that you have your potassium level checked in 3-4 days.    Carbohydrate Counting For People With Diabetes  Foods with carbohydrates make your blood glucose level go up. Learning how to count carbohydrates can help you control your blood glucose levels. First, identify the foods you eat that contain carbohydrates. Then, using the Foods with Carbohydrates chart, determine about how much carbohydrates are in your meals and snacks. Make sure you are eating foods with fiber, protein, and healthy fat along with your carbohydrate foods. Foods with Carbohydrates The following table shows carbohydrate foods that have about 15 grams of carbohydrate each. Using measuring cups, spoons, or a food scale when you first begin learning about carbohydrate counting can help you learn about the portion sizes you typically eat. The following foods have 15 grams carbohydrate each:  Grains 1 slice bread (1 ounce)  1 small tortilla (6-inch size)   large bagel (1 ounce)  1/3 cup pasta or rice (cooked)   hamburger or hot dog bun ( ounce)   cup cooked cereal   to  cup ready-to-eat cereal  2 taco shells (5-inch size) Fruit 1 small fresh fruit ( to 1 cup)   medium banana  17 small grapes (3 ounces)  1 cup melon or berries   cup canned or frozen fruit  2 tablespoons dried fruit (blueberries, cherries, cranberries, raisins)   cup unsweetened fruit juice  Starchy Vegetables  cup cooked beans, peas, corn, potatoes/sweet potatoes   large baked potato (3 ounces)  1 cup acorn or butternut squash  Snack Foods 3 to 6 crackers  8 potato chips or 13 tortilla chips ( ounce to 1 ounce)  3 cups popped popcorn  Dairy 3/4 cup (6 ounces) nonfat  plain yogurt, or yogurt with sugar-free sweetener  1 cup milk  1 cup plain rice, soy, coconut or flavored almond milk Sweets and Desserts  cup ice cream or frozen yogurt  1 tablespoon jam, jelly, pancake syrup, table sugar, or honey  2 tablespoons light pancake syrup  1 inch square of frosted cake or 2 inch square of unfrosted cake  2 small cookies (2/3 ounce each) or  large cookie  Sometimes youll have to estimate carbohydrate amounts if you dont know the exact recipe. One cup of mixed foods like soups can have 1 to 2 carbohydrate servings, while some casseroles might have 2 or more servings of carbohydrate. Foods that have less than 20 calories in each serving can be counted as free foods. Count 1 cup raw vegetables, or  cup cooked non-starchy vegetables as free foods. If you eat 3 or more servings at one meal, then count them as 1 carbohydrate serving.  Foods without Carbohydrates  Not all foods contain carbohydrates. Meat, some dairy, fats, non-starchy vegetables, and many beverages dont contain carbohydrate. So when you count carbohydrates, you can generally exclude chicken, pork, beef, fish, seafood, eggs, tofu, cheese, butter, sour cream, avocado, nuts, seeds, olives, mayonnaise, water, black coffee, unsweetened tea, and zero-calorie drinks. Vegetables with no or low carbohydrate include green beans, cauliflower, tomatoes, and onions. How much carbohydrate should I eat at each meal?  Carbohydrate counting can help you plan your meals and manage your weight.  Following are some starting points for carbohydrate intake at each meal. Work with your registered dietitian nutritionist to find the best range that works for your blood glucose and weight.   To Lose Weight To Maintain Weight  Women 2 - 3 carb servings 3 - 4 carb servings  Men 3 - 4 carb servings 4 - 5 carb servings  Checking your blood glucose after meals will help you know if you need to adjust the timing, type, or number of  carbohydrate servings in your meal plan. Achieve and keep a healthy body weight by balancing your food intake and physical activity.  Tips How should I plan my meals?  Plan for half the food on your plate to include non-starchy vegetables, like salad greens, broccoli, or carrots. Try to eat 3 to 5 servings of non-starchy vegetables every day. Have a protein food at each meal. Protein foods include chicken, fish, meat, eggs, or beans (note that beans contain carbohydrate). These two food groups (non-starchy vegetables and proteins) are low in carbohydrate. If you fill up your plate with these foods, you will eat less carbohydrate but still fill up your stomach. Try to limit your carbohydrate portion to  of the plate.  What fats are healthiest to eat?  Diabetes increases risk for heart disease. To help protect your heart, eat more healthy fats, such as olive oil, nuts, and avocado. Eat less saturated fats like butter, cream, and high-fat meats, like bacon and sausage. Avoid trans fats, which are in all foods that list partially hydrogenated oil as an ingredient. What should I drink?  Choose drinks that are not sweetened with sugar. The healthiest choices are water, carbonated or seltzer waters, and tea and coffee without added sugars.  Sweet drinks will make your blood glucose go up very quickly. One serving of soda or energy drink is  cup. It is best to drink these beverages only if your blood glucose is low.  Artificially sweetened, or diet drinks, typically do not increase your blood glucose if they have zero calories in them. Read labels of beverages, as some diet drinks do have carbohydrate and will raise your blood glucose. Label Reading Tips Read Nutrition Facts labels to find out how many grams of carbohydrate are in a food you want to eat. Dont forget: sometimes serving sizes on the label arent the same as how much food you are going to eat, so you may need to calculate how much carbohydrate  is in the food you are serving yourself.   Carbohydrate Counting for People with Diabetes Sample 1-Day Menu  Breakfast  cup yogurt, low fat, low sugar (1 carbohydrate serving)   cup cereal, ready-to-eat, unsweetened (1 carbohydrate serving)  1 cup strawberries (1 carbohydrate serving)   cup almonds ( carbohydrate serving)  Lunch 1, 5 ounce can chunk light tuna  2 ounces cheese, low fat cheddar  6 whole wheat crackers (1 carbohydrate serving)  1 small apple (1 carbohydrate servings)   cup carrots ( carbohydrate serving)   cup snap peas  1 cup 1% milk (1 carbohydrate serving)   Evening Meal Stir fry made with: 3 ounces chicken  1 cup brown rice (3 carbohydrate servings)   cup broccoli ( carbohydrate serving)   cup green beans   cup onions  1 tablespoon olive oil  2 tablespoons teriyaki sauce ( carbohydrate serving)  Evening Snack 1 extra small banana (1 carbohydrate serving)  1 tablespoon peanut butter   Carbohydrate Counting for People with Diabetes  Vegan Sample 1-Day Menu  Breakfast 1 cup cooked oatmeal (2 carbohydrate servings)   cup blueberries (1 carbohydrate serving)  2 tablespoons flaxseeds  1 cup soymilk fortified with calcium and vitamin D  1 cup coffee  Lunch 2 slices whole wheat bread (2 carbohydrate servings)   cup baked tofu   cup lettuce  2 slices tomato  2 slices avocado   cup baby carrots ( carbohydrate serving)  1 orange (1 carbohydrate serving)  1 cup soymilk fortified with calcium and vitamin D   Evening Meal Burrito made with: 1 6-inch corn tortilla (1 carbohydrate serving)  1 cup refried vegetarian beans (2 carbohydrate servings)   cup chopped tomatoes   cup lettuce   cup salsa  1/3 cup brown rice (1 carbohydrate serving)  1 tablespoon olive oil for rice   cup zucchini   Evening Snack 6 small whole grain crackers (1 carbohydrate serving)  2 apricots ( carbohydrate serving)   cup unsalted peanuts ( carbohydrate serving)     Carbohydrate Counting for People with Diabetes Vegetarian (Lacto-Ovo) Sample 1-Day Menu  Breakfast 1 cup cooked oatmeal (2 carbohydrate servings)   cup blueberries (1 carbohydrate serving)  2 tablespoons flaxseeds  1 egg  1 cup 1% milk (1 carbohydrate serving)  1 cup coffee  Lunch 2 slices whole wheat bread (2 carbohydrate servings)  2 ounces low-fat cheese   cup lettuce  2 slices tomato  2 slices avocado   cup baby carrots ( carbohydrate serving)  1 orange (1 carbohydrate serving)  1 cup unsweetened tea  Evening Meal Burrito made with: 1 6-inch corn tortilla (1 carbohydrate serving)   cup refried vegetarian beans (1 carbohydrate serving)   cup tomatoes   cup lettuce   cup salsa  1/3 cup brown rice (1 carbohydrate serving)  1 tablespoon olive oil for rice   cup zucchini  1 cup 1% milk (1 carbohydrate serving)  Evening Snack 6 small whole grain crackers (1 carbohydrate serving)  2 apricots ( carbohydrate serving)   cup unsalted peanuts ( carbohydrate serving)    Copyright 2020  Academy of Nutrition and Dietetics. All rights reserved.  Using Nutrition Labels: Carbohydrate  Serving Size  Look at the serving size. All the information on the label is based on this portion. Servings Per Container  The number of servings contained in the package. Guidelines for Carbohydrate  Look at the total grams of carbohydrate in the serving size.  1 carbohydrate choice = 15 grams of carbohydrate. Range of Carbohydrate Grams Per Choice  Carbohydrate Grams/Choice Carbohydrate Choices  6-10   11-20 1  21-25 1  26-35 2  36-40 2  41-50 3  51-55 3  56-65 4  66-70 4  71-80 5    Copyright 2020  Academy of Nutrition and Dietetics. All rights reserved.

## 2021-03-21 NOTE — Discharge Summary (Signed)
DISCHARGE SUMMARY  Autumn Daniels  MR#: [id number]  DOB:1961-02-06  Date of Admission: 03/19/2021 Date of Discharge: 03/21/2021  Attending Physician:Ruqayyah Lute Silvestre Gunner, MD  Patient's GYF:VCBSW, Bonnell Public, NP  Consults: none   Disposition: D/C home   Follow-up Appts:  Follow-up Information     April Manson, NP Follow up.   Specialty: Family Medicine Contact information: [address] [place] [address] [phone]                 Tests Needing Follow-up: -f/u of K+ and CBG trend is required in 3-4 days (order placed for appointment to be scheduled prior to d/c)  Discharge Diagnoses: Severe Hypokalemia Prolonged QTC Diarrhea Uncontrolled DM1 -hyperglycemia without DKA Chronic diabetic neuropathy Anxiety disorder Hypothyroidism  Initial presentation: 60 year old with a history of DM 1, depression, GERD, frequent UTIs, and fibromyalgia who presented to the ED with severe generalized weakness x2 weeks, and 2 days of severe persistent diarrhea.  She completed a telemetry visit 5 days prior to this presentation at which time she was diagnosed with urinary tract infection and started on oral antibiotic therapy.  Hospital Course:  Severe Hypokalemia Due to poor intake and increased GI losses, compounding what appears to be a chronic issue - corrected with supplementation to 3.5-3.7 - continue increased supplementation at TID through 03/24/2021, then revert back to her prior dose of TID - short and long term monitoring will be required    Prolonged QTC Due to severe hypokalemia - corrected to normal w/ K+ normalization    Diarrhea Likely due to antibiotic therapy for UTI - resolved shortly after admission/stopping abx    Uncontrolled DM1 -hyperglycemia without DKA Resumed usual meal coverage from home - long acting added to regimen as well, and CSW confirmed pt should be able to get the Lantus Solostar pen for $35/month w/ her insurance -  discussed with patient    Chronic diabetic neuropathy Continue Neurontin - experienced exacerbation during hospital stay - instructed patient to follow up with her PCP for consideration of further medication titration as it would not be appropriate for me to adjust her pain meds just prior to sending her home and w/o the ability to follow up on her myself    Anxiety disorder Continue lorazepam w/o change    Hypothyroidism Continue Synthroid    Allergies as of 03/21/2021       Reactions   Penicillins Other (See Comments)   Did it involve swelling of the face/tongue/throat, SOB, or low BP? Yes Did it involve sudden or severe rash/hives, skin peeling, or any reaction on the inside of your mouth or nose? Yes Did you need to seek medical attention at a hospital or doctor's office? Yes When did it last happen?      60 years old If all above answers are "NO", may proceed with cephalosporin use. 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   gabapentin 400 MG capsule Commonly known as:  Neurontin Take 1 capsule (400 mg total) by mouth 3 (three) times daily.   HYDROcodone-acetaminophen 5-325 MG tablet Commonly known as: NORCO/VICODIN One tablet every six hours for pain.  Limit 7 days. What changed:  how much to take how to take this when to take this reasons to take this additional instructions   insulin glargine 100 UNIT/ML Solostar Pen Commonly known as: LANTUS Inject 10 Units into the skin daily.    insulin lispro 200 UNIT/ML KwikPen Commonly known as: HUMALOG Inject 10 Units into the skin 3 (three) times daily with meals. Increase amount as directed per personal sliding scale instructions   Insulin Pen Needle 32G X 4 MM Misc 10 Units by Does not apply route daily.   levothyroxine 137 MCG tablet Commonly known as: SYNTHROID Take 1 tablet by mouth every morning.   LORazepam 0.5 MG tablet Commonly known as: ATIVAN Take 0.5 mg by mouth every 8 (eight) hours as needed. for anxiety   potassium chloride SA 20 MEQ tablet Commonly known as: KLOR-CON M Take 2 tablets (40 mEq total) by mouth 3 (three) times daily for 4 days. What changed: You were already taking a medication with the same name, and this prescription was added. Make sure you understand how and when to take each.   potassium chloride SA 20 MEQ tablet Commonly known as: KLOR-CON M Take 1 tablet (20 mEq total) by mouth 3 (three) times daily. Start taking on: March 25, 2021 What changed: These instructions start on March 25, 2021. If you are unsure what to do until then, ask your doctor or other care provider.   rOPINIRole 1 MG tablet Commonly known as: REQUIP Take 1 mg by mouth at bedtime.   traMADol 50 MG tablet Commonly known as: ULTRAM Take 100 mg by mouth every 8 (eight) hours as needed for moderate pain.   VITAMIN D PO Take 1 capsule by mouth daily.        Day of Discharge BP 132/78 (BP Location: Left Arm)    Pulse 89    Temp 98.2 F (36.8 C) (Oral)    Resp 16    Ht 4\' 11"  (1.499 m)    Wt 36.6 kg    SpO2 100%    BMI 16.30 kg/m   Physical Exam: General: No acute respiratory distress Lungs: Clear to auscultation bilaterally without wheezes or crackles Cardiovascular: Regular rate and rhythm without murmur  Abdomen: Nontender, nondistended, soft, bowel sounds positive Extremities: No significant edema bilateral lower extremities  Basic Metabolic Panel: Recent Labs  Lab 03/19/21 1914 03/20/21 0357  03/20/21 1243 03/21/21 0600 03/21/21 1243  NA 130* 135 135 140 135  K <2.0* 2.0* 2.8* 3.9 3.5  CL 83* 94* 98 101 102  CO2 25 30 28  32 28  GLUCOSE 554* 477* 113* 276* 232*  BUN 11 7 6  5* <5*  CREATININE 0.75 0.55 <0.30* 0.43* 0.43*  CALCIUM 8.8* 8.6* 8.2* 8.7* 8.4*  MG 2.1  --   --  2.4  --     CBC: Recent Labs  Lab 03/19/21 1914 03/20/21 0357  WBC 13.7* 10.7*  HGB 15.9* 14.9  HCT 46.6* 44.1  MCV 88.1 88.6  PLT 255 260    CBG: Recent Labs  Lab 03/20/21 1608 03/20/21 2129 03/20/21 2310 03/21/21 0741 03/21/21 1135  GLUCAP 123* 69* 157* 307* 306*    Recent Results (from the past 240 hour(s))  Resp Panel by RT-PCR (Flu A&B, Covid) Nasopharyngeal Swab     Status: None  Collection Time: 03/19/21  8:23 PM   Specimen: Nasopharyngeal Swab; Nasopharyngeal(NP) swabs in vial transport medium  Result Value Ref Range Status   SARS Coronavirus 2 by RT PCR NEGATIVE NEGATIVE Final    Comment: (NOTE) SARS-CoV-2 target nucleic acids are NOT DETECTED.  The SARS-CoV-2 RNA is generally detectable in upper respiratory specimens during the acute phase of infection. The lowest concentration of SARS-CoV-2 viral copies this assay can detect is 138 copies/mL. A negative result does not preclude SARS-Cov-2 infection and should not be used as the sole basis for treatment or other patient management decisions. A negative result may occur with  improper specimen collection/handling, submission of specimen other than nasopharyngeal swab, presence of viral mutation(s) within the areas targeted by this assay, and inadequate number of viral copies(<138 copies/mL). A negative result must be combined with clinical observations, patient history, and epidemiological information. The expected result is Negative.  Fact Sheet for Patients:  BloggerCourse.com  Fact Sheet for Healthcare Providers:  SeriousBroker.it  This test is no t yet approved  or cleared by the [place] FDA and  has been authorized for detection and/or diagnosis of SARS-CoV-2 by FDA under an Emergency Use Authorization (EUA). This EUA will remain  in effect (meaning this test can be used) for the duration of the COVID-19 declaration under Section 564(b)(1) of the Act, 21 U.S.C.section 360bbb-3(b)(1), unless the authorization is terminated  or revoked sooner.       Influenza A by PCR NEGATIVE NEGATIVE Final   Influenza B by PCR NEGATIVE NEGATIVE Final    Comment: (NOTE) The Xpert Xpress SARS-CoV-2/FLU/RSV plus assay is intended as an aid in the diagnosis of influenza from Nasopharyngeal swab specimens and should not be used as a sole basis for treatment. Nasal washings and aspirates are unacceptable for Xpert Xpress SARS-CoV-2/FLU/RSV testing.  Fact Sheet for Patients: BloggerCourse.com  Fact Sheet for Healthcare Providers: SeriousBroker.it  This test is not yet approved or cleared by the [place] FDA and has been authorized for detection and/or diagnosis of SARS-CoV-2 by FDA under an Emergency Use Authorization (EUA). This EUA will remain in effect (meaning this test can be used) for the duration of the COVID-19 declaration under Section 564(b)(1) of the Act, 21 U.S.C. section 360bbb-3(b)(1), unless the authorization is terminated or revoked.  Performed at [facility], [address] [place], [place] [address]       Time spent in discharge (includes decision making & examination of pt): 35 minutes  03/21/2021, 2:00 PM   Lonia Blood, MD [facility] Office  [phone]

## 2021-08-19 ENCOUNTER — Emergency Department (HOSPITAL_COMMUNITY): Payer: Medicare PPO

## 2021-08-19 ENCOUNTER — Inpatient Hospital Stay (HOSPITAL_COMMUNITY)
Admission: EM | Admit: 2021-08-19 | Discharge: 2021-08-22 | DRG: 193 | Disposition: A | Discharge status: Hospice | Payer: Medicare PPO | Attending: Family Medicine | Admitting: Family Medicine

## 2021-08-19 ENCOUNTER — Other Ambulatory Visit: Payer: Self-pay

## 2021-08-19 ENCOUNTER — Encounter (HOSPITAL_COMMUNITY): Payer: Self-pay

## 2021-08-19 DIAGNOSIS — E160 Drug-induced hypoglycemia without coma: Secondary | ICD-10-CM | POA: Diagnosis not present

## 2021-08-19 DIAGNOSIS — Z681 Body mass index (BMI) 19 or less, adult: Secondary | ICD-10-CM | POA: Diagnosis not present

## 2021-08-19 DIAGNOSIS — F418 Other specified anxiety disorders: Secondary | ICD-10-CM | POA: Diagnosis not present

## 2021-08-19 DIAGNOSIS — K219 Gastro-esophageal reflux disease without esophagitis: Secondary | ICD-10-CM | POA: Diagnosis present

## 2021-08-19 DIAGNOSIS — I69292 Facial weakness following other nontraumatic intracranial hemorrhage: Secondary | ICD-10-CM

## 2021-08-19 DIAGNOSIS — R64 Cachexia: Secondary | ICD-10-CM | POA: Diagnosis present

## 2021-08-19 DIAGNOSIS — R739 Hyperglycemia, unspecified: Secondary | ICD-10-CM | POA: Diagnosis not present

## 2021-08-19 DIAGNOSIS — J449 Chronic obstructive pulmonary disease, unspecified: Secondary | ICD-10-CM | POA: Diagnosis present

## 2021-08-19 DIAGNOSIS — J9601 Acute respiratory failure with hypoxia: Secondary | ICD-10-CM | POA: Diagnosis not present

## 2021-08-19 DIAGNOSIS — E43 Unspecified severe protein-calorie malnutrition: Secondary | ICD-10-CM | POA: Diagnosis present

## 2021-08-19 DIAGNOSIS — R0602 Shortness of breath: Secondary | ICD-10-CM | POA: Diagnosis present

## 2021-08-19 DIAGNOSIS — I6201 Nontraumatic acute subdural hemorrhage: Secondary | ICD-10-CM | POA: Diagnosis present

## 2021-08-19 DIAGNOSIS — E559 Vitamin D deficiency, unspecified: Secondary | ICD-10-CM | POA: Diagnosis present

## 2021-08-19 DIAGNOSIS — G935 Compression of brain: Secondary | ICD-10-CM | POA: Diagnosis present

## 2021-08-19 DIAGNOSIS — Z66 Do not resuscitate: Secondary | ICD-10-CM | POA: Diagnosis present

## 2021-08-19 DIAGNOSIS — Z20822 Contact with and (suspected) exposure to covid-19: Secondary | ICD-10-CM | POA: Diagnosis present

## 2021-08-19 DIAGNOSIS — E1165 Type 2 diabetes mellitus with hyperglycemia: Secondary | ICD-10-CM | POA: Diagnosis present

## 2021-08-19 DIAGNOSIS — E890 Postprocedural hypothyroidism: Secondary | ICD-10-CM | POA: Diagnosis present

## 2021-08-19 DIAGNOSIS — I611 Nontraumatic intracerebral hemorrhage in hemisphere, cortical: Secondary | ICD-10-CM | POA: Diagnosis not present

## 2021-08-19 DIAGNOSIS — R0902 Hypoxemia: Secondary | ICD-10-CM

## 2021-08-19 DIAGNOSIS — Z91148 Patient's other noncompliance with medication regimen for other reason: Secondary | ICD-10-CM

## 2021-08-19 DIAGNOSIS — T383X5A Adverse effect of insulin and oral hypoglycemic [antidiabetic] drugs, initial encounter: Secondary | ICD-10-CM | POA: Diagnosis not present

## 2021-08-19 DIAGNOSIS — E876 Hypokalemia: Secondary | ICD-10-CM | POA: Diagnosis present

## 2021-08-19 DIAGNOSIS — E039 Hypothyroidism, unspecified: Secondary | ICD-10-CM | POA: Diagnosis present

## 2021-08-19 DIAGNOSIS — R627 Adult failure to thrive: Secondary | ICD-10-CM | POA: Diagnosis present

## 2021-08-19 DIAGNOSIS — M329 Systemic lupus erythematosus, unspecified: Secondary | ICD-10-CM | POA: Diagnosis present

## 2021-08-19 DIAGNOSIS — Z515 Encounter for palliative care: Secondary | ICD-10-CM | POA: Diagnosis not present

## 2021-08-19 DIAGNOSIS — J189 Pneumonia, unspecified organism: Principal | ICD-10-CM

## 2021-08-19 DIAGNOSIS — J42 Unspecified chronic bronchitis: Secondary | ICD-10-CM

## 2021-08-19 DIAGNOSIS — E1069 Type 1 diabetes mellitus with other specified complication: Secondary | ICD-10-CM | POA: Diagnosis present

## 2021-08-19 LAB — CBC WITH DIFFERENTIAL/PLATELET
Abs Immature Granulocytes: 0.03 10*3/uL (ref 0.00–0.07)
Basophils Absolute: 0 10*3/uL (ref 0.0–0.1)
Basophils Relative: 1 %
Eosinophils Absolute: 0.1 10*3/uL (ref 0.0–0.5)
Eosinophils Relative: 1 %
HCT: 41.5 % (ref 36.0–46.0)
Hemoglobin: 14.1 g/dL (ref 12.0–15.0)
Immature Granulocytes: 0 %
Lymphocytes Relative: 26 %
Lymphs Abs: 1.8 10*3/uL (ref 0.7–4.0)
MCH: 30.3 pg (ref 26.0–34.0)
MCHC: 34 g/dL (ref 30.0–36.0)
MCV: 89.2 fL (ref 80.0–100.0)
Monocytes Absolute: 0.8 10*3/uL (ref 0.1–1.0)
Monocytes Relative: 11 %
Neutro Abs: 4.3 10*3/uL (ref 1.7–7.7)
Neutrophils Relative %: 61 %
Platelets: 269 10*3/uL (ref 150–400)
RBC: 4.65 MIL/uL (ref 3.87–5.11)
RDW: 15.1 % (ref 11.5–15.5)
WBC: 7 10*3/uL (ref 4.0–10.5)
nRBC: 0 % (ref 0.0–0.2)

## 2021-08-19 LAB — URINALYSIS, ROUTINE W REFLEX MICROSCOPIC
Bilirubin Urine: NEGATIVE
Glucose, UA: >=500 mg/dL — AB
Hgb urine dipstick: NEGATIVE
Ketones, ur: 5 mg/dL — AB
Nitrite: NEGATIVE
Protein, ur: NEGATIVE mg/dL
Specific Gravity, Urine: 1.017 (ref 1.005–1.030)
pH: 9 — ABNORMAL HIGH (ref 5.0–8.0)

## 2021-08-19 LAB — LACTIC ACID, PLASMA: Lactic Acid, Venous: 1.2 mmol/L (ref 0.5–1.9)

## 2021-08-19 LAB — COMPREHENSIVE METABOLIC PANEL
ALT: 7 U/L (ref 0–44)
AST: 21 U/L (ref 15–41)
Albumin: 3.1 g/dL — ABNORMAL LOW (ref 3.5–5.0)
Alkaline Phosphatase: 104 U/L (ref 38–126)
Anion gap: 8 (ref 5–15)
BUN: <5 mg/dL — ABNORMAL LOW (ref 6–20)
CO2: 41 mmol/L — ABNORMAL HIGH (ref 22–32)
Calcium: 8.6 mg/dL — ABNORMAL LOW (ref 8.9–10.3)
Chloride: 89 mmol/L — ABNORMAL LOW (ref 98–111)
Creatinine, Ser: 0.41 mg/dL — ABNORMAL LOW (ref 0.44–1.00)
GFR, Estimated: >60 mL/min (ref >60)
Glucose, Bld: 391 mg/dL — ABNORMAL HIGH (ref 70–99)
Potassium: 2.4 mmol/L — CL (ref 3.5–5.1)
Sodium: 138 mmol/L (ref 135–145)
Total Bilirubin: 0.6 mg/dL (ref 0.3–1.2)
Total Protein: 6.1 g/dL — ABNORMAL LOW (ref 6.5–8.1)

## 2021-08-19 LAB — BLOOD GAS, ARTERIAL
Acid-Base Excess: 22.1 mmol/L — ABNORMAL HIGH (ref 0.0–2.0)
Bicarbonate: 48.1 mmol/L — ABNORMAL HIGH (ref 20.0–28.0)
Drawn by: 4627
FIO2: 28 %
O2 Saturation: 96.7 %
Patient temperature: 37
pCO2 arterial: 55 mmHg — ABNORMAL HIGH (ref 32–48)
pH, Arterial: 7.55 — ABNORMAL HIGH (ref 7.35–7.45)
pO2, Arterial: 77 mmHg — ABNORMAL LOW (ref 83–108)

## 2021-08-19 LAB — HEMOGLOBIN A1C
Hgb A1c MFr Bld: 14.4 % — ABNORMAL HIGH (ref 4.8–5.6)
Mean Plasma Glucose: 366.58 mg/dL

## 2021-08-19 LAB — RESP PANEL BY RT-PCR (FLU A&B, COVID) ARPGX2
Influenza A by PCR: NEGATIVE
Influenza B by PCR: NEGATIVE
SARS Coronavirus 2 by RT PCR: NEGATIVE

## 2021-08-19 LAB — GLUCOSE, CAPILLARY
Glucose-Capillary: 327 mg/dL — ABNORMAL HIGH (ref 70–99)
Glucose-Capillary: 221 mg/dL — ABNORMAL HIGH (ref 70–99)

## 2021-08-19 LAB — TROPONIN I (HIGH SENSITIVITY): Troponin I (High Sensitivity): 15 ng/L (ref <18)

## 2021-08-19 LAB — PROTIME-INR
INR: 0.9 (ref 0.8–1.2)
Prothrombin Time: 12.2 seconds (ref 11.4–15.2)

## 2021-08-19 LAB — MAGNESIUM: Magnesium: 2.2 mg/dL (ref 1.7–2.4)

## 2021-08-19 LAB — APTT: aPTT: 24 seconds (ref 24–36)

## 2021-08-19 LAB — BRAIN NATRIURETIC PEPTIDE: B Natriuretic Peptide: 200 pg/mL — ABNORMAL HIGH (ref 0.0–100.0)

## 2021-08-19 MED ORDER — LEVOFLOXACIN IN D5W 750 MG/150ML IV SOLN
750.0000 mg | Freq: Once | INTRAVENOUS | Status: AC
  Administered 2021-08-19: 750 mg via INTRAVENOUS
  Filled 2021-08-19: qty 150

## 2021-08-19 MED ORDER — LACTATED RINGERS IV SOLN: INTRAVENOUS | Status: DC

## 2021-08-19 MED ORDER — VITAMIN D 25 MCG (1000 UNIT) PO TABS
1000.0000 [IU] | ORAL_TABLET | ORAL | Status: DC
  Administered 2021-08-20: 1000 [IU] via ORAL
  Filled 2021-08-19: qty 1

## 2021-08-19 MED ORDER — INSULIN ASPART 100 UNIT/ML IJ SOLN
0.0000 [IU] | Freq: Three times a day (TID) | INTRAMUSCULAR | Status: DC
  Administered 2021-08-19: 7 [IU] via SUBCUTANEOUS
  Administered 2021-08-20: 2 [IU] via SUBCUTANEOUS
  Administered 2021-08-20: 1 [IU] via SUBCUTANEOUS
  Administered 2021-08-21: 3 [IU] via SUBCUTANEOUS

## 2021-08-19 MED ORDER — LACTATED RINGERS IV SOLN: INTRAVENOUS | Status: AC

## 2021-08-19 MED ORDER — INSULIN ASPART 100 UNIT/ML IJ SOLN
6.0000 [IU] | Freq: Three times a day (TID) | INTRAMUSCULAR | Status: DC
  Administered 2021-08-19 – 2021-08-20 (×3): 6 [IU] via SUBCUTANEOUS

## 2021-08-19 MED ORDER — ENOXAPARIN SODIUM 30 MG/0.3ML IJ SOSY
30.0000 mg | PREFILLED_SYRINGE | INTRAMUSCULAR | Status: DC
  Administered 2021-08-19 – 2021-08-20 (×2): 30 mg via SUBCUTANEOUS
  Filled 2021-08-19 (×2): qty 0.3

## 2021-08-19 MED ORDER — LORAZEPAM 0.5 MG PO TABS
0.5000 mg | ORAL_TABLET | Freq: Three times a day (TID) | ORAL | Status: DC | PRN
  Administered 2021-08-20 (×2): 0.5 mg via ORAL
  Filled 2021-08-19 (×2): qty 1

## 2021-08-19 MED ORDER — ROPINIROLE HCL 1 MG PO TABS
1.0000 mg | ORAL_TABLET | Freq: Every day | ORAL | Status: DC
Start: 2021-08-19 — End: 2021-08-21
  Administered 2021-08-20: 1 mg via ORAL
  Filled 2021-08-19 (×2): qty 1

## 2021-08-19 MED ORDER — POTASSIUM CHLORIDE 10 MEQ/100ML IV SOLN
10.0000 meq | Freq: Once | INTRAVENOUS | Status: AC
  Administered 2021-08-19: 10 meq via INTRAVENOUS
  Filled 2021-08-19: qty 100

## 2021-08-19 MED ORDER — PANTOPRAZOLE SODIUM 40 MG PO TBEC
40.0000 mg | DELAYED_RELEASE_TABLET | Freq: Every evening | ORAL | Status: DC
  Administered 2021-08-19: 40 mg via ORAL
  Filled 2021-08-19 (×2): qty 1

## 2021-08-19 MED ORDER — LEVOFLOXACIN IN D5W 500 MG/100ML IV SOLN
500.0000 mg | INTRAVENOUS | Status: DC
  Administered 2021-08-20: 500 mg via INTRAVENOUS
  Filled 2021-08-19: qty 100

## 2021-08-19 MED ORDER — BISACODYL 5 MG PO TBEC: 5.0000 mg | DELAYED_RELEASE_TABLET | Freq: Every day | ORAL | Status: DC | PRN

## 2021-08-19 MED ORDER — TRAMADOL HCL 50 MG PO TABS
100.0000 mg | ORAL_TABLET | Freq: Three times a day (TID) | ORAL | Status: DC | PRN
Start: 2021-08-19 — End: 2021-08-19

## 2021-08-19 MED ORDER — FENTANYL CITRATE PF 50 MCG/ML IJ SOSY
12.5000 ug | PREFILLED_SYRINGE | INTRAMUSCULAR | Status: DC | PRN
  Administered 2021-08-20 – 2021-08-21 (×7): 12.5 ug via INTRAVENOUS
  Filled 2021-08-19 (×8): qty 1

## 2021-08-19 MED ORDER — SODIUM CHLORIDE 0.9 % IV SOLN: 1.0000 g | INTRAVENOUS | Status: DC

## 2021-08-19 MED ORDER — GUAIFENESIN ER 600 MG PO TB12
600.0000 mg | ORAL_TABLET | Freq: Two times a day (BID) | ORAL | Status: DC
  Administered 2021-08-19 – 2021-08-21 (×4): 600 mg via ORAL
  Filled 2021-08-19 (×5): qty 1

## 2021-08-19 MED ORDER — IPRATROPIUM-ALBUTEROL 0.5-2.5 (3) MG/3ML IN SOLN
3.0000 mL | Freq: Four times a day (QID) | RESPIRATORY_TRACT | Status: DC
  Administered 2021-08-19 – 2021-08-21 (×6): 3 mL via RESPIRATORY_TRACT
  Filled 2021-08-19 (×3): qty 3
  Filled 2021-08-19: qty 6
  Filled 2021-08-19: qty 3

## 2021-08-19 MED ORDER — HYDROCODONE-ACETAMINOPHEN 5-325 MG PO TABS
1.0000 | ORAL_TABLET | Freq: Four times a day (QID) | ORAL | Status: DC | PRN
  Administered 2021-08-19 – 2021-08-21 (×5): 1 via ORAL
  Filled 2021-08-19 (×5): qty 1

## 2021-08-19 MED ORDER — POTASSIUM CHLORIDE CRYS ER 20 MEQ PO TBCR
60.0000 meq | EXTENDED_RELEASE_TABLET | Freq: Once | ORAL | Status: AC
  Administered 2021-08-19: 60 meq via ORAL
  Filled 2021-08-19: qty 3

## 2021-08-19 MED ORDER — ORAL CARE MOUTH RINSE
15.0000 mL | Freq: Two times a day (BID) | OROMUCOSAL | Status: DC
  Administered 2021-08-20 – 2021-08-22 (×4): 15 mL via OROMUCOSAL

## 2021-08-19 MED ORDER — ACETAMINOPHEN 650 MG RE SUPP
650.0000 mg | Freq: Four times a day (QID) | RECTAL | Status: DC | PRN
Start: 2021-08-19 — End: 2021-08-21

## 2021-08-19 MED ORDER — INSULIN GLARGINE-YFGN 100 UNIT/ML ~~LOC~~ SOLN
16.0000 [IU] | Freq: Every day | SUBCUTANEOUS | Status: DC
  Administered 2021-08-19 – 2021-08-20 (×2): 16 [IU] via SUBCUTANEOUS
  Filled 2021-08-19 (×3): qty 0.16

## 2021-08-19 MED ORDER — LEVOTHYROXINE SODIUM 75 MCG PO TABS
150.0000 ug | ORAL_TABLET | Freq: Every day | ORAL | Status: DC
Start: 2021-08-20 — End: 2021-08-21
  Administered 2021-08-20 – 2021-08-21 (×2): 150 ug via ORAL
  Filled 2021-08-19 (×2): qty 2

## 2021-08-19 MED ORDER — ONDANSETRON HCL 4 MG/2ML IJ SOLN
4.0000 mg | Freq: Four times a day (QID) | INTRAMUSCULAR | Status: DC | PRN
Start: 2021-08-19 — End: 2021-08-21

## 2021-08-19 MED ORDER — INSULIN ASPART 100 UNIT/ML IJ SOLN
0.0000 [IU] | Freq: Every day | INTRAMUSCULAR | Status: DC
  Administered 2021-08-19: 2 [IU] via SUBCUTANEOUS

## 2021-08-19 MED ORDER — IPRATROPIUM-ALBUTEROL 0.5-2.5 (3) MG/3ML IN SOLN
3.0000 mL | Freq: Once | RESPIRATORY_TRACT | Status: AC
  Administered 2021-08-19: 3 mL via RESPIRATORY_TRACT
  Filled 2021-08-19: qty 3

## 2021-08-19 MED ORDER — ACETAMINOPHEN 325 MG PO TABS: 650.0000 mg | ORAL_TABLET | Freq: Four times a day (QID) | ORAL | Status: DC | PRN

## 2021-08-19 MED ORDER — GABAPENTIN 400 MG PO CAPS
400.0000 mg | ORAL_CAPSULE | Freq: Three times a day (TID) | ORAL | Status: DC
Start: 2021-08-19 — End: 2021-08-22
  Administered 2021-08-19 – 2021-08-21 (×5): 400 mg via ORAL
  Filled 2021-08-19 (×7): qty 1

## 2021-08-19 MED ORDER — NICOTINE 21 MG/24HR TD PT24: 21.0000 mg | MEDICATED_PATCH | Freq: Every day | TRANSDERMAL | Status: DC | PRN

## 2021-08-19 MED ORDER — SODIUM CHLORIDE 0.9 % IV SOLN: 500.0000 mg | INTRAVENOUS | Status: DC

## 2021-08-19 MED ORDER — TRAZODONE HCL 50 MG PO TABS: 25.0000 mg | ORAL_TABLET | Freq: Every evening | ORAL | Status: DC | PRN

## 2021-08-19 MED ORDER — ONDANSETRON HCL 4 MG PO TABS
4.0000 mg | ORAL_TABLET | Freq: Four times a day (QID) | ORAL | Status: DC | PRN
Start: 2021-08-19 — End: 2021-08-21

## 2021-08-19 NOTE — Hospital Course (Addendum)
61 y/o female with multiple serious medical comorbidities including type 1 diabetes mellitus, chronic depression, GERD, systemic lupus, frequent UTIs, fibromyalgia, chronic tobacco abuse, hypokalemia, intermittent bouts of chronic diarrhea, long history of poor compliance with taking medicines, following up with physicians and following recommendations.  She continues to smoke cigarettes despite being counseled at length about the dangers of smoking and diabetes mellitus.  Patient has reported unstable lupus because she is refusing to follow-up with rheumatology.  She also has had multiple referrals and missed appointments with endocrinology but continues to no-show for appointments.  She rarely takes her basal insulin and her A1c remains greater than 10 up to greater than 12.  She has complications associated with long-term poorly controlled diabetes mellitus including diabetic neuropathy, nephropathy, retinopathy.  Patient is severely emaciated likely due to not taking enough insulin and has vitamin D deficiency, forgetfulness, multiple bouts of pneumonia, and recurrent hospitalizations.  Patient presented to emergency department by EMS complaining of shortness of breath weakness and diarrhea x1 week.  She also complained of right wrist pain and swelling 7 out of 10.  She was noted to have a pulse ox of 88% on room air.  She is not on home oxygen.  She reports that most of the night she felt unwell and was short of breath more than her baseline.  She reports nonproductive cough no fever or chills.  She also reports ankle edema.  She lives by herself.  She was last hospitalized in December 2022.  At that time she had severe hypokalemia and severe weakness and was unable to get out of bed at that time.  Pt also has a history of leaving hospital against medical advice.    Today she is noted to be hypoxemic.  She had a PE O2 of 77 with a PCO2 of 55.  She was hypokalemic with a potassium of 2.4 and a glucose of  391.  She had a mildly elevated BNP at 200.0.  Her high-sensitivity troponin was 15.  Her lactic acid was 1.2.  She had a normal CBC.  Her SARS 2 coronavirus test and influenza testing was negative.  There was a retrocardiac opacity noted on chest x-ray consistent with pneumonia and patient was noted to have had a CT chest in 2021 that demonstrated a pulmonary nodule in that area and the radiologist is recommended that patient have a repeat CT chest done after completion of antibiotics.  The patient is being admitted into the hospital for further management.  08/20/2021 : pt has been obstinate at times and threatening to leave AMA, however, after talking with her further she is agreeable to consider ALF placement.  Also requested TTS consult for capacity evaluation.

## 2021-08-19 NOTE — Progress Notes (Signed)
A consult was received from an ED physician for Levaquin per pharmacy dosing for CAP.  The patient's profile has been reviewed for ht/wt/allergies/indication/available labs.   A/P: Pharmacy asked to investigate PCN allergy and  change to ceftriaxone and azithromycin if able for tx of CAP. After discussing with patient, she has tolerated cephalexin in the past and the allergy to PCN was when she was 61 years old.  Levaquin x 1 dose IV given in ED, will change to ceftriaxone 1gm IV q24 and Azithromycin 500mg  IV q24h to complete 4 days of tx for CAP.                  Thank you, 08/19/2021  11:09 AM

## 2021-08-19 NOTE — ED Notes (Signed)
Attempted to call report.  RN at lunch.  Will return call after break.

## 2021-08-19 NOTE — ED Triage Notes (Signed)
Per EMS, Pt, from home, c/o SOB and increasing weakness x1 day and diarrhea x1 week.  Pt c/o R wrist pain and swelling x1 day.  Pain score 7/10.  Pt was 88% on room air.

## 2021-08-19 NOTE — Assessment & Plan Note (Signed)
--   long history of poorly controlled disease as evidenced by an A1c>10%.   -- PCP reports that patient usually misses many doses of basal insulin and had multiple excuses why she doesn't take regularly and benefits check was done last admission and basal lantus insulin would be cost of $35 or less for patient.  -- we have restarted on basal bolus insulin weight based and will monitor CBG closely and make adjustments as needed to try to improve glycemic control.

## 2021-08-19 NOTE — Progress Notes (Signed)
Pt arrived to room #331 via stretcher from ED. Pt ambulatory from stretcher to bed with only standby assist. No c/o SOB with exertion. Pt oriented to room and safety procedures. IVF infusing without difficulty or s/s infiltration. O2 on @ 2lpm . Pt with adult pull-up brief on, saturated with urine. Pt cleaned and dry mesh panties and peri-pad applied.Pt c/o hungry, states hasn't eaten in 2 days. Peanut butter, crackers and soda given to pt. Pt oriented to safety procedures, states understanding. Call bell within reach.

## 2021-08-19 NOTE — Assessment & Plan Note (Signed)
--   notes long history of tobacco use with physical exam evidence of tar-stained fingers and clubbing of fingers -- bronchodilators ordered and mucinex ordered for symptoms

## 2021-08-19 NOTE — Assessment & Plan Note (Signed)
--   Pt not gaining weight due to absolute insulin deficiency -- we are trying to get her back on a regular insulin treatment program -- consult to dietitian for assistance -- repleting electrolytes and will follow Mg, Phos closely -- Pt is HIGH RISK for refeeding syndrome and we will monitor electrolytes daily and replace them aggressively as needed.

## 2021-08-19 NOTE — Assessment & Plan Note (Signed)
--  protonix ordered for GI protection  ?

## 2021-08-19 NOTE — H&P (Signed)
History and Physical  [facility]  Autumn Daniels:[id number] DOB: [birth year] DOA: 08/19/2021  PCP: April MansonWhite, Marsha L, NP  Patient coming from: Home  Level of care: Telemetry  I have personally briefly reviewed patient[facility]  Chief Complaint: SOB  HPI: Autumn CurlingMary Bardon is a 61 y/o female with multiple serious medical comorbidities including type 1 diabetes mellitus, chronic depression, GERD, systemic lupus, frequent UTIs, fibromyalgia, chronic tobacco abuse, hypokalemia, intermittent bouts of chronic diarrhea, long history of poor compliance with taking medicines, following up with physicians and following recommendations.  She continues to smoke cigarettes despite being counseled at length about the dangers of smoking and diabetes mellitus.  Patient has reported unstable lupus because she is refusing to follow-up with rheumatology.  She also has had multiple referrals and missed appointments with endocrinology but continues to no-show for appointments.  She rarely takes her basal insulin and her A1c remains greater than 10 up to greater than 12.  She has complications associated with long-term poorly controlled diabetes mellitus including diabetic neuropathy, nephropathy, retinopathy.  Patient is severely emaciated likely due to not taking enough insulin and has vitamin D deficiency, forgetfulness, multiple bouts of pneumonia, and recurrent hospitalizations.  Patient presented to emergency department by EMS complaining of shortness of breath weakness and diarrhea x1 week.  She also complained of right wrist pain and swelling 7 out of 10.  She was noted to have a pulse ox of 88% on room air.  She is not on home oxygen.  She reports that most of the night she felt unwell and was short of breath more than her baseline.  She reports nonproductive cough no fever or chills.  She also reports ankle edema.  She lives by herself.  She was last hospitalized in December 2022.   At that time she had severe hypokalemia and severe weakness and was unable to get out of bed at that time.  Pt also has a history of leaving hospital against medical advice.    Today she is noted to be hypoxemic.  She had a PE O2 of 77 with a PCO2 of 55.  She was hypokalemic with a potassium of 2.4 and a glucose of 391.  She had a mildly elevated BNP at 200.0.  Her high-sensitivity troponin was 15.  Her lactic acid was 1.2.  She had a normal CBC.  Her SARS 2 coronavirus test and influenza testing was negative.  There was a retrocardiac opacity noted on chest x-ray consistent with pneumonia and patient was noted to have had a CT chest in 2021 that demonstrated a pulmonary nodule in that area and the radiologist is recommended that patient have a repeat CT chest done after completion of antibiotics.  The patient is being admitted into the hospital for further management.   Review of Systems: Review of Systems  Constitutional:  Positive for malaise/fatigue and weight loss. Negative for chills, diaphoresis and fever.  HENT: Negative.    Eyes: Negative.   Respiratory:  Positive for cough, sputum production, shortness of breath and wheezing.   Cardiovascular:  Negative for chest pain, palpitations, orthopnea and claudication.  Gastrointestinal:  Positive for heartburn. Negative for abdominal pain, constipation and melena.  Genitourinary: Negative.   Musculoskeletal:  Positive for myalgias. Negative for falls and joint pain.  Skin: Negative.   Neurological:  Positive for dizziness, tingling and headaches.  Endo/Heme/Allergies: Negative.   Psychiatric/Behavioral:  Positive for depression. Negative for suicidal ideas. The patient is  nervous/anxious.   All other systems reviewed and are negative.   Past Medical History:  Diagnosis Date   Anemia    Anxiety    Depression    Diabetes mellitus without complication (HCC)    type 1   Diarrhea    as of 12/2014 - pt states that she's had diarrhea for a  year   Fibromyalgia    Forgetfulness 10/2016   Frequent UTI    due to small urethra   GERD (gastroesophageal reflux disease)    Headache    hx of migraines   History of hiatal hernia    Lupus (HCC)    Memory difficulty 10/2016   Noncompliance    Pneumonia    Stevens-Almee Pelphrey syndrome (HCC)    Thyroid disease    has had radioactive iodine treatment for hyperthyroidism in 1990   Vitamin D deficiency 07/07/2014    Past Surgical History:  Procedure Laterality Date   CHOLECYSTECTOMY     2 bifurcations of liver during lap chole   EXTERNAL FIXATION LEG Right 06/30/2014   Procedure: EXTERNAL FIXATION LEG;  Surgeon: Vickki Hearing, MD;  Location: [facility];  Service: Orthopedics;  Laterality: Right;  synthes large fragment exteranal fixation  c arm    EXTERNAL FIXATION LEG Right 07/07/2014   Procedure: REVISION EXTERNAL FIXATION LEG;  Surgeon: Myrene Galas, MD;  Location[facility] OR;  Service: Orthopedics;  Laterality: Right;   EXTERNAL FIXATION REMOVAL Right 07/18/2014   Procedure: REMOVAL EXTERNAL FIXATION LEG;  Surgeon: Myrene Galas, MD;  Location: [facility] OR;  Service: Orthopedics;  Laterality: Right;   HARDWARE REMOVAL Right 01/09/2015   Procedure: HARDWARE REMOVAL,RIGHT PROXIMAL TIBIAL;  Surgeon: Myrene Galas, MD;  Location: [facility] OR;  Service: Orthopedics;  Laterality: Right;   KNEE ARTHROSCOPY Right 01/09/2015   Procedure: ARTHROSCOPY KNEE RIGHT ;  Surgeon: Myrene Galas, MD;  Location: [facility] OR;  Service: Orthopedics;  Laterality: Right;   LIVER SURGERY     x4 due to bifurcations from lap chole   ORIF TIBIA PLATEAU Right 07/18/2014   Procedure: RIGHT OPEN REDUCTION INTERNAL FIXATION (ORIF) TIBIA FRACTURE;  Surgeon: Myrene Galas, MD;  Location: [facility] OR;  Service: Orthopedics;  Laterality: Right;  ORIF of bicondylar plateau fracture   STERIOD INJECTION Bilateral 01/09/2015   Procedure: RIGHT RING FINGER AND LEFT RING FINGER STEROID INJECTION;  Surgeon: Myrene Galas, MD;  Location: [facility] OR;  Service:  Orthopedics;  Laterality: Bilateral;   TONSILLECTOMY       reports that she has been smoking cigarettes. She has a 15.00 pack-year smoking history. She has never used smokeless tobacco. She reports that she does not drink alcohol and does not use drugs.  Allergies  Allergen Reactions   Penicillins Other (See Comments)    Did it involve swelling of the face/tongue/throat, SOB, or low BP? Yes Did it involve sudden or severe rash/hives, skin peeling, or any reaction on the inside of your mouth or nose? Yes Did you need to seek medical attention at a hospital or doctor's office? Yes When did it last happen?      61 years old If all above answers are "NO", may proceed with cephalosporin use. Tolerated cephalexin in past.    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 Authorizing Provider  albuterol (VENTOLIN HFA) 108 (90 Base) MCG/ACT inhaler Inhale 2 puffs into the lungs every 4 (four) hours as needed for wheezing or shortness of breath (or coughing). 01/02/21  Yes Dione Booze, MD  Cholecalciferol (VITAMIN D3) 1.25 MG (50000 UT) CAPS Take 1 capsule by mouth once a week. 06/21/21  Yes [provider]  gabapentin (NEURONTIN) 400 MG capsule Take 1 capsule (400 mg total) by mouth 3 (three) times daily. 05/04/18  Yes Sabas Sous, MD  insulin glargine (LANTUS) 100 UNIT/ML Solostar Pen Inject 10 Units into the skin daily. 03/21/21  Yes Lonia Blood, MD  levothyroxine (SYNTHROID) 150 MCG tablet Take 150 mcg by mouth daily. 08/05/21  Yes [provider]  LORazepam (ATIVAN) 0.5 MG tablet Take 0.5 mg by mouth every 8 (eight)  hours as needed. for anxiety 06/23/17  Yes [provider]  NOVOLOG FLEXPEN 100 UNIT/ML FlexPen Inject 4-16 Units into the skin 3 (three) times daily. SIG:70-130 0 units 131-180 4 units 181-240 8 units 241-300 10 units 301-350 12 units 351-400 16 Units >400 20 units and call MD 07/19/21  Yes [provider]  rOPINIRole (REQUIP) 1 MG tablet Take 1 mg by mouth at bedtime.  04/18/16  Yes [provider]  traMADol (ULTRAM) 50 MG tablet Take 100 mg by mouth every 8 (eight) hours as needed for moderate pain.    Yes [provider]  Insulin Pen Needle 32G X 4 MM MISC 10 Units by Does not apply route daily. 03/21/21   Lonia Blood, MD  potassium chloride SA (KLOR-CON M) 20 MEQ tablet Take 2 tablets (40 mEq total) by mouth 3 (three) times daily for 4 days. 03/21/21 03/25/21  Lonia Blood, MD  Potassium 99 MG TABS Take 1 tablet by mouth daily as needed (for leg cramping).  06/28/20  [provider]    Physical Exam: Vitals:   08/19/21 1300 08/19/21 1311 08/19/21 1330 08/19/21 1350  BP: (!) 162/80  (!) 149/127 (!) 151/75  Pulse:  64 70 74  Resp: Temp:      TempSrc:      SpO2:  95% 92% 91%  Weight:      Height:       Constitutional: severely emaciated female, curled up on gurney, speaking softly and short sentences, calm, Uncomfortable Eyes: PERRL, lids and conjunctivae normal ENMT: Mucous membranes are dry. Posterior pharynx clear of any exudate or lesions.Normal dentition.  Neck: normal, supple, no masses, no thyromegaly Respiratory: clear to auscultation bilaterally, no wheezing, no crackles. Normal respiratory effort. No accessory muscle use.  Cardiovascular: normal s1, s2 sounds, no murmurs / rubs / gallops. No extremity edema. 2+ pedal pulses. No carotid bruits.  Abdomen: no tenderness, no masses palpated. No hepatosplenomegaly. Bowel sounds positive.  Musculoskeletal: no clubbing / cyanosis. No joint deformity upper and lower  extremities. Good ROM, no contractures. Normal muscle tone.  Skin: tar-stained fingers and fingernails with clubbing of fingernails seen, no rashes, lesions, ulcers. No induration Neurologic: CN 2-12 grossly intact. Sensation intact, DTR normal. Strength 5/5 in all 4.  Psychiatric: poor judgment and insight. Alert and oriented x 3. Agitated mood.   Labs on Admission: I have personally reviewed following labs and imaging studies  CBC: Recent Labs  Lab 08/19/21 1035  WBC 7.0  NEUTROABS 4.3  HGB 14.1  HCT 41.5  MCV 89.2  PLT 269   Basic Metabolic Panel: Recent Labs  Lab 08/19/21 1035  NA 138  K 2.4*  CL 89*  CO2 41*  GLUCOSE 391*  BUN <5*  CREATININE 0.41*  CALCIUM 8.6*  MG 2.2   GFR: Estimated Creatinine Clearance: 48.2 mL/min (A) (by C-G formula based on SCr of 0.41 mg/dL (L)). Liver Function Tests: Recent Labs  Lab 08/19/21 1035  AST 21  ALT 7  ALKPHOS 104  BILITOT 0.6  PROT 6.1*  ALBUMIN 3.1*   No results for input(s): LIPASE, AMYLASE in the last 168 hours. No results for input(s): AMMONIA in the last 168 hours. Coagulation Profile: Recent Labs  Lab 08/19/21 1049  INR 0.9   Cardiac Enzymes: No results for input(s): CKTOTAL, CKMB, CKMBINDEX, TROPONINI in the last 168 hours. BNP (last 3 results) No results for input(s): PROBNP in the last 8760 hours. HbA1C: No results for input(s): HGBA1C in the last 72 hours. CBG: No results for input(s): GLUCAP in the last 168 hours. Lipid Profile: No results for input(s): CHOL, HDL, LDLCALC, TRIG, CHOLHDL, LDLDIRECT in the last 72 hours. Thyroid Function Tests: No results for input(s): TSH, T4TOTAL, FREET4, T3FREE, THYROIDAB in the last 72 hours. Anemia Panel: No results for input(s): VITAMINB12, FOLATE, FERRITIN, TIBC, IRON, RETICCTPCT in the last 72 hours. Urine analysis:    Component Value Date/Time   COLORURINE STRAW (A) 08/19/2021 1014   APPEARANCEUR CLEAR 08/19/2021 1014   LABSPEC 1.017 08/19/2021 1014    PHURINE 9.0 (H) 08/19/2021 1014   GLUCOSEU >=500 (A) 08/19/2021 1014   HGBUR NEGATIVE 08/19/2021 1014   BILIRUBINUR NEGATIVE 08/19/2021 1014   KETONESUR 5 (A) 08/19/2021 1014   PROTEINUR NEGATIVE 08/19/2021 1014   NITRITE NEGATIVE 08/19/2021 1014   LEUKOCYTESUR TRACE (A) 08/19/2021 1014    Radiological Exams on Admission: DG Chest Port 1 View  Result Date: 08/19/2021 CLINICAL DATA:  Cough and shortness of breath EXAM: PORTABLE CHEST 1 VIEW COMPARISON:  02/11/2021 FINDINGS: Retrocardiac airspace opacity. Artifact from EKG leads. Background interstitial coarsening which is generalized. No visible effusion or pneumothorax. Normal heart size and mediastinal contours. IMPRESSION: Retrocardiac opacity, history suggesting pneumonia. There was a pulmonary nodule in this area on 2021 chest CT, recommend follow-up chest CT after completed antibiotics. Electronically Signed   By: Tiburcio Pea M.D.   On: 08/19/2021 10:50    EKG: Independently reviewed. No QT prolongation seen  Assessment/Plan Principal Problem:   CAP (community acquired pneumonia) Active Problems:   Acute respiratory failure with hypoxemia (HCC)   Failure to thrive in adult   Noncompliance with medication regimen   COPD (chronic obstructive pulmonary disease) (HCC)   Vitamin D deficiency   Hypothyroidism following radioiodine therapy   Type 1 diabetes mellitus with other specified complication (HCC)   GERD (gastroesophageal reflux disease)   Lupus (HCC)   Hypokalemia   Protein-calorie malnutrition, severe   Hyperglycemia   Assessment and Plan: COPD (chronic obstructive pulmonary disease) (HCC) -- notes long history of tobacco use with physical exam evidence of tar-stained fingers and clubbing of fingers -- bronchodilators ordered and mucinex ordered for symptoms  Noncompliance with medication regimen -- unfortunately this patient is at HIGH RISK for acute and chronic complications of poorly controlled diabetes  mellitus in addition to her other poorly controlled comorbidities -- she has been counseled by admitting physician and documented that her PCP has also counseled with her and tried to work  and assist her on multiple occasions.   Failure to thrive in adult -- pt presents in a severely emaciated state -- pt is at high risk for adverse outcome in this current condition -- consult to [facility] as adult protection services may need to get involved due to extreme self harm and neglect  Acute respiratory failure with hypoxemia (HCC) -- Patient presenting with a new oxygen requirement and hypoxemia on admission PCO2 of 55. -- She also has findings of pneumonia on chest x-ray being treated with IV antibiotic therapy. -- Patient also has underlying COPD given long history of tobacco abuse.   Hyperglycemia -- secondary to poor compliance with taking insulin.  This has been well documented by her PCP the multiple attempts made to assist patient with getting medications, multiple referrals to endocrinology and no show appointments -- See insulin orders: given her extreme low weight we have ordered weight based insulin dosing and will titrate based on response.   Protein-calorie malnutrition, severe -- Pt not gaining weight due to absolute insulin deficiency -- we are trying to get her back on a regular insulin treatment program -- consult to dietitian for assistance -- repleting electrolytes and will follow Mg, Phos closely -- Pt is HIGH RISK for refeeding syndrome and we will monitor electrolytes daily and replace them aggressively as needed.    Hypokalemia -- she is clearly severely total body depleted of potassium for many reasons including diarrhea, hyperglycemia with probable ketoacidosis, etc.  -- IV and oral potassium ordered -- follow up magnesium and replace if needed -- anticipating need for much more potassium replacement needs when hyperglycemia is starting to correct and potassium enters the  cells  -- for now, monitoring BMP and Mg closely   Lupus (HCC) -- PCP reported that SLE has been unstable as patient refuses to follow up with rheumatologist   GERD (gastroesophageal reflux disease) -- protonix ordered for GI protection   Type 1 diabetes mellitus with other specified complication (HCC) -- long history of poorly controlled disease as evidenced by an A1c>10%.   -- PCP reports that patient usually misses many doses of basal insulin and had multiple excuses why she doesn't take regularly and benefits check was done last admission and basal lantus insulin would be cost of $35 or less for patient.  -- we have restarted on basal bolus insulin weight based and will monitor CBG closely and make adjustments as needed to try to improve glycemic control.   Hypothyroidism following radioiodine therapy -- long history of poor compliance with taking thyroid supplement -- did not check TSH at this time as we know her TSH remains elevated due to poor compliance and would not want to adjust dose during this acute visit  -- pt should follow up with her PCP and endocrinologist for close monitoring.     Vitamin D deficiency -- check 25-OH vitamin D as she is likely not taking supplement at this time   DVT prophylaxis: enoxaparin   Code Status: full   Family Communication: none present   Disposition Plan: TBD   Consults called: TBD   Admission status: INP  Level of care: Telemetry Standley Dakins MD [facility] How to contact the [facility] Attending or Consulting provider 7A - 7P or covering provider during after hours 7P -7A, for this patient?  Check the care team in [facility] and look for a) attending/consulting [facility] provider listed and b) the [facility] team listed Log into [email or web address] and use [facility] [place]'s universal password to access.  If you do not have the password, please contact the hospital operator. Locate the [facility] provider you are looking for under [facility] and page to a number  that you can be directly reached. If you still have difficulty reaching the provider, please page the [facility] (Director on Call) for the Hospitalists listed on amion for assistance.   If 7PM-7AM, please contact night-coverage [email or web address] Password [facility]  08/19/2021, 2:35 PM

## 2021-08-19 NOTE — Assessment & Plan Note (Signed)
--   secondary to poor compliance with taking insulin.  This has been well documented by her PCP the multiple attempts made to assist patient with getting medications, multiple referrals to endocrinology and no show appointments -- See insulin orders: given her extreme low weight we have ordered weight based insulin dosing and will titrate based on response.

## 2021-08-19 NOTE — ED Provider Notes (Signed)
[facility] EMERGENCY DEPARTMENT Provider Note   CSN: [id number] Arrival date & time: 08/19/21  9417     History  Chief Complaint  Patient presents with   Shortness of Breath   Diarrhea    Autumn Daniels is a 61 y.o. female.   Shortness of Breath Diarrhea  This patient is a 61 year old female, she is a previous smoker, she does not smoke that much anymore, she is a known diabetic on insulin, she has hypothyroidism on Synthroid, she presents to the hospital with several complaints primarily shortness of breath and difficulty breathing.  She states that she was up the better part of the night not feeling very well and feeling like she was short of breath.  She was found to have an oxygen level in the mid 80% range on room air, she does not use oxygen at home.  Paramedics placed her on 2 L and she came up to the low 90s.  She is coughing occasionally but denies fevers or chills, denies nausea or vomiting though she has had diarrhea this week.  She has endorse some swelling of her ankles but cannot tell me how long it has been going on, she denies having any prior cardiac disease that she is aware of and has no prior lung disease that she is aware of.  She lives by herself.  Home Medications Prior to Admission medications   Medication Sig Start Date End Date Taking? Authorizing Provider  albuterol (VENTOLIN HFA) 108 (90 Base) MCG/ACT inhaler Inhale 2 puffs into the lungs every 4 (four) hours as needed for wheezing or shortness of breath (or coughing). 01/02/21  Yes Dione Booze, MD  Cholecalciferol (VITAMIN D3) 1.25 MG (50000 UT) CAPS Take 1 capsule by mouth once a week. 06/21/21  Yes [provider]  gabapentin (NEURONTIN) 400 MG capsule Take 1 capsule (400 mg total) by mouth 3 (three) times daily. 05/04/18  Yes Sabas Sous, MD  insulin glargine (LANTUS) 100 UNIT/ML Solostar Pen Inject 10 Units into the skin daily. 03/21/21  Yes Lonia Blood, MD  levothyroxine (SYNTHROID)  150 MCG tablet Take 150 mcg by mouth daily. 08/05/21  Yes [provider]  LORazepam (ATIVAN) 0.5 MG tablet Take 0.5 mg by mouth every 8 (eight) hours as needed. for anxiety 06/23/17  Yes [provider]  NOVOLOG FLEXPEN 100 UNIT/ML FlexPen Inject 4-16 Units into the skin 3 (three) times daily. SIG:70-130 0 units 131-180 4 units 181-240 8 units 241-300 10 units 301-350 12 units 351-400 16 Units >400 20 units and call MD 07/19/21  Yes [provider]  rOPINIRole (REQUIP) 1 MG tablet Take 1 mg by mouth at bedtime.  04/18/16  Yes [provider]  traMADol (ULTRAM) 50 MG tablet Take 100 mg by mouth every 8 (eight) hours as needed for moderate pain.    Yes [provider]  HYDROcodone-acetaminophen (NORCO/VICODIN) 5-325 MG tablet One tablet every six hours for pain.  Limit 7 days. Patient not taking: Reported on 08/19/2021 11/24/17   Darreld Mclean, MD  Insulin Pen Needle 32G X 4 MM MISC 10 Units by Does not apply route daily. 03/21/21   Lonia Blood, MD  potassium chloride SA (KLOR-CON M) 20 MEQ tablet Take 2 tablets (40 mEq total) by mouth 3 (three) times daily for 4 days. 03/21/21 03/25/21  Lonia Blood, MD  potassium chloride SA (KLOR-CON M) 20 MEQ tablet Take 1 tablet (20 mEq total) by mouth 3 (three) times daily. Patient not taking:  Reported on 08/19/2021 03/25/21   Lonia Blood, MD  Potassium 99 MG TABS Take 1 tablet by mouth daily as needed (for leg cramping).  06/28/20  [provider]      Allergies    Penicillins and Sulfa antibiotics    Review of Systems   Review of Systems  Respiratory:  Positive for shortness of breath.   Gastrointestinal:  Positive for diarrhea.  All other systems reviewed and are negative.  Physical Exam Updated Vital Signs BP (!) 150/85   Pulse 70   Temp 98.7 F (37.1 C) (Oral)   Resp 15   Ht 1.499 m ( )   Wt 40.8 kg   SpO2 96%   BMI 18.18 kg/m  Physical Exam Vitals and nursing note  reviewed.  Constitutional:      General: She is not in acute distress.    Appearance: She is well-developed.  HENT:     Head: Normocephalic and atraumatic.     Nose: Nose normal.     Mouth/Throat:     Pharynx: No oropharyngeal exudate.  Eyes:     General: No scleral icterus.       Right eye: No discharge.        Left eye: No discharge.     Conjunctiva/sclera: Conjunctivae normal.     Pupils: Pupils are equal, round, and reactive to light.  Neck:     Thyroid: No thyromegaly.     Vascular: No JVD.     Comments: No JVD Cardiovascular:     Rate and Rhythm: Normal rate and regular rhythm.     Heart sounds: Normal heart sounds. No murmur heard.   No friction rub. No gallop.  Pulmonary:     Effort: Pulmonary effort is normal. No respiratory distress.     Breath sounds: Rhonchi present. No wheezing or rales.     Comments: Decreased lung sounds bilaterally, rhonchi occasionally, occasional weak cough Abdominal:     General: Bowel sounds are normal. There is no distension.     Palpations: Abdomen is soft. There is no mass.     Tenderness: There is no abdominal tenderness.  Musculoskeletal:        General: No tenderness. Normal range of motion.     Cervical back: Normal range of motion and neck supple.     Right lower leg: Edema present.     Left lower leg: Edema present.     Comments: 2+ pitting edema about the ankles extending up to the mid tibia  Lymphadenopathy:     Cervical: No cervical adenopathy.  Skin:    General: Skin is warm and dry.     Findings: No erythema or rash.  Neurological:     General: No focal deficit present.     Mental Status: She is alert.     Coordination: Coordination normal.  Psychiatric:        Behavior: Behavior normal.    ED Results / Procedures / Treatments   Labs (all labs ordered are listed, but only abnormal results are displayed) Labs Reviewed  BRAIN NATRIURETIC PEPTIDE - Abnormal; Notable for the following components:      Result Value    B Natriuretic Peptide 200.0 (*)    All other components within normal limits  COMPREHENSIVE METABOLIC PANEL - Abnormal; Notable for the following components:   Potassium 2.4 (*)    Chloride 89 (*)    CO2 41 (*)    Glucose, Bld 391 (*)    BUN <5 (*)  Creatinine, Ser 0.41 (*)    Calcium 8.6 (*)    Total Protein 6.1 (*)    Albumin 3.1 (*)    All other components within normal limits  URINALYSIS, ROUTINE W REFLEX MICROSCOPIC - Abnormal; Notable for the following components:   Color, Urine STRAW (*)    pH 9.0 (*)    Glucose, UA >=500 (*)    Ketones, ur 5 (*)    Leukocytes,Ua TRACE (*)    Bacteria, UA RARE (*)    All other components within normal limits  BLOOD GAS, ARTERIAL - Abnormal; Notable for the following components:   pH, Arterial 7.55 (*)    pCO2 arterial 55 (*)    pO2, Arterial 77 (*)    Bicarbonate 48.1 (*)    Acid-Base Excess 22.1 (*)    All other components within normal limits  RESP PANEL BY RT-PCR (FLU A&B, COVID) ARPGX2  CULTURE, BLOOD (ROUTINE X 2)  CULTURE, BLOOD (ROUTINE X 2)  URINE CULTURE  MAGNESIUM  CBC WITH DIFFERENTIAL/PLATELET  LACTIC ACID, PLASMA  PROTIME-INR  APTT  TROPONIN I (HIGH SENSITIVITY)    EKG EKG Interpretation  Date/Time:  Monday Aug 19 2021 10:19:40 EDT Ventricular Rate:  71 PR Interval:  159 QRS Duration: 77 QT Interval:  398 QTC Calculation: 433 R Axis:   -49 Text Interpretation: Sinus rhythm Inferior infarct, old Anterior infarct, old since last tracing no significant change Confirmed by Eber Hong ([id number]) on 08/19/2021 10:24:03 AM  Radiology DG Chest Port 1 View  Result Date: 08/19/2021 CLINICAL DATA:  Cough and shortness of breath EXAM: PORTABLE CHEST 1 VIEW COMPARISON:  02/11/2021 FINDINGS: Retrocardiac airspace opacity. Artifact from EKG leads. Background interstitial coarsening which is generalized. No visible effusion or pneumothorax. Normal heart size and mediastinal contours. IMPRESSION: Retrocardiac opacity,  history suggesting pneumonia. There was a pulmonary nodule in this area on 2021 chest CT, recommend follow-up chest CT after completed antibiotics. Electronically Signed   By: Tiburcio Pea M.D.   On: 08/19/2021 10:50    Procedures .Critical Care Performed by: Eber Hong, MD Authorized by: Eber Hong, MD   Critical care provider statement:    Critical care time (minutes):  30   Critical care time was exclusive of:  Separately billable procedures and treating other patients and teaching time   Critical care was necessary to treat or prevent imminent or life-threatening deterioration of the following conditions:  Respiratory failure and metabolic crisis   Critical care was time spent personally by me on the following activities:  Development of treatment plan with patient or surrogate, discussions with consultants, evaluation of patient's response to treatment, examination of patient, ordering and review of laboratory studies, ordering and review of radiographic studies, ordering and performing treatments and interventions, pulse oximetry, re-evaluation of patient's condition, review of old charts and obtaining history from patient or surrogate   I assumed direction of critical care for this patient from another provider in my specialty: no     Care discussed with: admitting provider   Comments:          Medications Ordered in ED Medications  lactated ringers infusion ( Intravenous New Bag/Given 08/19/21 1104)  cefTRIAXone (ROCEPHIN) 1 g in sodium chloride 0.9 % 100 mL IVPB (has no administration in time range)  azithromycin (ZITHROMAX) 500 mg in sodium chloride 0.9 % 250 mL IVPB (has no administration in time range)  potassium chloride 10 mEq in 100 mL IVPB (10 mEq Intravenous New Bag/Given 08/19/21 1306)  levofloxacin (LEVAQUIN) IVPB  750 mg (0 mg Intravenous Stopped 08/19/21 1239)  ipratropium-albuterol (DUONEB) 0.5-2.5 (3) MG/3ML nebulizer solution 3 mL (3 mLs Nebulization Given  08/19/21 1307)    ED Course/ Medical Decision Making/ A&P                           Medical Decision Making Amount and/or Complexity of Data Reviewed Labs: ordered. Radiology: ordered. ECG/medicine tests: ordered.  Risk Prescription drug management. Decision regarding hospitalization.   This patient presents to the ED for concern of shortness of breath and hypoxia, this involves an extensive number of treatment options, and is a complaint that carries with it a high risk of complications and morbidity.  The differential diagnosis includes pneumonia, coronary syndrome, congestive heart failure, with the diarrhea for the last week would also consider severe electrolyte abnormalities, she may have pulmonary edema, pleural effusion, less likely to be pulmonary embolism given lack of tachycardia   Co morbidities that complicate the patient evaluation  Diabetes, insulin requiring, 1 week of diarrhea   Additional history obtained:  Additional history obtained from paramedics and the medical record External records from outside source obtained and reviewed including recent admission to the hospital about 5 months ago, she was admitted with hyperglycemia and hypokalemia.  EKG had a prolonged QT at 605 at that time, potassium was less than 2.0, she was admitted treated and released in improved condition.   Lab Tests:  I Ordered, and personally interpreted labs.  The pertinent results include: Significant hypokalemia, potassium supplementation was given, also has no leukocytosis, she is hyperglycemic.   Imaging Studies ordered:  I ordered imaging studies including portable chest x-ray I independently visualized and interpreted imaging which showed retrocardiac opacity consistent with possible pneumonia I agree with the radiologist interpretation   Cardiac Monitoring: / EKG:  The patient was maintained on a cardiac monitor.  I personally viewed and interpreted the cardiac monitored  which showed an underlying rhythm of: Normal sinus rhythm   Consultations Obtained:  I requested consultation with the hospitalist Dr. Laural BenesJohnson,  and discussed lab and imaging findings as well as pertinent plan - they recommend: Admission to the hospital   Problem List / ED Course / Critical interventions / Medication management  Antibiotics given for pneumonia, potassium supplementation given as well, she was requiring oxygen for hypoxic respiratory failure I ordered medication including potassium supplementation and Levaquin for low potassium and pneumonia Reevaluation of the patient after these medicines showed that the patient improved I have reviewed the patients home medicines and have made adjustments as needed   Social Determinants of Health:  Ongoing tobacco use, patient counseled   Test / Admission - Considered:  Admitted to hospital.         Final Clinical Impression(s) / ED Diagnoses Final diagnoses:  Pneumonia of left lower lobe due to infectious organism  Hypokalemia  Hypoxia    Rx / DC Orders ED Discharge Orders     None         Eber HongMiller, Antonique Langford, MD 08/19/21 1315

## 2021-08-19 NOTE — Progress Notes (Signed)
Pt's two family members Huel Cote and Evalee Jefferson (cousins) who assist this pt at home are here now to visit. Pt states that they help her and are going to be looking after her pets while she in the hospital. They voiced concern to nursing staff that pt is unable to adequately care for herself and has cognitive issues. They state pt is a hoarder and her home is covered in dog and cat feces, rotten food and dirty clothes. They state that her son was killed 18 months ago and pt has lost both her aunt and her brother in a short time period as well. They would like to set up a meeting for tomorrow to discuss current situation and long-term options for best pt care and pt safety. MD Laural Benes and TOC notified.  Per nursing staff observation, pt is unkempt, clothes are covered in cigarette burns, old urine & stool. Pt exhibiting some type of cognitive/memory issue as demonstrated by impulsive behavior with confused conversation at times, poor recall of instructions. She has activated bed alarm multiple time getting OOB without calling for help (pt is high fall risk) but could not tell me why she was getting up. Pt stated on admission that she has had many recent falls, says, "I fall a lot."  Pt has also had 3 incontinent diarrhea stools since arrival to unit (< 3 hours) and is saying she is starving because she hasn't eaten in two days, (but has already eaten two meals since to unit.) When questioned about meals and medicines, pt looked at me and stated, "I'm sorry, I don't understand what you're saying. I get confused sometimes, all mixed up. What do you mean?" Asked questions multiple times in different ways, pt only states, "I don't know honey, I can't keep up with all that. I'm fine though, me and my puppies and kitties."

## 2021-08-19 NOTE — Assessment & Plan Note (Signed)
--   PCP reported that SLE has been unstable as patient refuses to follow up with rheumatologist

## 2021-08-19 NOTE — Assessment & Plan Note (Signed)
--   pt presents in a severely emaciated state -- pt is at high risk for adverse outcome in this current condition -- consult to [facility] as adult protection services may need to get involved due to extreme self harm and neglect

## 2021-08-19 NOTE — Progress Notes (Signed)
Elink following for sepsis protocol. 

## 2021-08-19 NOTE — Assessment & Plan Note (Addendum)
--   unfortunately this patient is at HIGH RISK for acute and chronic complications of poorly controlled diabetes mellitus in addition to her other poorly controlled comorbidities -- she has been counseled by admitting physician and documented that her PCP has also counseled with her and tried to work and assist her on multiple occasions.  --psych/TTS consult requested for capacity evaluation --SLP evaluation for cognition and MMSE exam

## 2021-08-19 NOTE — Assessment & Plan Note (Signed)
--   check 25-OH vitamin D: 57.85 no need for replacement

## 2021-08-19 NOTE — Assessment & Plan Note (Signed)
--   long history of poor compliance with taking thyroid supplement -- did not check TSH at this time as we know her TSH remains elevated due to poor compliance and would not want to adjust dose during this acute visit  -- pt should follow up with her PCP and endocrinologist for close monitoring.

## 2021-08-19 NOTE — Assessment & Plan Note (Signed)
--   she is clearly severely total body depleted of potassium for many reasons including diarrhea, hyperglycemia, ketoacidosis, etc.  -- IV and oral potassium ordered -- follow up magnesium and replace if needed -- anticipating need for more potassium replacement needs when hyperglycemia is starting to correct and potassium enters the cells  -- for now, monitoring BMP and Mg closely

## 2021-08-19 NOTE — Assessment & Plan Note (Signed)
--   Patient presenting with a new oxygen requirement and hypoxemia on admission PCO2 of 55. -- She also has findings of pneumonia on chest x-ray being treated with IV antibiotic therapy. -- Patient also has underlying COPD given long history of tobacco abuse.

## 2021-08-20 ENCOUNTER — Other Ambulatory Visit (HOSPITAL_COMMUNITY): Payer: Self-pay

## 2021-08-20 DIAGNOSIS — J189 Pneumonia, unspecified organism: Secondary | ICD-10-CM | POA: Diagnosis not present

## 2021-08-20 DIAGNOSIS — J42 Unspecified chronic bronchitis: Secondary | ICD-10-CM | POA: Diagnosis not present

## 2021-08-20 DIAGNOSIS — F418 Other specified anxiety disorders: Secondary | ICD-10-CM | POA: Diagnosis not present

## 2021-08-20 DIAGNOSIS — J9601 Acute respiratory failure with hypoxia: Secondary | ICD-10-CM | POA: Diagnosis not present

## 2021-08-20 DIAGNOSIS — T383X5A Adverse effect of insulin and oral hypoglycemic [antidiabetic] drugs, initial encounter: Secondary | ICD-10-CM

## 2021-08-20 DIAGNOSIS — E160 Drug-induced hypoglycemia without coma: Secondary | ICD-10-CM | POA: Diagnosis not present

## 2021-08-20 LAB — URINE CULTURE: Culture: 50000 — AB

## 2021-08-20 LAB — GLUCOSE, CAPILLARY
Glucose-Capillary: 162 mg/dL — ABNORMAL HIGH (ref 70–99)
Glucose-Capillary: 182 mg/dL — ABNORMAL HIGH (ref 70–99)
Glucose-Capillary: 147 mg/dL — ABNORMAL HIGH (ref 70–99)
Glucose-Capillary: 51 mg/dL — ABNORMAL LOW (ref 70–99)
Glucose-Capillary: 109 mg/dL — ABNORMAL HIGH (ref 70–99)
Glucose-Capillary: 164 mg/dL — ABNORMAL HIGH (ref 70–99)

## 2021-08-20 LAB — BASIC METABOLIC PANEL
Anion gap: 11 (ref 5–15)
BUN: 7 mg/dL (ref 6–20)
CO2: 34 mmol/L — ABNORMAL HIGH (ref 22–32)
Calcium: 8.8 mg/dL — ABNORMAL LOW (ref 8.9–10.3)
Chloride: 94 mmol/L — ABNORMAL LOW (ref 98–111)
Creatinine, Ser: 0.36 mg/dL — ABNORMAL LOW (ref 0.44–1.00)
GFR, Estimated: >60 mL/min (ref >60)
Glucose, Bld: 184 mg/dL — ABNORMAL HIGH (ref 70–99)
Potassium: 2.1 mmol/L — CL (ref 3.5–5.1)
Sodium: 139 mmol/L (ref 135–145)

## 2021-08-20 LAB — VITAMIN D 25 HYDROXY (VIT D DEFICIENCY, FRACTURES): Vit D, 25-Hydroxy: 57.85 ng/mL (ref 30–100)

## 2021-08-20 LAB — VITAMIN B12: Vitamin B-12: 180 pg/mL (ref 180–914)

## 2021-08-20 LAB — MAGNESIUM: Magnesium: 1.9 mg/dL (ref 1.7–2.4)

## 2021-08-20 MED ORDER — TUBERCULIN PPD 5 UNIT/0.1ML ID SOLN
5.0000 [IU] | Freq: Once | INTRADERMAL | Status: DC
Start: 2021-08-20 — End: 2021-08-21
  Administered 2021-08-20: 5 [IU] via INTRADERMAL
  Filled 2021-08-20: qty 0.1

## 2021-08-20 MED ORDER — POTASSIUM CHLORIDE 20 MEQ PO PACK: 40.0000 meq | PACK | Freq: Once | ORAL | Status: DC

## 2021-08-20 MED ORDER — INSULIN GLARGINE-YFGN 100 UNIT/ML ~~LOC~~ SOLN
12.0000 [IU] | Freq: Every day | SUBCUTANEOUS | Status: DC
  Administered 2021-08-21: 12 [IU] via SUBCUTANEOUS
  Filled 2021-08-20 (×2): qty 0.12

## 2021-08-20 MED ORDER — INSULIN ASPART 100 UNIT/ML IJ SOLN
4.0000 [IU] | Freq: Three times a day (TID) | INTRAMUSCULAR | Status: DC
  Administered 2021-08-21: 4 [IU] via SUBCUTANEOUS

## 2021-08-20 MED ORDER — MIRTAZAPINE 15 MG PO TBDP
15.0000 mg | ORAL_TABLET | Freq: Every day | ORAL | Status: DC
  Administered 2021-08-20: 15 mg via ORAL
  Filled 2021-08-20 (×3): qty 1

## 2021-08-20 MED ORDER — POTASSIUM CHLORIDE 20 MEQ PO PACK
60.0000 meq | PACK | Freq: Once | ORAL | Status: AC
  Administered 2021-08-20: 60 meq via ORAL
  Filled 2021-08-20: qty 3

## 2021-08-20 NOTE — Plan of Care (Signed)
  Problem: Acute Rehab PT Goals(only PT should resolve) Goal: Pt Will Go Supine/Side To Sit Outcome: Progressing Flowsheets (Taken 08/20/2021 1203) Pt will go Supine/Side to Sit:  Independently  with modified independence Goal: Patient Will Transfer Sit To/From Stand Outcome: Progressing Flowsheets (Taken 08/20/2021 1203) Patient will transfer sit to/from stand:  Independently  with modified independence Goal: Pt Will Transfer Bed To Chair/Chair To Bed Outcome: Progressing Flowsheets (Taken 08/20/2021 1203) Pt will Transfer Bed to Chair/Chair to Bed:  with modified independence  Independently Goal: Pt Will Ambulate Outcome: Progressing Flowsheets (Taken 08/20/2021 1203) Pt will Ambulate:  > 125 feet  with modified independence  with least restrictive assistive device   12:04 PM, 08/20/21 Ocie Bob, MPT Physical Therapist with [facility] [phone] office [phone] mobile phone

## 2021-08-20 NOTE — TOC Benefit Eligibility Note (Signed)
Patient Teacher, English as a foreign language completed.    The patient is currently admitted and upon discharge could be taking True Metrix Air Glucose Meter w/Device Kit.  The current 30 day co-pay is, $1.90.   The patient is currently admitted and upon discharge could be taking Accu Check Guide w/Device kit.  The current 30 day co-pay is, $2.59.   The patient is currently admitted and upon discharge could be taking Freestyle kit.  Non Formulary  The patient is insured through [place] Patient Advocate Specialist [place] Patient Advocate Team Direct Number: [phone]  Fax: [phone]

## 2021-08-20 NOTE — TOC Progression Note (Addendum)
Transition of Care [facility]) - Progression Note    Patient Details  Name: Autumn Daniels MRN: [id number] Date of Birth: 11/26/1960  [facility]) CM/SW Contact  Karn Cassis, [place] Phone Number: 08/20/2021, 10:07 AM  Clinical Narrative:   TOC made aware of family's concerns for pt at home. LCSW made APS report with information from RN notes. Report made to Karrie Doffing at [facility] DSS. Per DSS, it appears that case from November 2022 was screened out. TOC will continue to follow.   Update: Call received from supervisor, Nilda Calamity who reports case is screened in and someone will assess pt within 72 hours.        Expected Discharge Plan and Services                                                 Social Determinants of Health (SDOH) Interventions    Readmission Risk Interventions     View : No data to display.

## 2021-08-20 NOTE — Assessment & Plan Note (Signed)
--   reduced lantus to 12 units and reduced mealtime novolog to 4 units -- DC HS sliding scale coverage, continue CBG 5 times per day

## 2021-08-20 NOTE — Progress Notes (Signed)
Inpatient Diabetes Program Recommendations  AACE/ADA: New Consensus Statement on Inpatient Glycemic Control (2015)  Target Ranges:  Prepandial:   less than 140 mg/dL      Peak postprandial:   less than 180 mg/dL (1-2 hours)      Critically ill patients:  140 - 180 mg/dL   Lab Results  Component Value Date   GLUCAP 182 (H) 08/20/2021   HGBA1C 14.4 (H) 08/19/2021    Review of Glycemic Control  Latest Reference Range & Units 08/19/21 16:12 08/19/21 21:20 08/20/21 03:31  Glucose-Capillary 70 - 99 mg/dL 409 (H) 811 (H) 914 (H)   Diabetes history: DM 1 Outpatient Diabetes medications:  Lantus 10 units daily Novolog 4-16 units tid with meals Current orders for Inpatient glycemic control:  Novolog sensitive tid with meals and HS Novolog 6 units tid with meals Semglee 16 units daily  Inpatient Diabetes Program Recommendations:    Spoke with patient regarding DM control and that A1C is worse then her last admit. Review of chart indicates that even though patient was prescribed Lantus in 12/22, she is not taking it.  I asked patient about her compliance taking insulin and she stated that she takes medications as prescribed.  I inquired about her injection sites to make sure she is rotating and she states that the [facility] clinic told her to inject in her thighs.  Explained the importance of rotating sites to prevent scar tissue. She does not use a CGM stating that it is too expensive.  May be more affordable now?? Encouraged patient to recheck and will see if TOC can let her know out of pocket cost.  Told patient that her blood sugars appear much better controlled in the hospital. Patient agreed.  She states she has no needs at this time.  Will follow.  Thanks,  Beryl Meager, RN, BC-ADM Inpatient Diabetes Coordinator Pager [phone]  (8a-5p)

## 2021-08-20 NOTE — Assessment & Plan Note (Signed)
--   continue levofloxacin as ordered -- continue supportive measures

## 2021-08-20 NOTE — TOC Initial Note (Signed)
Transition of Care [facility]) - Initial/Assessment Note    Patient Details  Name: Autumn Daniels MRN: [id number] Date of Birth: [birth year]  Transition of Care [facility]) CM/SW Contact:    Shade Flood, LCSW Phone Number: 08/20/2021, 3:10 PM  Clinical Narrative:                  Pt admitted from home. Received TOC consult for concerns related to home environment and pt's ability to care for herself. APS referral made earlier today based on report of conditions in pt's home and pt's presentation to the ED. Report was screened in for follow up evaluation with APS.   SLP has completed a cognitive evaluation and Behavioral Health has completed a capacity evaluation.   TOC met with pt in her room along with two of pt's cousins and pt's minister. Pt reports that she does find herself experiencing cognitive/memory deficits from time to time. She initially stated that she prefers to return home at dc. After further discussion pt agrees that the condition of her home is not ideal at the moment and that a short term stay at an ALF could be beneficial while she and her family work on cleaning up the home.   Provider options reviewed and TOC will refer pt to [facility] at pt request. Requested clinical will be faxed and staff from the ALF will need to come and assess pt here at [facility] if she remains hospitalized tomorrow.  Updated MD who will order TB skin test. Assigned TOC will follow up in AM.   Expected Discharge Plan: Assisted Living Barriers to Discharge: Continued Medical Work up   Patient Goals and CMS Choice Patient states their goals for this hospitalization and ongoing recovery are:: get better CMS Medicare.gov Compare Post Acute Care list provided to:: Patient Choice offered to / list presented to : Patient  Expected Discharge Plan and Services Expected Discharge Plan: Assisted Living In-house Referral: Clinical Social Work   Post Acute Care Choice:  (ALF) Living arrangements for the  past 2 months: Single Family Home                                      Prior Living Arrangements/Services Living arrangements for the past 2 months: Single Family Home Lives with:: Pets Patient language and need for interpreter reviewed:: Yes Do you feel safe going back to the place where you live?: Yes        Care giver support system in place?: No (comment)   Criminal Activity/Legal Involvement Pertinent to Current Situation/Hospitalization: No - Comment as needed  Activities of Daily Living Home Assistive Devices/Equipment: CBG Meter, Eyeglasses, Walker (specify type) ADL Screening (condition at time of admission) Patient's cognitive ability adequate to safely complete daily activities?: Yes Is the patient deaf or have difficulty hearing?: No Does the patient have difficulty seeing, even when wearing glasses/contacts?: No Does the patient have difficulty concentrating, remembering, or making decisions?: Yes Patient able to express need for assistance with ADLs?: Yes Does the patient have difficulty dressing or bathing?: No Independently performs ADLs?: Yes (appropriate for developmental age) Does the patient have difficulty walking or climbing stairs?: No Weakness of Legs: None Weakness of Arms/Hands: None  Permission Sought/Granted Permission sought to share information with : Facility Art therapist granted to share information with : Yes, Verbal Permission Granted     Permission granted to share info w AGENCY: [facility]        Emotional Assessment Appearance:: Appears stated age Attitude/Demeanor/Rapport: Engaged Affect (typically observed): Pleasant Orientation: : Oriented to Self, Oriented to Place, Oriented to  Time, Oriented to Situation Alcohol / Substance Use: Not Applicable Psych Involvement: No (comment)  Admission diagnosis:  Hypokalemia [E87.6] Hypoxia [R09.02] CAP (community acquired pneumonia) [J18.9] Pneumonia of left  lower lobe due to infectious organism [J18.9] Patient Active Problem List   Diagnosis Date Noted   Anxiety with depression 08/20/2021   CAP (community acquired pneumonia) 08/19/2021   Acute respiratory failure with hypoxemia ([place]) 08/19/2021   Hyperglycemia 08/19/2021   Failure to thrive in adult 08/19/2021   Noncompliance with medication regimen 08/19/2021   COPD (chronic obstructive pulmonary disease) ([place]) 08/19/2021   Protein-calorie malnutrition, severe 03/21/2021   Hypokalemia 02/11/2021   Acute blood loss anemia 07/20/2014   Vitamin D deficiency 07/07/2014   Hypothyroidism following radioiodine therapy    Type 1 diabetes mellitus with other specified complication (HCC)    GERD (gastroesophageal reflux disease)    Lupus ([place])    Tibial plateau fracture 06/29/2014   Closed fracture of thoracic vertebral body ([place]) 09/27/2013   PCP:  Jettie Booze, NP Pharmacy:   [place], [place] - [address] [place] [address] Phone: [phone] Fax: [place], [place] - [address] [place] [address] [place] [address] Phone: [phone] Fax: [phone]     Social Determinants of Health (SDOH) Interventions    Readmission Risk Interventions     View : No data to display.

## 2021-08-20 NOTE — Assessment & Plan Note (Signed)
--   behavioral health evaluation requested with TTS/psych NP

## 2021-08-20 NOTE — Progress Notes (Addendum)
Date and time results received: 08/20/21 0634   Test: Potassium  Critical Value: 2.1  Name of Provider Notified: Zierle-Ghosh MD  Orders Received? Or Actions Taken?: No new orders at this time.

## 2021-08-20 NOTE — Progress Notes (Addendum)
Initial Nutrition Assessment  DOCUMENTATION CODES:   Severe malnutrition in context of chronic illness  INTERVENTION:  Snacks TID between meals  Check B-12, Vitamin C   Nutrition Education- Diabetes and Malnutrition attached  NUTRITION DIAGNOSIS:   Severe Malnutrition related to chronic illness (poorly controlled diabetes, lupus) as evidenced by mild fat depletion, moderate fat depletion, moderate muscle depletion, severe muscle depletion, energy intake < 75% for > or equal to 1 month (diet recall - 1 meal daily).   GOAL:  Patient will meet greater than or equal to 90% of their needs  MONITOR:  PO intake, Labs, Weight trends, Skin  REASON FOR ASSESSMENT:   Consult (weight loss)    ASSESSMENT: Patient is a 61 yo female with hx of Lupus, recurrent UTI's, Type 1 diabetes, chronic depression, GERD and vitamin D deficiency. Poor compliance with medical management of diabetes and lupus per MD.   08/19/21- Her A1C- 14.4%.   Presents from home with SOB, weakness and diarrhea one week PTA.  Diet recall: difficult historian. Reports she eats one meal most days. She was sick recently and ate mainly soups during that week. Patient shops and prepares her own foods when well. Patient complains that she ravenous and feels like she is starving.   Meal intake: 100%, 75%, 50% respectively. Able to feed herself. Empty container of orange sherbet on tray table. Nursing reports she is eating one thing after the other. After lunch today requested frozen meal -ate 75% and asked RD for sandwich. Provided [place] sandwich and diet gingerale. Nursing is checking patient blood glucose before allowing her to eat again.   Patient doesn't like Ensure type supplements or milk but would like snacks between meals (sandwich) or scoop of chicken salad and crackers.  Patient weights reviewed. 05/04/18->03/20/21 pt weighed 36-39 kg range. Currently 40.7 kg -chronically underweight. Severe malnutrition (Diabetes,  Lupus).    Medications: novolog (with meals and at bedtime), vitamin D3, synthroid.  Levaquin- drip     Latest Ref Rng & Units 08/20/2021    4:45 AM 08/19/2021   10:35 AM 03/21/2021   12:43 PM  BMP  Glucose 70 - 99 mg/dL 333   545   625    BUN 6 - 20 mg/dL 7   <5   <5    Creatinine 0.44 - 1.00 mg/dL 6.38   9.37   3.42    Sodium 135 - 145 mmol/L 139   138   135    Potassium 3.5 - 5.1 mmol/L 2.1   2.4   3.5    Chloride 98 - 111 mmol/L 94   89   102    CO2 22 - 32 mmol/L 34   41   28    Calcium 8.9 - 10.3 mg/dL 8.8   8.6   8.4        NUTRITION - FOCUSED PHYSICAL EXAM:  Flowsheet Row Most Recent Value  Orbital Region Moderate depletion  Upper Arm Region Mild depletion  Buccal Region Mild depletion  Temple Region Moderate depletion  Clavicle Bone Region Moderate depletion  Clavicle and Acromion Bone Region Mild depletion  Scapular Bone Region Unable to assess  Dorsal Hand Moderate depletion  Patellar Region Moderate depletion  Anterior Thigh Region Severe depletion  Posterior Calf Region Severe depletion  Edema (RD Assessment) Moderate  Hair Reviewed  Eyes Reviewed  Mouth Reviewed  Skin Reviewed  Nails Reviewed       Diet Order:   Diet Order  Diet Carb Modified Fluid consistency: Thin; Room service appropriate? Yes  Diet effective now                   EDUCATION NEEDS:  Education needs have been addressed  Skin:  Skin Assessment: Reviewed RN Assessment  Last BM:  5/28  Height:   Ht Readings from Last 1 Encounters:  08/19/21 4\' 11"  (1.499 m)    Weight:   Wt Readings from Last 1 Encounters:  08/19/21 40.7 kg    Ideal Body Weight:   45 kg  BMI:  Body mass index is 18.14 kg/m.  Estimated Nutritional Needs:   Kcal:  1300-1500  Protein:  60-70 gr  Fluid:  >1200 ml daily  08/21/21 MS,RD,CSG,LDN Contact: Royann Shivers

## 2021-08-20 NOTE — Evaluation (Signed)
Physical Therapy Evaluation Patient Details Name: Autumn Daniels MRN: [id number] DOB: 12-24-1960 Today's Date: 08/20/2021  History of Present Illness  Autumn Daniels is a 61 y/o female with multiple serious medical comorbidities including type 1 diabetes mellitus, chronic depression, GERD, systemic lupus, frequent UTIs, fibromyalgia, chronic tobacco abuse, hypokalemia, intermittent bouts of chronic diarrhea, long history of poor compliance with taking medicines, following up with physicians and following recommendations.  She continues to smoke cigarettes despite being counseled at length about the dangers of smoking and diabetes mellitus.  Patient has reported unstable lupus because she is refusing to follow-up with rheumatology.  She also has had multiple referrals and missed appointments with endocrinology but continues to no-show for appointments.  She rarely takes her basal insulin and her A1c remains greater than 10 up to greater than 12.  She has complications associated with long-term poorly controlled diabetes mellitus including diabetic neuropathy, nephropathy, retinopathy.  Patient is severely emaciated likely due to not taking enough insulin and has vitamin D deficiency, forgetfulness, multiple bouts of pneumonia, and recurrent hospitalizations.   Clinical Impression  Patient required [facility] raised for sitting up at bedside, slightly impulsive requiring occasional verbal cues for safety, demonstrates fair/good return for ambulation in room and hallways without loss of balance and tolerated sitting up in chair after therapy.  Patient will benefit from continued skilled physical therapy in hospital and recommended venue below to increase strength, balance, endurance for safe ADLs and gait.        Recommendations for follow up therapy are one component of a multi-disciplinary discharge planning process, led by the attending physician.  Recommendations may be updated based on patient status, additional  functional criteria and insurance authorization.  Follow Up Recommendations Home health PT    Assistance Recommended at Discharge Set up Supervision/Assistance  Patient can return home with the following  A little help with walking and/or transfers;A little help with bathing/dressing/bathroom;Assistance with cooking/housework;Help with stairs or ramp for entrance    Equipment Recommendations None recommended by PT  Recommendations for Other Services       Functional Status Assessment Patient has had a recent decline in their functional status and demonstrates the ability to make significant improvements in function in a reasonable and predictable amount of time.     Precautions / Restrictions Precautions Precautions: Fall Restrictions Weight Bearing Restrictions: No      Mobility  Bed Mobility Overal bed mobility: Modified Independent             General bed mobility comments: slightly increased time    Transfers Overall transfer level: Needs assistance Equipment used: None Transfers: Sit to/from Stand, Bed to chair/wheelchair/BSC Sit to Stand: Supervision   Step pivot transfers: Supervision       General transfer comment: fair/good return for completing transfers with occasional leaning on nearby objects for support    Ambulation/Gait Ambulation/Gait assistance: Supervision Gait Distance (Feet): 75 Feet Assistive device: None Gait Pattern/deviations: Decreased step length - right, Decreased step length - left, Decreased stride length Gait velocity: decreased     General Gait Details: slightly labored cadence initially with occasional leaning on nearby objects for support, after a few minutes able to ambulate safely without loss of balance  Stairs            Wheelchair Mobility    Modified Rankin (Stroke Patients Only)       Balance Overall balance assessment: Mild deficits observed, not formally tested  Pertinent Vitals/Pain Pain Assessment Pain Assessment: No/denies pain    Home Living Family/patient expects to be discharged to:: Private residence Living Arrangements: Alone Available Help at Discharge: Family;Available PRN/intermittently Type of Home: House Home Access: Stairs to enter Entrance Stairs-Rails: Right;Left;Can reach both Entrance Stairs-Number of Steps: 13 Alternate Level Stairs-Number of Steps: 14 Home Layout: Two level Home Equipment: None;Other (comment) Additional Comments: has walking stick    Prior Function Prior Level of Function : Independent/Modified Independent             Mobility Comments: Hydrographic surveyor, drives ADLs Comments: Pt indepndent for ADL's and IADL's     Hand Dominance   Dominant Hand: Right    Extremity/Trunk Assessment   Upper Extremity Assessment Upper Extremity Assessment: Overall WFL for tasks assessed    Lower Extremity Assessment Lower Extremity Assessment: Generalized weakness    Cervical / Trunk Assessment Cervical / Trunk Assessment: Normal  Communication   Communication: No difficulties  Cognition Arousal/Alertness: Awake/alert Behavior During Therapy: WFL for tasks assessed/performed, Impulsive Overall Cognitive Status: No family/caregiver present to determine baseline cognitive functioning                                 General Comments: slightly apprehensive with occasional impulsive behavior        General Comments      Exercises     Assessment/Plan    PT Assessment Patient needs continued PT services  PT Problem List Decreased strength;Decreased activity tolerance;Decreased balance;Decreased mobility       PT Treatment Interventions DME instruction;Gait training;Stair training;Functional mobility training;Therapeutic activities;Therapeutic exercise;Patient/family education;Balance training    PT Goals (Current goals can be found in the Care Plan  section)  Acute Rehab PT Goals Patient Stated Goal: return home PT Goal Formulation: With patient Time For Goal Achievement: 08/27/21 Potential to Achieve Goals: Good    Frequency Min 2X/week     Co-evaluation               AM-PAC PT "6 Clicks" Mobility  Outcome Measure Help needed turning from your back to your side while in a flat bed without using bedrails?: None Help needed moving from lying on your back to sitting on the side of a flat bed without using bedrails?: None Help needed moving to and from a bed to a chair (including a wheelchair)?: A Little Help needed standing up from a chair using your arms (e.g., wheelchair or bedside chair)?: A Little Help needed to walk in hospital room?: A Little Help needed climbing 3-5 steps with a railing? : A Little 6 Click Score: 20    End of Session   Activity Tolerance: Patient tolerated treatment well;Patient limited by fatigue Patient left: in chair;with call bell/phone within reach Nurse Communication: Mobility status PT Visit Diagnosis: Unsteadiness on feet (R26.81);Other abnormalities of gait and mobility (R26.89);Muscle weakness (generalized) (M62.81)    Time: [id number] PT Time Calculation (min) (ACUTE ONLY): 20 min   Charges:   PT Evaluation $PT Eval Moderate Complexity: 1 Mod PT Treatments $Therapeutic Activity: 8-22 mins        12:02 PM, 08/20/21 Lonell Grandchild, MPT Physical Therapist with [facility] [phone] office [phone] mobile phone

## 2021-08-20 NOTE — Consult Note (Signed)
Telepsych Consultation   Reason for Consult:  Capacity Evaluation  Referring Physician:  Dr. Laural BenesJohnson  Location of Patient: [facility]  Location of Provider: [facility]  Patient Identification: Autumn CurlingMary Kolton  MRN:  [id number]  Principal Diagnosis: Anxiety with depression  Diagnosis:  Principal Problem:   Anxiety with depression Active Problems:   Vitamin D deficiency   Hypothyroidism following radioiodine therapy   Type 1 diabetes mellitus with other specified complication (HCC)   GERD (gastroesophageal reflux disease)   Lupus (HCC)   Hypokalemia   Protein-calorie malnutrition, severe   CAP (community acquired pneumonia)   Acute respiratory failure with hypoxemia (HCC)   Hyperglycemia   Failure to thrive in adult   Noncompliance with medication regimen   COPD (chronic obstructive pulmonary disease) (HCC)   Total Time spent with patient: 1 hour  Subjective:   Autumn CurlingMary Pollitt is a 61 y.o. female patient.  HPI: As per chart review from [facility] Autumn CurlingMary Coutant is a 61 y/o female with multiple serious medical comorbidities including type 1 diabetes mellitus, chronic depression, GERD, systemic lupus, frequent UTIs, fibromyalgia, chronic tobacco abuse, hypokalemia, intermittent bouts of chronic diarrhea, long history of poor compliance with taking medicines, following up with physicians and following recommendations.  She continues to smoke cigarettes despite being counseled at length about the dangers of smoking and diabetes mellitus.  Patient has reported unstable lupus because she is refusing to follow-up with rheumatology.  She also has had multiple referrals and missed appointments with endocrinology but continues to no-show for appointments.  She rarely takes her basal insulin and her A1c remains greater than 10 up to greater than 12.  She has complications associated with long-term poorly controlled diabetes mellitus including diabetic neuropathy, nephropathy, retinopathy.   Patient is severely emaciated likely due to not taking enough insulin and has vitamin D deficiency, forgetfulness, multiple bouts of pneumonia, and recurrent hospitalizations. Patient presented to emergency department by EMS complaining of shortness of breath weakness and diarrhea x1 week.  She also complained of right wrist pain and swelling 7 out of 10.  She was noted to have a pulse ox of 88% on room air.  She is not on home oxygen.  She reports that most of the night she felt unwell and was short of breath more than her baseline.  She reports nonproductive cough no fever or chills.  She also reports ankle edema.  She lives by herself.  She was last hospitalized in December 2022.  At that time she had severe hypokalemia and severe weakness and was unable to get out of bed at that time.  Pt also has a history of leaving hospital against medical advice.    On assessment today via Telepsych, Patient is examined sitting calmly in her bed. Chart reviewed and findings shared with the tx team and discussed with the Dr. Lucianne MussKumar. A/O x 4. Speech clear and coherent and with normal pattern and volume. Thought process coherent and linear. Thought content  WNL. Memory, Judgement and Insight fair.  Reported mood as anxious, depressed, hopeless and worthless. States, son was shot and killed 11/2 years ago. Reported Affect as flat and depressed.    Patient denied SI, HI, AVH, paranoia or delusional.  Denied access to firearms and family hx of mental illness.  Denied being followed by a therapist or a psychiatrist.  Denied drug and alcohol use.  Endorsed smoking 11/2 pack of cigarette per day. Instructions provided on cessation of tobacco smoking and addressed adverse effects on the  body system especially with COPD and DM. Endorsed sleeping up to 4 hours last night and great appetite.   Reported being safe at home, and plans to be discharged to her home.  Discussed strengthen exercises and PT consult. Upon medication  review, no psychotropic drug noted, patient was started on Remeron Sol Tablet 15 mg po daily at bed time. TOC consult for home care.  Disposition: Based on my evaluation, patient does not meet criteria for psychiatric inpatient admission and may be discharge home when medically stable. New orders for Remeron, PT, and TOC consult. Supportive therapy provided about ongoing stressors. Discussed crisis plan, support from social network, calling 911, coming to the Emergency Department, and calling Suicide Hotline.  Past Psychiatric History: Anxiety  Risk to Self:  yes Risk to Others:  no Prior Inpatient Therapy:  no Prior Outpatient Therapy:  no  Past Medical History:  Past Medical History:  Diagnosis Date   Anemia    Anxiety    Depression    Diabetes mellitus without complication (HCC)    type 1   Diarrhea    as of 12/2014 - pt states that she's had diarrhea for a year   Fibromyalgia    Forgetfulness 10/2016   Frequent UTI    due to small urethra   GERD (gastroesophageal reflux disease)    Headache    hx of migraines   History of hiatal hernia    Lupus (HCC)    Memory difficulty 10/2016   Noncompliance    Pneumonia    Stevens-Johnson syndrome (HCC)    Thyroid disease    has had radioactive iodine treatment for hyperthyroidism in 1990   Vitamin D deficiency 07/07/2014    Past Surgical History:  Procedure Laterality Date   CHOLECYSTECTOMY     2 bifurcations of liver during lap chole   EXTERNAL FIXATION LEG Right 06/30/2014   Procedure: EXTERNAL FIXATION LEG;  Surgeon: Vickki Hearing, MD;  Location: [facility];  Service: Orthopedics;  Laterality: Right;  synthes large fragment exteranal fixation  c arm    EXTERNAL FIXATION LEG Right 07/07/2014   Procedure: REVISION EXTERNAL FIXATION LEG;  Surgeon: Myrene Galas, MD;  Location: [facility] OR;  Service: Orthopedics;  Laterality: Right;   EXTERNAL FIXATION REMOVAL Right 07/18/2014   Procedure: REMOVAL EXTERNAL FIXATION LEG;  Surgeon:  Myrene Galas, MD;  Location: [facility] OR;  Service: Orthopedics;  Laterality: Right;   HARDWARE REMOVAL Right 01/09/2015   Procedure: HARDWARE REMOVAL,RIGHT PROXIMAL TIBIAL;  Surgeon: Myrene Galas, MD;  Location: [facility] OR;  Service: Orthopedics;  Laterality: Right;   KNEE ARTHROSCOPY Right 01/09/2015   Procedure: ARTHROSCOPY KNEE RIGHT ;  Surgeon: Myrene Galas, MD;  Location: [facility] OR;  Service: Orthopedics;  Laterality: Right;   LIVER SURGERY     x4 due to bifurcations from lap chole   ORIF TIBIA PLATEAU Right 07/18/2014   Procedure: RIGHT OPEN REDUCTION INTERNAL FIXATION (ORIF) TIBIA FRACTURE;  Surgeon: Myrene Galas, MD;  Location: [facility] OR;  Service: Orthopedics;  Laterality: Right;  ORIF of bicondylar plateau fracture   STERIOD INJECTION Bilateral 01/09/2015   Procedure: RIGHT RING FINGER AND LEFT RING FINGER STEROID INJECTION;  Surgeon: Myrene Galas, MD;  Location[facility] OR;  Service: Orthopedics;  Laterality: Bilateral;   TONSILLECTOMY     Family History:  Family History  Problem Relation Age of Onset   Colon cancer Mother    Lung disease Father    Diabetes Father    Spina bifida Sister    Diabetes Brother  Family Psychiatric  History: no Social History:  Social History   Substance and Sexual Activity  Alcohol Use No     Social History   Substance and Sexual Activity  Drug Use No    Social History   Socioeconomic History   Marital status: Divorced    Spouse name: Not on file   Number of children: Not on file   Years of education: Not on file   Highest education level: Not on file  Occupational History   Not on file  Tobacco Use   Smoking status: Some Days    Packs/day: 0.50    Years: 30.00    Pack years: 15.00    Types: Cigarettes   Smokeless tobacco: Never  Substance and Sexual Activity   Alcohol use: No   Drug use: No   Sexual activity: Not on file  Other Topics Concern   Not on file  Social History Narrative   Not on file   Social Determinants of Health    Financial Resource Strain: Not on file  Food Insecurity: Not on file  Transportation Needs: Not on file  Physical Activity: Not on file  Stress: Not on file  Social Connections: Not on file   Additional Social History:    Allergies:   Allergies  Allergen Reactions   Penicillins Other (See Comments)    Did it involve swelling of the face/tongue/throat, SOB, or low BP? Yes Did it involve sudden or severe rash/hives, skin peeling, or any reaction on the inside of your mouth or nose? Yes Did you need to seek medical attention at a hospital or doctor's office? Yes When did it last happen?      61 years old If all above answers are "NO", may proceed with cephalosporin use. Tole86rated cephalexin in past.    Patient has SJS which prevents taking this medications   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    Labs:  Results for orders placed or performed during the hospital encounter of 08/19/21 (from the past 48 hour(s))  Urinalysis, Routine w reflex microscopic Urine, Clean Catch     Status: Abnormal   Collection Time: 08/19/21 10:14 AM  Result Value Ref Range   Color, Urine STRAW (A) YELLOW   APPearance CLEAR CLEAR   Specific Gravity, Urine 1.017 1.005 - 1.030   pH 9.0 (H) 5.0 - 8.0   Glucose, UA >=500 (A) NEGATIVE mg/dL   Hgb urine dipstick NEGATIVE NEGATIVE   Bilirubin Urine NEGATIVE NEGATIVE   Ketones, ur 5 (A) NEGATIVE mg/dL   Protein, ur NEGATIVE NEGATIVE mg/dL   Nitrite NEGATIVE NEGATIVE   Leukocytes,Ua TRACE (A) NEGATIVE   RBC / HPF 6-10 0 - 5 RBC/hpf   WBC, UA 0-5 0 - 5 WBC/hpf   Bacteria, UA RARE (A) NONE SEEN   Squamous Epithelial / LPF 0-5 0 - 5    Comment: Performed at [facility], [address] [place], [place] [address]  Urine Culture     Status: None (Preliminary result)   Collection Time: 08/19/21 10:14 AM   Specimen: Urine, Clean Catch  Result Value Ref Range   Specimen Description      URINE,  CLEAN CATCH Performed at [facility], [address] [place], [place] [address]    Special Requests      NONE Performed at [facility], [address], [place], [place] [address]    Culture      CULTURE REINCUBATED  FOR BETTER GROWTH Performed at [facility], [address], [place], [place] [address]    Report Status PENDING   Blood gas, arterial (at [facility])     Status: Abnormal   Collection Time: 08/19/21 10:15 AM  Result Value Ref Range   FIO2 28.00 %   pH, Arterial 7.55 (H) 7.35 - 7.45   pCO2 arterial 55 (H) 32 - 48 mmHg   pO2, Arterial 77 (L) 83 - 108 mmHg   Bicarbonate 48.1 (H) 20.0 - 28.0 mmol/L   Acid-Base Excess 22.1 (H) 0.0 - 2.0 mmol/L   O2 Saturation 96.7 %   Patient temperature 37.0    Collection site RIGHT RADIAL    Drawn by 0938    Allens test (pass/fail) PASS PASS    Comment: Performed at [facility], [address] [place], [place] [address]  Brain natriuretic peptide     Status: Abnormal   Collection Time: 08/19/21 10:35 AM  Result Value Ref Range   B Natriuretic Peptide 200.0 (H) 0.0 - 100.0 pg/mL    Comment: Performed at [facility], [address], [place], [place] [address]  Troponin I (High Sensitivity)     Status: None   Collection Time: 08/19/21 10:35 AM  Result Value Ref Range   Troponin I (High Sensitivity) 15 <18 ng/L    Comment: (NOTE) Elevated high sensitivity troponin I (hsTnI) values and significant  changes across serial measurements may suggest ACS but many other  chronic and acute conditions are known to elevate hsTnI results.  Refer to the Links section for chest pain algorithms and additional  guidance. Performed at [facility], [address] [place], [place] [address]   Magnesium     Status: None   Collection Time: 08/19/21 10:35 AM  Result Value Ref Range   Magnesium 2.2 1.7 - 2.4 mg/dL    Comment: Performed at [facility], [address] [place], [place] [address]  Comprehensive metabolic panel     Status: Abnormal    Collection Time: 08/19/21 10:35 AM  Result Value Ref Range   Sodium 138 135 - 145 mmol/L   Potassium 2.4 (LL) 3.5 - 5.1 mmol/L    Comment: CRITICAL RESULT CALLED TO, READ BACK BY AND VERIFIED WITH: Percival Spanish, RN @ 1155 ON 08/19/21 C VARNER    Chloride 89 (L) 98 - 111 mmol/L   CO2 41 (H) 22 - 32 mmol/L   Glucose, Bld 391 (H) 70 - 99 mg/dL    Comment: Glucose reference range applies only to samples taken after fasting for at least 8 hours.   BUN <5 (L) 6 - 20 mg/dL   Creatinine, Ser 5.10 (L) 0.44 - 1.00 mg/dL   Calcium 8.6 (L) 8.9 - 10.3 mg/dL   Total Protein 6.1 (L) 6.5 - 8.1 g/dL   Albumin 3.1 (L) 3.5 - 5.0 g/dL   AST 21 15 - 41 U/L   ALT 7 0 - 44 U/L   Alkaline Phosphatase 104 38 - 126 U/L   Total Bilirubin 0.6 0.3 - 1.2 mg/dL   GFR, Estimated >25 >85 mL/min    Comment: (NOTE) Calculated using the CKD-EPI Creatinine Equation (2021)    Anion gap 8 5 - 15    Comment: Performed at [facility], [address] [place], [place] [address]  CBC with Differential     Status: None   Collection Time: 08/19/21 10:35 AM  Result Value Ref Range   WBC 7.0 4.0 - 10.5 K/uL  RBC 4.65 3.87 - 5.11 MIL/uL   Hemoglobin 14.1 12.0 - 15.0 g/dL   HCT 41.9 37.9 - 02.4 %   MCV 89.2 80.0 - 100.0 fL   MCH 30.3 26.0 - 34.0 pg   MCHC 34.0 30.0 - 36.0 g/dL   RDW 09.7 35.3 - 29.9 %   Platelets 269 150 - 400 K/uL   nRBC 0.0 0.0 - 0.2 %   Neutrophils Relative % 61 %   Neutro Abs 4.3 1.7 - 7.7 K/uL   Lymphocytes Relative 26 %   Lymphs Abs 1.8 0.7 - 4.0 K/uL   Monocytes Relative 11 %   Monocytes Absolute 0.8 0.1 - 1.0 K/uL   Eosinophils Relative 1 %   Eosinophils Absolute 0.1 0.0 - 0.5 K/uL   Basophils Relative 1 %   Basophils Absolute 0.0 0.0 - 0.1 K/uL   Immature Granulocytes 0 %   Abs Immature Granulocytes 0.03 0.00 - 0.07 K/uL    Comment: Performed at [facility], [address], [place], [place] [address]  Lactic acid, plasma     Status: None   Collection Time: 08/19/21 10:35 AM  Result Value  Ref Range   Lactic Acid, Venous 1.2 0.5 - 1.9 mmol/L    Comment: Performed at [facility], [address] [place], [place] [address]  Hemoglobin A1c     Status: Abnormal   Collection Time: 08/19/21 10:35 AM  Result Value Ref Range   Hgb A1c MFr Bld 14.4 (H) 4.8 - 5.6 %    Comment: (NOTE) Pre diabetes:          5.7%-6.4%  Diabetes:              >6.4%  Glycemic control for   <7.0% adults with diabetes    Mean Plasma Glucose 366.58 mg/dL    Comment: Performed at [facility], [address] [place], [place] [address]  Resp Panel by RT-PCR (Flu A&B, Covid) Anterior Nasal Swab     Status: None   Collection Time: 08/19/21 10:37 AM   Specimen: Anterior Nasal Swab  Result Value Ref Range   SARS Coronavirus 2 by RT PCR NEGATIVE NEGATIVE    Comment: (NOTE) SARS-CoV-2 target nucleic acids are NOT DETECTED.  The SARS-CoV-2 RNA is generally detectable in upper respiratory specimens during the acute phase of infection. The lowest concentration of SARS-CoV-2 viral copies this assay can detect is 138 copies/mL. A negative result does not preclude SARS-Cov-2 infection and should not be used as the sole basis for treatment or other patient management decisions. A negative result may occur with  improper specimen collection/handling, submission of specimen other than nasopharyngeal swab, presence of viral mutation(s) within the areas targeted by this assay, and inadequate number of viral copies(<138 copies/mL). A negative result must be combined with clinical observations, patient history, and epidemiological information. The expected result is Negative.  Fact Sheet for Patients:  BloggerCourse.com  Fact Sheet for Healthcare Providers:  SeriousBroker.it  This test is no t yet approved or cleared by the [place] FDA and  has been authorized for detection and/or diagnosis of SARS-CoV-2 by FDA under an Emergency Use Authorization  (EUA). This EUA will remain  in effect (meaning this test can be used) for the duration of the COVID-19 declaration under Section 564(b)(1) of the Act, 21 U.S.C.section 360bbb-3(b)(1), unless the authorization is terminated  or revoked sooner.       Influenza A by PCR NEGATIVE NEGATIVE   Influenza B by PCR NEGATIVE NEGATIVE  Comment: (NOTE) The Xpert Xpress SARS-CoV-2/FLU/RSV plus assay is intended as an aid in the diagnosis of influenza from Nasopharyngeal swab specimens and should not be used as a sole basis for treatment. Nasal washings and aspirates are unacceptable for Xpert Xpress SARS-CoV-2/FLU/RSV testing.  Fact Sheet for Patients: BloggerCourse.com  Fact Sheet for Healthcare Providers: SeriousBroker.it  This test is not yet approved or cleared by the [place] FDA and has been authorized for detection and/or diagnosis of SARS-CoV-2 by FDA under an Emergency Use Authorization (EUA). This EUA will remain in effect (meaning this test can be used) for the duration of the COVID-19 declaration under Section 564(b)(1) of the Act, 21 U.S.C. section 360bbb-3(b)(1), unless the authorization is terminated or revoked.  Performed at [facility], [address] [place], [place] [address]   Protime-INR     Status: None   Collection Time: 08/19/21 10:49 AM  Result Value Ref Range   Prothrombin Time 12.2 11.4 - 15.2 seconds   INR 0.9 0.8 - 1.2    Comment: (NOTE) INR goal varies based on device and disease states. Performed at [facility], [address], [place], [place] [address]   APTT     Status: None   Collection Time: 08/19/21 10:49 AM  Result Value Ref Range   aPTT 24 24 - 36 seconds    Comment: Performed at [facility], [address], [place], [place] [address]  Blood Culture (routine x 2)     Status: None (Preliminary result)   Collection Time: 08/19/21 10:49 AM   Specimen: Right Antecubital; Blood  Result  Value Ref Range   Specimen Description RIGHT ANTECUBITAL    Special Requests      BOTTLES DRAWN AEROBIC AND ANAEROBIC Blood Culture adequate volume   Culture      NO GROWTH < 24 HOURS Performed at [facility], [address] [place], [place] [address]    Report Status PENDING   Blood Culture (routine x 2)     Status: None (Preliminary result)   Collection Time: 08/19/21 10:53 AM   Specimen: BLOOD RIGHT HAND  Result Value Ref Range   Specimen Description BLOOD RIGHT HAND    Special Requests      BOTTLES DRAWN AEROBIC AND ANAEROBIC Blood Culture adequate volume   Culture      NO GROWTH < 24 HOURS Performed at [facility], [address], [place], [place] [address]    Report Status PENDING   Glucose, capillary     Status: Abnormal   Collection Time: 08/19/21  4:12 PM  Result Value Ref Range   Glucose-Capillary 327 (H) 70 - 99 mg/dL    Comment: Glucose reference range applies only to samples taken after fasting for at least 8 hours.  Glucose, capillary     Status: Abnormal   Collection Time: 08/19/21  9:20 PM  Result Value Ref Range   Glucose-Capillary 221 (H) 70 - 99 mg/dL    Comment: Glucose reference range applies only to samples taken after fasting for at least 8 hours.  Glucose, capillary     Status: Abnormal   Collection Time: 08/20/21  3:31 AM  Result Value Ref Range   Glucose-Capillary 162 (H) 70 - 99 mg/dL    Comment: Glucose reference range applies only to samples taken after fasting for at least 8 hours.  Basic metabolic panel     Status: Abnormal   Collection Time: 08/20/21  4:45 AM  Result Value Ref Range   Sodium 139 135 -  145 mmol/L   Potassium 2.1 (LL) 3.5 - 5.1 mmol/L    Comment: CRITICAL RESULT CALLED TO, READ BACK BY AND VERIFIED WITH: HARDIN,S AT 6:35AM ON 08/20/21 BY FESTERMAN,C    Chloride 94 (L) 98 - 111 mmol/L   CO2 34 (H) 22 - 32 mmol/L   Glucose, Bld 184 (H) 70 - 99 mg/dL    Comment: Glucose reference range applies only to samples taken after  fasting for at least 8 hours.   BUN 7 6 - 20 mg/dL   Creatinine, Ser 1.61 (L) 0.44 - 1.00 mg/dL   Calcium 8.8 (L) 8.9 - 10.3 mg/dL   GFR, Estimated >09 >60 mL/min    Comment: (NOTE) Calculated using the CKD-EPI Creatinine Equation (2021)    Anion gap 11 5 - 15    Comment: Performed at [facility], [address] [place], [place] [address]  Magnesium     Status: None   Collection Time: 08/20/21  4:45 AM  Result Value Ref Range   Magnesium 1.9 1.7 - 2.4 mg/dL    Comment: Performed at [facility], [address] [place], [place] [address]  VITAMIN D 25 Hydroxy (Vit-D Deficiency, Fractures)     Status: None   Collection Time: 08/20/21  4:45 AM  Result Value Ref Range   Vit D, 25-Hydroxy 57.85 30 - 100 ng/mL    Comment: (NOTE) Vitamin D deficiency has been defined by the Institute of Medicine  and an Endocrine Society practice guideline as a level of serum 25-OH  vitamin D less than 20 ng/mL (1,2). The Endocrine Society went on to  further define vitamin D insufficiency as a level between 21 and 29  ng/mL (2).  1. IOM (Institute of Medicine). 2010. Dietary reference intakes for  calcium and D. [place]: [employer]. 2. Holick MF, Binkley Ferris, Bischoff-Ferrari HA, et al. Evaluation,  treatment, and prevention of vitamin D deficiency: an Endocrine  Society clinical practice guideline, JCEM. 2011 Jul; 96(7): 1911-30.  Performed at [facility], [address] [place], [place] [address]   Glucose, capillary     Status: Abnormal   Collection Time: 08/20/21  7:06 AM  Result Value Ref Range   Glucose-Capillary 182 (H) 70 - 99 mg/dL    Comment: Glucose reference range applies only to samples taken after fasting for at least 8 hours.  Glucose, capillary     Status: Abnormal   Collection Time: 08/20/21 11:44 AM  Result Value Ref Range   Glucose-Capillary 147 (H) 70 - 99 mg/dL    Comment: Glucose reference range applies only to samples taken after fasting for  at least 8 hours.    Medications:  Current Facility-Administered Medications  Medication Dose Route Frequency Provider Last Rate Last Admin   acetaminophen (TYLENOL) tablet 650 mg  650 mg Oral Q6H PRN Johnson, Clanford L, MD       Or   acetaminophen (TYLENOL) suppository 650 mg  650 mg Rectal Q6H PRN Johnson, Clanford L, MD       bisacodyl (DULCOLAX) EC tablet 5 mg  5 mg Oral Daily PRN Johnson, Clanford L, MD       cholecalciferol (VITAMIN D3) tablet 1,000 Units  1,000 Units Oral Weekly Johnson, Clanford L, MD   1,000 Units at 08/20/21 0833   enoxaparin (LOVENOX) injection 30 mg  30 mg Subcutaneous Q24H Johnson, Clanford L, MD   30 mg at 08/19/21 2127   fentaNYL (SUBLIMAZE) injection 12.5 mcg  12.5 mcg Intravenous  Q2H PRN Laural Benes, Clanford L, MD   12.5 mcg at 08/20/21 0947   gabapentin (NEURONTIN) capsule 400 mg  400 mg Oral TID Standley Dakins L, MD   400 mg at 08/20/21 0832   guaiFENesin (MUCINEX) 12 hr tablet 600 mg  600 mg Oral BID Laural Benes, Clanford L, MD   600 mg at 08/20/21 1610   HYDROcodone-acetaminophen (NORCO/VICODIN) 5-325 MG per tablet 1 tablet  1 tablet Oral Q6H PRN Standley Dakins L, MD   1 tablet at 08/20/21 0838   insulin aspart (novoLOG) injection 0-5 Units  0-5 Units Subcutaneous QHS Laural Benes, Clanford L, MD   2 Units at 08/19/21 2136   insulin aspart (novoLOG) injection 0-9 Units  0-9 Units Subcutaneous TID WC Johnson, Clanford L, MD   1 Units at 08/20/21 1303   insulin aspart (novoLOG) injection 6 Units  6 Units Subcutaneous TID WC Johnson, Clanford L, MD   6 Units at 08/20/21 1303   insulin glargine-yfgn (SEMGLEE) injection 16 Units  16 Units Subcutaneous Daily Johnson, Clanford L, MD   16 Units at 08/20/21 0948   ipratropium-albuterol (DUONEB) 0.5-2.5 (3) MG/3ML nebulizer solution 3 mL  3 mL Nebulization Q6H Johnson, Clanford L, MD   3 mL at 08/20/21 1328   levofloxacin (LEVAQUIN) IVPB 500 mg  500 mg Intravenous Q24H Johnson, Clanford L, MD 100 mL/hr at 08/20/21 1318 500  mg at 08/20/21 1318   levothyroxine (SYNTHROID) tablet 150 mcg  150 mcg Oral Q0600 Laural Benes, Clanford L, MD   150 mcg at 08/20/21 0526   LORazepam (ATIVAN) tablet 0.5 mg  0.5 mg Oral Q8H PRN Laural Benes, Clanford L, MD   0.5 mg at 08/20/21 9604   MEDLINE mouth rinse  15 mL Mouth Rinse BID Johnson, Clanford L, MD   15 mL at 08/20/21 5409   nicotine (NICODERM CQ - dosed in mg/24 hours) patch 21 mg  21 mg Transdermal Daily PRN Johnson, Clanford L, MD       ondansetron (ZOFRAN) tablet 4 mg  4 mg Oral Q6H PRN Johnson, Clanford L, MD       Or   ondansetron (ZOFRAN) injection 4 mg  4 mg Intravenous Q6H PRN Johnson, Clanford L, MD       pantoprazole (PROTONIX) EC tablet 40 mg  40 mg Oral QPM Johnson, Clanford L, MD   40 mg at 08/19/21 1711   rOPINIRole (REQUIP) tablet 1 mg  1 mg Oral QHS Johnson, Clanford L, MD       traZODone (DESYREL) tablet 25 mg  25 mg Oral QHS PRN Cleora Fleet, MD        Musculoskeletal: Strength & Muscle Tone: within normal limits Gait & Station: normal Patient leans: N/A  Psychiatric Specialty Exam:  Presentation  General Appearance: Appropriate for Environment; Casual; Fairly Groomed  Eye Contact:Good  Speech:Clear and Coherent; Normal Rate  Speech Volume:Normal  Handedness:Right  Mood and Affect  Mood:Anxious; Depressed; Hopeless; Worthless  Affect:Depressed; Probation officer Processes:Coherent; Linear  Descriptions of Associations:Intact  Orientation:Full (Time, Place and Person)  Thought Content:Logical; WDL  History of Schizophrenia/Schizoaffective disorder:No data recorded Duration of Psychotic Symptoms:No data recorded Hallucinations:Hallucinations: None  Ideas of Reference:None  Suicidal Thoughts:Suicidal Thoughts: No  Homicidal Thoughts:Homicidal Thoughts: No  Sensorium  Memory:Immediate Fair; Recent Fair; Remote Fair  Judgment:Fair  Insight:Fair  Executive Functions  Concentration:Fair  Attention  Span:Fair  Recall:Fair  Fund of Knowledge:Fair  Language:Good  Psychomotor Activity  Psychomotor Activity:Psychomotor Activity: Normal  Assets  Assets:Communication Skills; Physical Health;  Social Support  Sleep  Sleep:Sleep: Fair Number of Hours of Sleep: 4  Physical Exam: Physical Exam Vitals and nursing note reviewed.  Constitutional:      Appearance: She is well-developed.  HENT:     Head: Normocephalic and atraumatic.  Eyes:     Extraocular Movements: Extraocular movements intact.     Pupils: Pupils are equal, round, and reactive to light.  Cardiovascular:     Rate and Rhythm: Normal rate.     Pulses: Normal pulses.  Pulmonary:     Effort: Pulmonary effort is normal.  Abdominal:     Palpations: Abdomen is soft.  Musculoskeletal:        General: Normal range of motion.     Cervical back: Normal range of motion and neck supple.  Skin:    General: Skin is warm.  Neurological:     General: No focal deficit present.     Mental Status: She is alert and oriented to person, place, and time.  Psychiatric:        Behavior: Behavior normal.   Review of Systems  Constitutional: Negative.  Negative for chills and fever.  HENT: Negative.  Negative for tinnitus.   Eyes: Negative.  Negative for blurred vision and double vision.  Respiratory: Negative.  Negative for cough, sputum production, shortness of breath and wheezing.   Cardiovascular: Negative.  Negative for chest pain and palpitations.  Gastrointestinal:  Negative for abdominal pain, constipation, diarrhea, heartburn, nausea and vomiting.  Genitourinary: Negative.  Negative for dysuria, frequency and urgency.  Musculoskeletal:  Positive for falls (Patient reported frequent falls.). Negative for back pain, joint pain, myalgias and neck pain.  Skin: Negative.  Negative for itching and rash.  Neurological:  Positive for weakness (Reported weakness). Negative for dizziness, tingling, tremors, sensory change, speech  change, focal weakness, seizures, loss of consciousness and headaches.  Endo/Heme/Allergies: Negative.  Negative for environmental allergies and polydipsia. Does not bruise/bleed easily.         Penicillins Penicillins  Other (See Comments) High Contraindication 09/20/2013 Did it involve swelling of the face/tongue/throat, SOB, or low BP? Yes Did it involve sudden or severe rash/hives, skin peeling, or any reaction on the inside of your mouth or nose? Yes Did you need to seek medical attention at a hospital or doctor's office? Yes When did it last happen?      61 years old If all above answers are "NO", may proceed with cephalosporin use. Tolerated cephalexin in past. 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 The patient is nervous/anxious and has insomnia.   Blood pressure 118/63, pulse 81, temperature 98.6 F (37 C), temperature source Oral, resp. rate 18, height  (1.499 m), weight 40.7 kg, SpO2 97 %. Body mass index is 18.14 kg/m.  Treatment Plan Summary: Daily contact with patient to assess and evaluate symptoms and progress in treatment and Medication management  Disposition: Patient does not meet criteria for psychiatric inpatient admission. Supportive therapy provided about ongoing stressors. Discussed crisis plan, support from social network, calling 911, coming to the Emergency Department, and calling Suicide Hotline.  This service was provided via telemedicine using a 2-way, interactive audio and video technology.  Names of all persons participating in this telemedicine service and their role in this encounter.  Names of all persons participating in this telemedicine service  and their role in this encounter. Name: Autumn Daniels Role: Patient  Name: Alan Mulder Role: Provider  Name: Dr. Lucianne Muss Role: Medical Director  Name: Dr. Laural Benes Role: APEDP    Cecilie Lowers, FNP 08/20/2021 2:39 PM
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
## 2021-08-20 NOTE — Progress Notes (Signed)
Pt asking multiple staff members for snacks and things to eat. Pt provided a tv dinner by respiratory therapist along with a bag lunch including [place] sandwich and baked chips from dietician. This nurse checked pts blood sugar considering she was appearing to be hungry. CBG 51, 802 of juice given. MD notified

## 2021-08-20 NOTE — Progress Notes (Signed)
PROGRESS NOTE   Autumn Daniels  OXB:[id number] DOB: 1960/10/29 DOA: 08/19/2021 PCP: April Manson, NP   Chief Complaint  Patient presents with   Shortness of Breath   Diarrhea   Level of care: Telemetry  Brief Admission History:  61 y/o female with multiple serious medical comorbidities including type 1 diabetes mellitus, chronic depression, GERD, systemic lupus, frequent UTIs, fibromyalgia, chronic tobacco abuse, hypokalemia, intermittent bouts of chronic diarrhea, long history of poor compliance with taking medicines, following up with physicians and following recommendations.  She continues to smoke cigarettes despite being counseled at length about the dangers of smoking and diabetes mellitus.  Patient has reported unstable lupus because she is refusing to follow-up with rheumatology.  She also has had multiple referrals and missed appointments with endocrinology but continues to no-show for appointments.  She rarely takes her basal insulin and her A1c remains greater than 10 up to greater than 12.  She has complications associated with long-term poorly controlled diabetes mellitus including diabetic neuropathy, nephropathy, retinopathy.  Patient is severely emaciated likely due to not taking enough insulin and has vitamin D deficiency, forgetfulness, multiple bouts of pneumonia, and recurrent hospitalizations.  Patient presented to emergency department by EMS complaining of shortness of breath weakness and diarrhea x1 week.  She also complained of right wrist pain and swelling 7 out of 10.  She was noted to have a pulse ox of 88% on room air.  She is not on home oxygen.  She reports that most of the night she felt unwell and was short of breath more than her baseline.  She reports nonproductive cough no fever or chills.  She also reports ankle edema.  She lives by herself.  She was last hospitalized in December 2022.  At that time she had severe hypokalemia and severe weakness and was unable to  get out of bed at that time.  Pt also has a history of leaving hospital against medical advice.    Today she is noted to be hypoxemic.  She had a PE O2 of 77 with a PCO2 of 55.  She was hypokalemic with a potassium of 2.4 and a glucose of 391.  She had a mildly elevated BNP at 200.0.  Her high-sensitivity troponin was 15.  Her lactic acid was 1.2.  She had a normal CBC.  Her SARS 2 coronavirus test and influenza testing was negative.  There was a retrocardiac opacity noted on chest x-ray consistent with pneumonia and patient was noted to have had a CT chest in 2021 that demonstrated a pulmonary nodule in that area and the radiologist is recommended that patient have a repeat CT chest done after completion of antibiotics.  The patient is being admitted into the hospital for further management.  08/20/2021 : pt has been obstinate at times and threatening to leave AMA, however, after talking with her further she is agreeable to consider ALF placement.  Also requested TTS consult for capacity evaluation.     Assessment and Plan: * Anxiety with depression -- behavioral health evaluation requested with TTS/psych NP  COPD (chronic obstructive pulmonary disease) (HCC) -- notes long history of tobacco use with physical exam evidence of tar-stained fingers and clubbing of fingers -- bronchodilators ordered and mucinex ordered for symptoms  Noncompliance with medication regimen -- unfortunately this patient is at HIGH RISK for acute and chronic complications of poorly controlled diabetes mellitus in addition to her other poorly controlled comorbidities -- she has been counseled by admitting physician and  documented that her PCP has also counseled with her and tried to work and assist her on multiple occasions.  --psych/TTS consult requested for capacity evaluation --SLP evaluation for cognition and MMSE exam   Failure to thrive in adult -- pt presents in a severely emaciated state -- pt is at high risk for  adverse outcome in this current condition -- [facility] protection services may need to get involved due to extreme self harm and neglect  Acute respiratory failure with hypoxemia (HCC) -- Patient presenting with a new oxygen requirement and hypoxemia on admission PCO2 of 55. -- She also has findings of pneumonia on chest x-ray being treated with IV antibiotic therapy. -- Patient also has underlying COPD given long history of tobacco abuse.   CAP (community acquired pneumonia) -- continue levofloxacin as ordered -- continue supportive measures   Hypoglycemia due to insulin -- reduced lantus to 12 units and reduced mealtime novolog to 4 units -- DC HS sliding scale coverage, continue CBG 5 times per day  Hyperglycemia -- secondary to poor compliance with taking insulin.  This has been well documented by her PCP the multiple attempts made to assist patient with getting medications, multiple referrals to endocrinology and no show appointments -- See insulin orders: given her extreme low weight we have ordered weight based insulin dosing and will titrate based on response.   Protein-calorie malnutrition, severe -- Pt not gaining weight due to absolute insulin deficiency -- we are trying to get her back on a regular insulin treatment program -- consult to dietitian for assistance -- repleting electrolytes and will follow Mg, Phos closely -- Pt is HIGH RISK for refeeding syndrome and we will monitor electrolytes daily and replace them aggressively as needed.    Hypokalemia -- she is clearly severely total body depleted of potassium for many reasons including diarrhea, hyperglycemia, ketoacidosis, etc.  -- IV and oral potassium ordered -- follow up magnesium and replace if needed -- anticipating need for more potassium replacement needs when hyperglycemia is starting to correct and potassium enters the cells  -- for now, monitoring BMP and Mg closely   Lupus (HCC) -- PCP  reported that SLE has been unstable as patient refuses to follow up with rheumatologist   GERD (gastroesophageal reflux disease) -- protonix ordered for GI protection   Type 1.5 diabetes mellitus with other specified complication (HCC) -- long history of poorly controlled disease as evidenced by an A1c>10%.   -- PCP reports that patient usually misses many doses of basal insulin and had multiple excuses why she doesn't take regularly and benefits check was done last admission and basal lantus insulin would be cost of $35 or less for patient.  -- we have restarted on basal bolus insulin weight based and will monitor CBG closely and make adjustments as needed to try to improve glycemic control.   Hypothyroidism following radioiodine therapy -- long history of poor compliance with taking thyroid supplement -- did not check TSH at this time as we know her TSH remains elevated due to poor compliance and would not want to adjust dose during this acute visit  -- pt should follow up with her PCP and endocrinologist for close monitoring.     Vitamin D deficiency -- check 25-OH vitamin D: 57.85 no need for replacement   DVT prophylaxis: enoxaparin Code Status: Full  Family Communication:  Disposition: Status is: Inpatient Remains inpatient appropriate because: IV antibiotics    Consultants:  Dietitian TTS/psych NP  Procedures:   Antimicrobials:  Levofloxacin IV 5/29>>  Subjective: Pt upset about breakfast tray and threatening to leave AMA.  Objective: Vitals:   08/20/21 0429 08/20/21 0749 08/20/21 1317 08/20/21 1328  BP: 133/79  118/63   Pulse: 77  81   Resp: 18  18   Temp: 98.3 F (36.8 C)  98.6 F (37 C)   TempSrc: Oral  Oral   SpO2: 95% 96% 99% 97%  Weight:      Height:        Intake/Output Summary (Last 24 hours) at 08/20/2021 1621 Last data filed at 08/20/2021 1300 Gross per 24 hour  Intake 1637.45 ml  Output 350 ml  Net 1287.45 ml   Filed Weights   08/19/21 1012  08/19/21 1515  Weight: 40.8 kg 40.7 kg   Examination:  General exam: frail emaciated poorly kept and groomed, patient is confrontational today and angry.  Respiratory system: rales heard RLL. Respiratory effort normal. Cardiovascular system: normal S1 & S2 heard. No JVD, murmurs, rubs, gallops or clicks. No pedal edema. Gastrointestinal system: Abdomen is nondistended, soft and nontender. No organomegaly or masses felt. Normal bowel sounds heard. Central nervous system: Alert and oriented. No focal neurological deficits. Extremities: Symmetric 5 x 5 power. Skin: No rashes, lesions or ulcers. Psychiatry: Judgement and insight appear POOR. Mood & affect angry.   Data Reviewed: I have personally reviewed following labs and imaging studies  CBC: Recent Labs  Lab 08/19/21 1035  WBC 7.0  NEUTROABS 4.3  HGB 14.1  HCT 41.5  MCV 89.2  PLT 269    Basic Metabolic Panel: Recent Labs  Lab 08/19/21 1035 08/20/21 0445  NA 138 139  K 2.4* 2.1*  CL 89* 94*  CO2 41* 34*  GLUCOSE 391* 184*  BUN <5* 7  CREATININE 0.41* 0.36*  CALCIUM 8.6* 8.8*  MG 2.2 1.9    CBG: Recent Labs  Lab 08/19/21 2120 08/20/21 0331 08/20/21 0706 08/20/21 1144 08/20/21 1529  GLUCAP 221* 162* 182* 147* 51*    Recent Results (from the past 240 hour(s))  Urine Culture     Status: Abnormal   Collection Time: 08/19/21 10:14 AM   Specimen: Urine, Clean Catch  Result Value Ref Range Status   Specimen Description   Final    URINE, CLEAN CATCH Performed at [facility], [address], [place], [place] [address]    Special Requests   Final    NONE Performed at [facility], [address] [place], [place] [address]    Culture (A)  Final    50,000 COLONIES/mL LACTOBACILLUS SPECIES Standardized susceptibility testing for this organism is not available. Performed at [facility], [address], [place], [place] [address]    Report Status 08/20/2021 FINAL  Final  Resp Panel by RT-PCR (Flu A&B,  Covid) Anterior Nasal Swab     Status: None   Collection Time: 08/19/21 10:37 AM   Specimen: Anterior Nasal Swab  Result Value Ref Range Status   SARS Coronavirus 2 by RT PCR NEGATIVE NEGATIVE Final    Comment: (NOTE) SARS-CoV-2 target nucleic acids are NOT DETECTED.  The SARS-CoV-2 RNA is generally detectable in upper respiratory specimens during the acute phase of infection. The lowest concentration of SARS-CoV-2 viral copies this assay can detect is 138 copies/mL. A negative result does not preclude SARS-Cov-2 infection and should not be used as the sole basis for treatment or other patient management decisions. A negative result may occur with  improper specimen collection/handling, submission  of specimen other than nasopharyngeal swab, presence of viral mutation(s) within the areas targeted by this assay, and inadequate number of viral copies(<138 copies/mL). A negative result must be combined with clinical observations, patient history, and epidemiological information. The expected result is Negative.  Fact Sheet for Patients:  BloggerCourse.com  Fact Sheet for Healthcare Providers:  SeriousBroker.it  This test is no t yet approved or cleared by the [place] FDA and  has been authorized for detection and/or diagnosis of SARS-CoV-2 by FDA under an Emergency Use Authorization (EUA). This EUA will remain  in effect (meaning this test can be used) for the duration of the COVID-19 declaration under Section 564(b)(1) of the Act, 21 U.S.C.section 360bbb-3(b)(1), unless the authorization is terminated  or revoked sooner.       Influenza A by PCR NEGATIVE NEGATIVE Final   Influenza B by PCR NEGATIVE NEGATIVE Final    Comment: (NOTE) The Xpert Xpress SARS-CoV-2/FLU/RSV plus assay is intended as an aid in the diagnosis of influenza from Nasopharyngeal swab specimens and should not be used as a sole basis for treatment. Nasal  washings and aspirates are unacceptable for Xpert Xpress SARS-CoV-2/FLU/RSV testing.  Fact Sheet for Patients: BloggerCourse.com  Fact Sheet for Healthcare Providers: SeriousBroker.it  This test is not yet approved or cleared by the [place] FDA and has been authorized for detection and/or diagnosis of SARS-CoV-2 by FDA under an Emergency Use Authorization (EUA). This EUA will remain in effect (meaning this test can be used) for the duration of the COVID-19 declaration under Section 564(b)(1) of the Act, 21 U.S.C. section 360bbb-3(b)(1), unless the authorization is terminated or revoked.  Performed at [facility], [address] [place], [place] [address]   Blood Culture (routine x 2)     Status: None (Preliminary result)   Collection Time: 08/19/21 10:49 AM   Specimen: Right Antecubital; Blood  Result Value Ref Range Status   Specimen Description RIGHT ANTECUBITAL  Final   Special Requests   Final    BOTTLES DRAWN AEROBIC AND ANAEROBIC Blood Culture adequate volume   Culture   Final    NO GROWTH < 24 HOURS Performed at [facility], [address] [place], [place] [address]    Report Status PENDING  Incomplete  Blood Culture (routine x 2)     Status: None (Preliminary result)   Collection Time: 08/19/21 10:53 AM   Specimen: BLOOD RIGHT HAND  Result Value Ref Range Status   Specimen Description BLOOD RIGHT HAND  Final   Special Requests   Final    BOTTLES DRAWN AEROBIC AND ANAEROBIC Blood Culture adequate volume   Culture   Final    NO GROWTH < 24 HOURS Performed at [facility], [address], [place], [place] [address]    Report Status PENDING  Incomplete     Radiology Studies: DG Chest Port 1 View  Result Date: 08/19/2021 CLINICAL DATA:  Cough and shortness of breath EXAM: PORTABLE CHEST 1 VIEW COMPARISON:  02/11/2021 FINDINGS: Retrocardiac airspace opacity. Artifact from EKG leads. Background interstitial  coarsening which is generalized. No visible effusion or pneumothorax. Normal heart size and mediastinal contours. IMPRESSION: Retrocardiac opacity, history suggesting pneumonia. There was a pulmonary nodule in this area on 2021 chest CT, recommend follow-up chest CT after completed antibiotics. Electronically Signed   By: Tiburcio Pea M.D.   On: 08/19/2021 10:50    Scheduled Meds:  cholecalciferol  1,000 Units Oral Weekly   enoxaparin (LOVENOX) injection  30 mg Subcutaneous Q24H  gabapentin  400 mg Oral TID   guaiFENesin  600 mg Oral BID   insulin aspart  0-9 Units Subcutaneous TID WC   insulin aspart  4 Units Subcutaneous TID WC   [START ON 08/21/2021] insulin glargine-yfgn  12 Units Subcutaneous Daily   ipratropium-albuterol  3 mL Nebulization Q6H   levothyroxine  150 mcg Oral Q0600   mouth rinse  15 mL Mouth Rinse BID   pantoprazole  40 mg Oral QPM   rOPINIRole  1 mg Oral QHS   tuberculin  5 Units Intradermal Once   Continuous Infusions:  levofloxacin (LEVAQUIN) IV 500 mg (08/20/21 1318)     LOS: 1 day   Time spent: 40 mins  Renn Stille Laural Benes, MD How to contact the [facility] Attending or Consulting provider 7A - 7P or covering provider during after hours 7P -7A, for this patient?  Check the care team in [facility] and look for a) attending/consulting TRH provider listed and b) the [facility] team listed Log into [email or web address] and use [facility] universal password to access. If you do not have the password, please contact the hospital operator. Locate the [facility] provider you are looking for under [facility] and page to a number that you can be directly reached. If you still have difficulty reaching the provider, please page the [facility] (Director on Call) for the Hospitalists listed on amion for assistance.  08/20/2021, 4:21 PM

## 2021-08-20 NOTE — [place] FL2 (Signed)
[place] MEDICAID FL2 LEVEL OF CARE SCREENING TOOL     IDENTIFICATION  Patient Name: Autumn Daniels Birthdate: Jun 28, 1960 Sex: female Admission Date (Current Location): 08/19/2021  [facility] and [place] Number:  [employer] and Address:  [facility],  [address], [place] [address]      Provider Number: [id number]  Attending Physician Name and Address:  Cleora Fleet, MD  Relative Name and Phone Number:       Current Level of Care: Hospital Recommended Level of Care: Assisted Living Facility Prior Approval Number:    Date Approved/Denied:   PASRR Number:    Discharge Plan: Other (Comment) (ALF)    Current Diagnoses: Patient Active Problem List   Diagnosis Date Noted   Anxiety with depression 08/20/2021   CAP (community acquired pneumonia) 08/19/2021   Acute respiratory failure with hypoxemia (HCC) 08/19/2021   Hyperglycemia 08/19/2021   Failure to thrive in adult 08/19/2021   Noncompliance with medication regimen 08/19/2021   COPD (chronic obstructive pulmonary disease) (HCC) 08/19/2021   Protein-calorie malnutrition, severe 03/21/2021   Hypokalemia 02/11/2021   Acute blood loss anemia 07/20/2014   Vitamin D deficiency 07/07/2014   Hypothyroidism following radioiodine therapy    Type 1 diabetes mellitus with other specified complication (HCC)    GERD (gastroesophageal reflux disease)    Lupus (HCC)    Tibial plateau fracture 06/29/2014   Closed fracture of thoracic vertebral body (HCC) 09/27/2013    Orientation RESPIRATION BLADDER Height & Weight     Self, Time, Situation, Place  Normal Continent Weight: 89 lb 12.8 oz (40.7 kg) Height:  4\' 11"  (149.9 cm)  BEHAVIORAL SYMPTOMS/MOOD NEUROLOGICAL BOWEL NUTRITION STATUS      Continent Diet (diabetic, no concentrated sweets)  AMBULATORY STATUS COMMUNICATION OF NEEDS Skin   Independent Verbally                         Personal Care Assistance Level of Assistance  Bathing,  Feeding, Dressing Bathing Assistance: Independent Feeding assistance: Independent Dressing Assistance: Independent     Functional Limitations Info  Sight, Hearing, Speech Sight Info: Adequate Hearing Info: Adequate Speech Info: Adequate    SPECIAL CARE FACTORS FREQUENCY                       Contractures Contractures Info: Not present    Additional Factors Info  Code Status, Allergies, Insulin Sliding Scale Code Status Info: Full Allergies Info: Penicillins, Sulfa antibiotics           Current Medications (08/20/2021):  This is the current hospital active medication list Current Facility-Administered Medications  Medication Dose Route Frequency Provider Last Rate Last Admin   acetaminophen (TYLENOL) tablet 650 mg  650 mg Oral Q6H PRN Johnson, Clanford L, MD       Or   acetaminophen (TYLENOL) suppository 650 mg  650 mg Rectal Q6H PRN Johnson, Clanford L, MD       bisacodyl (DULCOLAX) EC tablet 5 mg  5 mg Oral Daily PRN Johnson, Clanford L, MD       cholecalciferol (VITAMIN D3) tablet 1,000 Units  1,000 Units Oral Weekly Johnson, Clanford L, MD   1,000 Units at 08/20/21 0833   enoxaparin (LOVENOX) injection 30 mg  30 mg Subcutaneous Q24H Johnson, Clanford L, MD   30 mg at 08/19/21 2127   fentaNYL (SUBLIMAZE) injection 12.5 mcg  12.5 mcg Intravenous Q2H PRN Johnson, Clanford L, MD   12.5 mcg  at 08/20/21 0947   gabapentin (NEURONTIN) capsule 400 mg  400 mg Oral TID Standley Dakins L, MD   400 mg at 08/20/21 5732   guaiFENesin (MUCINEX) 12 hr tablet 600 mg  600 mg Oral BID Laural Benes, Clanford L, MD   600 mg at 08/20/21 2025   HYDROcodone-acetaminophen (NORCO/VICODIN) 5-325 MG per tablet 1 tablet  1 tablet Oral Q6H PRN Standley Dakins L, MD   1 tablet at 08/20/21 0838   insulin aspart (novoLOG) injection 0-5 Units  0-5 Units Subcutaneous QHS Laural Benes, Clanford L, MD   2 Units at 08/19/21 2136   insulin aspart (novoLOG) injection 0-9 Units  0-9 Units Subcutaneous TID WC  Johnson, Clanford L, MD   1 Units at 08/20/21 1303   insulin aspart (novoLOG) injection 6 Units  6 Units Subcutaneous TID WC Johnson, Clanford L, MD   6 Units at 08/20/21 1303   insulin glargine-yfgn (SEMGLEE) injection 16 Units  16 Units Subcutaneous Daily Johnson, Clanford L, MD   16 Units at 08/20/21 0948   ipratropium-albuterol (DUONEB) 0.5-2.5 (3) MG/3ML nebulizer solution 3 mL  3 mL Nebulization Q6H Johnson, Clanford L, MD   3 mL at 08/20/21 1328   levofloxacin (LEVAQUIN) IVPB 500 mg  500 mg Intravenous Q24H Johnson, Clanford L, MD 100 mL/hr at 08/20/21 1318 500 mg at 08/20/21 1318   levothyroxine (SYNTHROID) tablet 150 mcg  150 mcg Oral Q0600 Laural Benes, Clanford L, MD   150 mcg at 08/20/21 0526   LORazepam (ATIVAN) tablet 0.5 mg  0.5 mg Oral Q8H PRN Laural Benes, Clanford L, MD   0.5 mg at 08/20/21 4270   MEDLINE mouth rinse  15 mL Mouth Rinse BID Johnson, Clanford L, MD   15 mL at 08/20/21 6237   nicotine (NICODERM CQ - dosed in mg/24 hours) patch 21 mg  21 mg Transdermal Daily PRN Johnson, Clanford L, MD       ondansetron (ZOFRAN) tablet 4 mg  4 mg Oral Q6H PRN Johnson, Clanford L, MD       Or   ondansetron (ZOFRAN) injection 4 mg  4 mg Intravenous Q6H PRN Johnson, Clanford L, MD       pantoprazole (PROTONIX) EC tablet 40 mg  40 mg Oral QPM Johnson, Clanford L, MD   40 mg at 08/19/21 1711   rOPINIRole (REQUIP) tablet 1 mg  1 mg Oral QHS Johnson, Clanford L, MD       traZODone (DESYREL) tablet 25 mg  25 mg Oral QHS PRN Johnson, Clanford L, MD       tuberculin injection 5 Units  5 Units Intradermal Once Cleora Fleet, MD         Discharge Medications: Please see discharge summary for a list of discharge medications.  Relevant Imaging Results:  Relevant Lab Results:   Additional Information    Elliot Gault, LCSW

## 2021-08-21 ENCOUNTER — Encounter (HOSPITAL_COMMUNITY): Payer: Self-pay | Admitting: Family Medicine

## 2021-08-21 ENCOUNTER — Inpatient Hospital Stay (HOSPITAL_COMMUNITY): Payer: Medicare PPO

## 2021-08-21 DIAGNOSIS — F418 Other specified anxiety disorders: Secondary | ICD-10-CM | POA: Diagnosis not present

## 2021-08-21 DIAGNOSIS — I611 Nontraumatic intracerebral hemorrhage in hemisphere, cortical: Secondary | ICD-10-CM

## 2021-08-21 LAB — CBC
HCT: 37.9 % (ref 36.0–46.0)
Hemoglobin: 12.2 g/dL (ref 12.0–15.0)
MCH: 29.5 pg (ref 26.0–34.0)
MCHC: 32.2 g/dL (ref 30.0–36.0)
MCV: 91.8 fL (ref 80.0–100.0)
Platelets: 217 10*3/uL (ref 150–400)
RBC: 4.13 MIL/uL (ref 3.87–5.11)
RDW: 15.4 % (ref 11.5–15.5)
WBC: 13.4 10*3/uL — ABNORMAL HIGH (ref 4.0–10.5)
nRBC: 0 % (ref 0.0–0.2)

## 2021-08-21 LAB — GLUCOSE, CAPILLARY
Glucose-Capillary: 179 mg/dL — ABNORMAL HIGH (ref 70–99)
Glucose-Capillary: 232 mg/dL — ABNORMAL HIGH (ref 70–99)
Glucose-Capillary: 128 mg/dL — ABNORMAL HIGH (ref 70–99)

## 2021-08-21 LAB — BLOOD GAS, VENOUS
Acid-Base Excess: 13.7 mmol/L — ABNORMAL HIGH (ref 0.0–2.0)
Bicarbonate: 36.3 mmol/L — ABNORMAL HIGH (ref 20.0–28.0)
Drawn by: 6509
FIO2: 21 %
O2 Saturation: 94.5 %
Patient temperature: 36.4
pCO2, Ven: 36 mmHg — ABNORMAL LOW (ref 44–60)
pH, Ven: 7.61 (ref 7.25–7.43)
pO2, Ven: 61 mmHg — ABNORMAL HIGH (ref 32–45)

## 2021-08-21 LAB — BASIC METABOLIC PANEL
Anion gap: 8 (ref 5–15)
BUN: 9 mg/dL (ref 6–20)
CO2: 35 mmol/L — ABNORMAL HIGH (ref 22–32)
Calcium: 9 mg/dL (ref 8.9–10.3)
Chloride: 96 mmol/L — ABNORMAL LOW (ref 98–111)
Creatinine, Ser: 0.38 mg/dL — ABNORMAL LOW (ref 0.44–1.00)
GFR, Estimated: >60 mL/min (ref >60)
Glucose, Bld: 208 mg/dL — ABNORMAL HIGH (ref 70–99)
Potassium: 2.1 mmol/L — CL (ref 3.5–5.1)
Sodium: 139 mmol/L (ref 135–145)

## 2021-08-21 LAB — PHOSPHORUS: Phosphorus: 2.9 mg/dL (ref 2.5–4.6)

## 2021-08-21 LAB — PROTIME-INR
INR: 0.9 (ref 0.8–1.2)
Prothrombin Time: 12.2 seconds (ref 11.4–15.2)

## 2021-08-21 LAB — MRSA NEXT GEN BY PCR, NASAL: MRSA by PCR Next Gen: NOT DETECTED

## 2021-08-21 LAB — MAGNESIUM: Magnesium: 2 mg/dL (ref 1.7–2.4)

## 2021-08-21 MED ORDER — LORAZEPAM 2 MG/ML IJ SOLN
1.0000 mg | INTRAMUSCULAR | Status: DC | PRN
  Administered 2021-08-21 (×2): 1 mg via INTRAVENOUS
  Filled 2021-08-21 (×2): qty 1

## 2021-08-21 MED ORDER — EMPTY CONTAINERS FLEXIBLE MISC
50.0000 g | Freq: Once | Status: DC
  Filled 2021-08-21: qty 250

## 2021-08-21 MED ORDER — IPRATROPIUM-ALBUTEROL 0.5-2.5 (3) MG/3ML IN SOLN
3.0000 mL | Freq: Three times a day (TID) | RESPIRATORY_TRACT | Status: DC
  Administered 2021-08-21: 3 mL via RESPIRATORY_TRACT
  Filled 2021-08-21 (×3): qty 3

## 2021-08-21 MED ORDER — ACETAMINOPHEN 650 MG RE SUPP: 650.0000 mg | Freq: Four times a day (QID) | RECTAL | Status: DC | PRN

## 2021-08-21 MED ORDER — ONDANSETRON 4 MG PO TBDP: 4.0000 mg | ORAL_TABLET | Freq: Four times a day (QID) | ORAL | Status: DC | PRN

## 2021-08-21 MED ORDER — ACETAMINOPHEN 325 MG PO TABS: 650.0000 mg | ORAL_TABLET | Freq: Four times a day (QID) | ORAL | Status: DC | PRN

## 2021-08-21 MED ORDER — FENTANYL CITRATE PF 50 MCG/ML IJ SOSY
25.0000 ug | PREFILLED_SYRINGE | INTRAMUSCULAR | Status: DC | PRN
  Administered 2021-08-21 (×5): 25 ug via INTRAVENOUS
  Filled 2021-08-21 (×4): qty 1

## 2021-08-21 MED ORDER — POTASSIUM CHLORIDE 10 MEQ/100ML IV SOLN
10.0000 meq | INTRAVENOUS | Status: AC
  Administered 2021-08-21 (×3): 10 meq via INTRAVENOUS
  Filled 2021-08-21 (×3): qty 100

## 2021-08-21 MED ORDER — SODIUM CHLORIDE 0.9 % IV SOLN: 250.0000 mL | INTRAVENOUS | Status: DC | PRN

## 2021-08-21 MED ORDER — VITAMIN B-12 1000 MCG PO TABS: 1000.0000 ug | ORAL_TABLET | Freq: Every day | ORAL | Status: DC

## 2021-08-21 MED ORDER — POTASSIUM CHLORIDE CRYS ER 20 MEQ PO TBCR
40.0000 meq | EXTENDED_RELEASE_TABLET | Freq: Two times a day (BID) | ORAL | Status: DC
  Administered 2021-08-21: 40 meq via ORAL
  Filled 2021-08-21: qty 2

## 2021-08-21 MED ORDER — LORAZEPAM 1 MG PO TABS: 1.0000 mg | ORAL_TABLET | ORAL | Status: DC | PRN

## 2021-08-21 MED ORDER — MANNITOL 25 % IV SOLN
50.0000 g | Freq: Once | INTRAVENOUS | Status: DC
  Administered 2021-08-21: 50 g via INTRAVENOUS

## 2021-08-21 MED ORDER — SODIUM CHLORIDE 0.9% FLUSH: 3.0000 mL | INTRAVENOUS | Status: DC | PRN

## 2021-08-21 MED ORDER — LABETALOL HCL 5 MG/ML IV SOLN
20.0000 mg | Freq: Once | INTRAVENOUS | Status: DC
Start: 2021-08-21 — End: 2021-08-21

## 2021-08-21 MED ORDER — POLYVINYL ALCOHOL 1.4 % OP SOLN
1.0000 [drp] | Freq: Four times a day (QID) | OPHTHALMIC | Status: DC | PRN
Start: 2021-08-21 — End: 2021-08-23

## 2021-08-21 MED ORDER — BIOTENE DRY MOUTH MT LIQD: 15.0000 mL | OROMUCOSAL | Status: DC | PRN

## 2021-08-21 MED ORDER — CHLORHEXIDINE GLUCONATE CLOTH 2 % EX PADS
6.0000 | MEDICATED_PAD | Freq: Every day | CUTANEOUS | Status: DC
  Administered 2021-08-21: 6 via TOPICAL

## 2021-08-21 MED ORDER — SODIUM CHLORIDE 0.9% FLUSH
3.0000 mL | Freq: Two times a day (BID) | INTRAVENOUS | Status: DC
  Administered 2021-08-21 – 2021-08-22 (×3): 3 mL via INTRAVENOUS

## 2021-08-21 MED ORDER — ALBUTEROL SULFATE (2.5 MG/3ML) 0.083% IN NEBU: 2.5000 mg | INHALATION_SOLUTION | RESPIRATORY_TRACT | Status: DC | PRN

## 2021-08-21 MED ORDER — CLEVIDIPINE BUTYRATE 0.5 MG/ML IV EMUL
0.0000 mg/h | INTRAVENOUS | Status: DC
  Filled 2021-08-21: qty 50

## 2021-08-21 MED ORDER — LORAZEPAM 2 MG/ML PO CONC
1.0000 mg | ORAL | Status: DC | PRN
  Filled 2021-08-21: qty 0.5

## 2021-08-21 MED ORDER — ONDANSETRON HCL 4 MG/2ML IJ SOLN: 4.0000 mg | Freq: Four times a day (QID) | INTRAMUSCULAR | Status: DC | PRN

## 2021-08-21 MED ORDER — NALOXONE HCL 2 MG/2ML IJ SOSY
PREFILLED_SYRINGE | INTRAMUSCULAR | Status: AC
  Administered 2021-08-21: 2 mg
  Filled 2021-08-21: qty 2

## 2021-08-21 NOTE — Progress Notes (Signed)
   08/21/21 0618  Provider Notification  Provider Name/Title Dr. Thomes Dinning  Date Provider Notified 08/21/21  Time Provider Notified [phone]  Method of Notification  (secure chat)  Notification Reason Critical result  Test performed and critical result K+ 2.1  Date Critical Result Received 08/21/21  Time Critical Result Received 0610

## 2021-08-21 NOTE — Progress Notes (Signed)
Spoke with patient's cousin Ms. Tyler Deis. She states that she would like for Ms. Riggan to be made comfortable with no life sustaining measures. She doesn't have any questions about the plan of care or the events that have occurred this morning. Dr. Mariea Clonts witnessed conversation and agrees with plan of care for comfort measures.

## 2021-08-21 NOTE — Progress Notes (Signed)
Pt has been psych cleared per Assunta Found, NP. This CSW will now remove this pt from the [facility] shift report. TOC will asn assist and follow with discharge needs.  Maryjean Ka, MSW, LCSWA 08/21/2021 11:30 PM

## 2021-08-21 NOTE — Progress Notes (Signed)
-   I was called by RN to patient bedside as patient was found on the floor unconscious for few minutes after the RN that previously left the room - Apparently patient had received IV fentanyl 12.5 mg x 1 as well as oral hydrocodone earlier this morning for pain/headache - Upon arrival in the room patient is found to be in bed (staff to put the patient back in bed from the floor)--she was unresponsive, with spontaneous respirations and a pulse - Upon my examination I noticed that the right pupil was about 6 mm and the left pupil was about 2 mm -Code stroke was called immediately, CT head without contrast showed:- Severe Acute Intracranial Hemorrhage. Large intra-axial bleed in the posterior right hemisphere (67 mL) with extension into the right subdural space (subdural hematoma up to 9 mm in thickness). - Associated cerebral edema and intracranial mass effect with leftward midline shift of 18 mm. Effaced suprasellar cistern. Trapped left lateral ventricle.  -I discussed the CT head findings with radiologist Dr. Margo Aye , neurologist Dr Roda Shutters and neurosurgeon Dr. Marikay Alar--- as per neurosurgery overall prognosis is very poor - I also called and spoke with patient's second cousin Mr. Evalee Jefferson [phone] and patient's first question Ms. Huel Cote--- [phone]--- After further conversations with both of patient's cousins -They requested DNR/DNI status comfort care -They declined transfer to [facility] neuro ICU, they declined further aggressive interventions given overall poor prognosis - Nurse manager Craige Cotta also spoke with patient's cousin Ms  Huel Cote and confirmed request for DNR/DNI status and comfort care measures - I anticipate in-hospital death -Palliative/comfort care protocol ordered/initiated  - \CRITICAL CARE Performed by: Shon Hale   Total critical care time: 58 minutes  Critical care time was exclusive of separately billable procedures and treating  other patients.  Critical care was necessary to treat or prevent imminent or life-threatening deterioration.  Critical care was time spent personally by me on the following activities: development of treatment plan with patient and/or surrogate as well as nursing, discussions with consultants, evaluation of patient's response to treatment, examination of patient, obtaining history from patient or surrogate, ordering and performing treatments and interventions, ordering and review of laboratory studies, ordering and review of radiographic studies, pulse oximetry and re-evaluation of patient's condition.  Total care time included time spent coordinating care with neurology, radiology, neurosurgery and patient's family was over 58 minutes  Shon Hale, MD

## 2021-08-21 NOTE — Progress Notes (Signed)
We were called to look at the imaging on this unfortunate 61 year old female who was admitted with a pneumonia and then found unresponsive this morning.  She was found to be unresponsive with unequal pupils and a CT scan was ordered.  CT scan of the head showed a large right-sided intracerebral hemorrhage with severe mass effect and uncal herniation on the right with 18 mm shift, with expansion of the hemorrhage into the subdural space with a 9 mm holohemispheric subdural hematoma.  Patient was described as "doing nothing" and completely unresponsive though not intubated at that time.  It was my opinion that this was a devastating fatal hemorrhage, and all treatment would be futile.  I did not feel transfer for emergent craniotomy was warranted.  I recommended comfort care.  I received a message from the treating physician who states that after discussions the family has decided for comfort care.  I am sorry there was not more we could do.  Our prayers are with the family

## 2021-08-21 NOTE — Consult Note (Signed)
  Mindi Curling 61 y.o. female Patient admitted to [facility] ED after presenting in from home via EMS with complaints of shortness of breath, increased weakness x 1 day, diarrhea for one week, swelling and pain in right wrist x 1 day.    Psychiatric consult ordered by EDP.  Psychiatric consult completed and patient psychiatrically cleared o 08/20/21.    Elanora Quin  61 year old female with history of uncontrolled type I diabetes, diabetic neuropathy, retinopathy, lupus, depression, frequent urinary tract infection, chronic diarrhea, noncompliance with medications, chronic smoker, and failure to thrive.  She was medically admitted related to shortness of breath, hypoxia, and pneumonia.  According to chart review patient has had multiple referrals that she has not followed up with, recurrent hospitalizations, and forgetfulness.  With her multiple comorbidities listed above she continues to smoke and not follow up with needed medical care.    MD request that psychiatric assessment to perform medical capacity evaluation.   08/21/21 attempted to set up tele health assessment with patient for capacity evaluation but was informed by patients nurse that patient is currently unresponsive and has been put on comfort measures at this time.  Reporting patient had stroke this morning.  States if MD wants to attempt or still want to attempt with capacity evaluation will have reorder.     Fujie Dickison B. Marcedes Tech, NP

## 2021-08-21 NOTE — Progress Notes (Signed)
Prior to episode, patient alert and oriented to person, place, time, and situation. Vital signs were stable, repleting potassium via IV, patient in no distress other than throughout the morning the patient had repeatedly called for pain medications. PRN Fentanyl administered at 0756 per patient request to alleviate leg and back pain. After fentanyl administered, patient continued to call and ask for "more pain meds" and "these aren't working". While sitting on edge of bed and eating breakfast, patient complained of a headache and asked for hydrocodone. Per patient request, PRN hydrocodone administered at 0914 to help with headache. Due to the severity of headache, patient asked this RN to "rub the back of my neck", per request I massaged/rubbed the back of the patients neck to help her pain. At this time, patient remained sitting on the edge of bed and asked for a cup of coffee. This RN got the patient a fresh cup of coffee and patient started drinking and finishing her breakfast while sitting on the edge of bed. Approximate time of 0920, this RN left the room to round on other patients. Moments later, while helping the patient in room 330 to the bedside commode, EVS lady came to the door and notified me that the patient next door is in the floor. I immediately went to room 331 to check on Ms. Libby and confirmed she was in the floor lying beside her bed. She had emesis to the side of her and had urinary incontinence. Upon assessment, patient was mumbling incomprehensible words and would only respond to pain. Patient returned to bed by staff and vital signs taken. At this time the MD was paged to notify of fall and patient status. Dr. Denton Brick arrived at bedside to assess and noticed the patients pupils were irregular and fixated, the right pupil was 5-67mm dilated and the left was only 5mm. Dr. Denton Brick called for a code stroke and to transport for a STAT CT scan. CT scan revealed a large intracranial hemorrhage, MD  notified and patient transferred to ICU.

## 2021-08-21 NOTE — Progress Notes (Addendum)
Code stroke activated at [facility]. Per provider's report, LKW is 0915. Patient was found on floor by staff approximately 15 minutes later. Pupils unequal and patient unresponsive. Patient was taken to CT at 0949. Neurology paged for code stroke at [phone].

## 2021-08-21 NOTE — Progress Notes (Signed)
Patient returned from CT at 0957. ICH noted on CT images and Dr. Marisa Severin made aware. Left room to contact neurosurgery.Back up neuro MD paged at 1000. Dr. Roda Shutters connected to telestroke cart at 1004.Patient had just arrived to ICU.

## 2021-08-21 NOTE — Progress Notes (Signed)
Date and time results received: 08/21/21 1050 (use smartphrase ".now" to insert current time)  Test: pH- ABG Critical Value: 7.61  Name of Provider Notified: Courage, MD.  Orders Received? Or Actions Taken?:  Pt now comfort care, not escalating care.

## 2021-08-21 NOTE — Consult Note (Signed)
[facility] Telemedicine Consult   Requesting Provider: Dr. Mariea ClontsEmokpae  Chief Complaint: HA, vomiting and unresponsiveness, found down  HPI:  61 year old female with history of uncontrolled type I diabetes, lupus, smoker, depression, frequent UTI, chronic diarrhea, noncompliant with medication, diabetic neuropathy, retinopathy and failure to thrive admitted for short of breath and diarrhea.  Found to have hypoxia, hypokalemia, hyperglycemia and pneumonia.  Was treated with Levaquin, insulin, and breathing treatment.  This morning patient doing okay, awake alert, sitting in chair, had breakfast.  Around 9 AM, patient complained of leg pain and back pain and also posterior headache, asked RN to massage of her head also requested pain medication.  She was given hydrocodone at 9:14 AM.  At that time patient still largely at her baseline.  Around 9:30 AM, patient was found on the floor with vomitus, unresponsive.  She was sent to CT stat.  During the CT and come back from CT, patient vomited twice again.  CT head showed large right parietal occipital ICH with subdural extension, midline shift 1.8 cm.  On exam, patient unresponsive, right pupil blown, fixed.  Some spontaneous movement of right arm but otherwise no movement in other extremities.  BP 166/81.  Put on mannitol 50 g stat, labetalol 20 mg IV stat, Cleviprex for BP control.  Discussed with primary team Dr. Mariea ClontsEmokpae, who contacted family. I was then informed by Dr. Mariea ClontsEmokpae that family requested comfort care measure given her extreme poor prognosis.   LKW: 9:14 AM tpa given?: No, ICH ICH score = 3   Exam: Vitals:   08/21/21 0929 08/21/21 1014  BP: (!) 164/100 (!) 166/81  Pulse: 72 78  Resp:  20  Temp: 99.3 F (37.4 C) 97.6 F (36.4 C)  SpO2: 98% 96%     Temp:  [97.6 F (36.4 C)-99.7 F (37.6 C)] 97.6 F (36.4 C) (05/31 1014) Pulse Rate:  [72-93] 78 (05/31 1014) Resp:  [15-20] 20 (05/31 1014) BP: (118-166)/(63-100) 166/81  (05/31 1014) SpO2:  [83 %-99 %] 96 % (05/31 1014) Weight:  [45.1 kg] 45.1 kg (05/31 1014)  General - Well nourished, well developed, unresponsive.  Ophthalmologic - fundi not visualized due to noncooperation.  Cardiovascular - Regular rhythm and rate.  Neuro - unresponsive, eyes closed not open on voice, not following any simple commands.  Eyes midline, not tracking, not blinking to visual threat bilaterally, right pupil 6 mm fixed, left pupil 2 mm nonreactive to light.  Left facial droop. Tongue protrusion not corporative.  Mild right upper extremity withdraw to pain, no movement in other extremities. Sensation, coordination and gait not tested.   NIH Stroke Scale  Level Of Consciousness 0=Alert; keenly responsive 1=Arouse to minor stimulation 2=Requires repeated stimulation to arouse or movements to pain 3=postures or unresponsive 3  LOC Questions to Month and Age 43=Answers both questions correctly 1=Answers one question correctly or dysarthria/intubated/trauma/language barrier 2=Answers neither question correctly or aphasia 2  LOC Commands      -Open/Close eyes     -Open/close grip     -Pantomime commands if communication barrier 0=Performs both tasks correctly 1=Performs one task correctly 2=Performs neighter task correctly 2  Best Gaze     -Only assess horizontal gaze 0=Normal 1=Partial gaze palsy 2=Forced deviation, or total gaze paresis 0  Visual 0=No visual loss 1=Partial hemianopia 2=Complete hemianopia 3=Bilateral hemianopia (blind including cortical blindness) 3  Facial Palsy     -Use grimace if obtunded 0=Normal symmetrical movement 1=Minor paralysis (asymmetry) 2=Partial paralysis (lower face) 3=Complete paralysis (upper and  lower face) 2  Motor  0=No drift for 10/5 seconds 1=Drift, but does not hit bed 2=Some antigravity effort, hits  bed 3=No effort against gravity, limb falls 4=No movement 0=Amputation/joint fusion Right Arm 3     Leg 4    Left Arm 4      Leg 4  Limb Ataxia     - FNT/HTS 0=Absent or does not understand or paralyzed or amputation/joint fusion 1=Present in one limb 2=Present in two limbs 0  Sensory 0=Normal 1=Mild to moderate sensory loss 2=Severe to total sensory loss or coma/unresponsive 2  Best Language 0=No aphasia, normal 1=Mild to moderate aphasia 2=Severe aphasia 3=Mute, global aphasia, or coma/unresponsive 3  Dysarthria 0=Normal 1=Mild to moderate 2=Severe, unintelligible or mute/anarthric 0=intubated/unable to test 2  Extinction/Neglect 0=No abnormality 1=visual/tactile/auditory/spatia/personal inattention/Extinction to bilateral simultaneous stimulation 2=Profound neglect/extinction more than 1 modality  2  Total   36      Imaging Reviewed:  CT HEAD CODE STROKE WO CONTRAST  Addendum Date: 08/21/2021   ADDENDUM REPORT: 08/21/2021 10:17 ADDENDUM: Critical Value/emergent results were called by telephone at the time of interpretation on 08/21/2021 at 1008 hours to Dr. Shon Hale , who verbally acknowledged these results. Electronically Signed   By: Odessa Fleming M.D.   On: 08/21/2021 10:17   Result Date: 08/21/2021 CLINICAL DATA:  Code stroke. 61 year old female with altered mental status. EXAM: CT HEAD WITHOUT CONTRAST TECHNIQUE: Contiguous axial images were obtained from the base of the skull through the vertex without intravenous contrast. RADIATION DOSE REDUCTION: This exam was performed according to the departmental dose-optimization program which includes automated exposure control, adjustment of the mA and/or kV according to patient size and/or use of iterative reconstruction technique. COMPARISON:  Head CT without contrast 07/04/2017. FINDINGS: Brain: Fairly severe acute intracranial hemorrhage. Large posterior hemisphere intra-axial hematoma is approximately 50 x 46 by 58 mm (AP by transverse by CC) for an estimated intra-axial blood volume 67 mL. Blood is centered in the posterior right parietal and  occipital lobes. But there is extension of hemorrhage into the right side subdural space with superimposed hemispheric subdural hematoma on that side up to 9 mm in thickness (mixed density right anterior frontal convexity series 2, image 15). Superimposed subdural blood layering on the right tentorium and along the right falx also. Posterior hemispheric cerebral edema along the margins of the hematoma and substantial intracranial mass effect with leftward midline shift of 18 mm at the septum pellucidum. A faced lateral ventricles. The left atrium of the lateral ventricle is mildly dilated. The suprasellar cistern is effaced. Other visible basilar cisterns remain normal. No convincing intraventricular hemorrhage. 4th ventricle remains relatively normal. No contralateral left hemisphere edema. Vascular: Calcified atherosclerosis at the skull base. Skull: No skull fracture identified. Sinuses/Orbits: Visualized paranasal sinuses and mastoids are stable and well aerated. Other: Visualized orbits and scalp soft tissues are within normal limits. ASPECTS [facility] Stroke Program Early CT Score) Total score (0-10 with 10 being normal): Not applicable IMPRESSION: 1. Severe Acute Intracranial Hemorrhage. Large intra-axial bleed in the posterior right hemisphere (67 mL) with extension into the right subdural space (subdural hematoma up to 9 mm in thickness). 2. Associated cerebral edema and intracranial mass effect with leftward midline shift of 18 mm. Effaced suprasellar cistern. Trapped left lateral ventricle. 3. Recommend Neurosurgery consultation. Electronically Signed: By: Odessa Fleming M.D. On: 08/21/2021 10:02     Labs reviewed in epic and pertinent values follow: K 2.1, Cre 0.38, Platelet 269, INR 0.9  Assessment:  61 year old female with history of uncontrolled type I diabetes, lupus, smoker, depression, frequent UTI, chronic diarrhea, noncompliant with medication, diabetic neuropathy, retinopathy and failure to  thrive admitted for short of breath, hypoxia and pneumonia.  Complaint headache at 9 AM, last seen well 9:14 a.m. for medication.  Found down with unresponsiveness around 9:30 AM.  Vomited x3.  Stat CT showed large right parietal occipital ICH with subdural extension, midline shift 1.8 cm.  On exam, patient unresponsive, right pupil blown, fixed.  Some spontaneous movement of right arm but otherwise no movement in other extremities.  NIH score 36.  Put on mannitol 50 g stat, labetalol 20 mg IV stat, Cleviprex for BP control with BP goal 130-150.  Discussed with primary team Dr. Mariea Clonts, who contacted family and they requested comfort care measure given her extremely poor prognosis.   Recommendations:  - agree with comfort care measures given extremely poor prognosis - Discussed with Dr. Mariea Clonts - Please call us with further questions.  Thanks for the consult   Consult Participants: Patient, stroke response RN, bedside RNs Location of the provider: [facility] Location of the patient: [facility]  Time Code Stroke Page received:  10:00am Time neurologist arrived:  10:04am Time NIHSS completed: 10:15am    This consult was provided via telemedicine with 2-way video and audio communication. The patient/family was informed that care would be provided in this way and agreed to receive care in this manner.   This patient is receiving care for possible acute neurological changes. There was 45 minutes of care by this provider at the time of service, including time for direct evaluation via telemedicine, review of medical records, imaging studies and discussion of findings with providers, the patient and/or family.  Marvel Plan, MD PhD Stroke Neurology 08/21/2021 10:27 AM

## 2021-08-21 NOTE — TOC Progression Note (Signed)
Transition of Care [facility]) - Progression Note    Patient Details  Name: Autumn Daniels MRN: [id number] Date of Birth: 21-Jun-1960  [facility]) CM/SW Contact  Karn Cassis, [place] Phone Number: 08/21/2021, 11:53 AM  Clinical Narrative: Code stroke this morning called on pt. Per MD, pt is now comfort care with anticipated in hospital death. LCSW updated Kym Groom at [facility] and [facility].        Expected Discharge Plan: Assisted Living Barriers to Discharge: Continued Medical Work up  Expected Discharge Plan and Services Expected Discharge Plan: Assisted Living In-house Referral: Clinical Social Work   Post Acute Care Choice:  (ALF) Living arrangements for the past 2 months: Single Family Home                                       Social Determinants of Health (SDOH) Interventions    Readmission Risk Interventions     View : No data to display.

## 2021-08-22 DIAGNOSIS — F418 Other specified anxiety disorders: Secondary | ICD-10-CM | POA: Diagnosis not present

## 2021-08-22 LAB — SARS CORONAVIRUS 2 BY RT PCR: SARS Coronavirus 2 by RT PCR: NEGATIVE

## 2021-08-22 NOTE — Discharge Instructions (Signed)
1)Transfer to [facility] with Full comfort Measures/Hospice care

## 2021-08-22 NOTE — TOC Progression Note (Signed)
Transition of Care [facility]) - Progression Note    Patient Details  Name: Autumn Daniels MRN: [id number] Date of Birth: 1960-07-16  [facility]) CM/SW Contact  Elliot Gault, LCSW Phone Number: 08/22/2021, 12:36 PM  Clinical Narrative:     TOC following. Referral made to [facility] at family request. Awaiting return call regarding [facility] bed availability.  Expected Discharge Plan: Hospice Medical Facility Barriers to Discharge: Continued Medical Work up  Expected Discharge Plan and Services Expected Discharge Plan: Hospice Medical Facility In-house Referral: Clinical Social Work   Post Acute Care Choice: Hospice Living arrangements for the past 2 months: Single Family Home                                       Social Determinants of Health (SDOH) Interventions    Readmission Risk Interventions     View : No data to display.

## 2021-08-22 NOTE — TOC Transition Note (Signed)
Transition of Care [facility]) - CM/SW Discharge Note   Patient Details  Name: Dagny Fiorentino MRN: [id number] Date of Birth: 07-15-1960  Transition of Care [facility]) CM/SW Contact:  Elliot Gault, LCSW Phone Number: 08/22/2021, 4:22 PM   Clinical Narrative:     [facility] has bed availability and can accept pt today. DC clinical sent electronically. RN to call report. EMS arranged by Hospice liaison. Family aware and in agreement.  There are no other TOC needs for dc.  Final next level of care: Hospice Medical Facility Barriers to Discharge: Barriers Resolved   Patient Goals and CMS Choice Patient states their goals for this hospitalization and ongoing recovery are:: get better CMS Medicare.gov Compare Post Acute Care list provided to:: Patient Represenative (must comment) Choice offered to / list presented to :  (cousin)  Discharge Placement                       Discharge Plan and Services In-house Referral: Clinical Social Work   Post Acute Care Choice: Hospice                               Social Determinants of Health (SDOH) Interventions     Readmission Risk Interventions     View : No data to display.

## 2021-08-22 NOTE — Discharge Summary (Signed)
Autumn CurlingMary Daniels, is a 61 y.o. female  DOB January 30, 1961  MRN [id number].  Admission date:  08/19/2021  Admitting Physician  Autumn Fleetlanford Daniels Johnson, MD  Discharge Date:  08/22/2021   Primary MD  Autumn Daniels, Autumn L, NP  Recommendations for primary care physician for things to follow:   1)Transfer to [facility]idential [facility] with Full comfort Measures/Hospice care  Admission Diagnosis  Hypokalemia [E87.6] Hypoxia [R09.02] CAP (community acquired pneumonia) [J18.9] Pneumonia of left lower lobe due to infectious organism [J18.9]   Discharge Diagnosis  Hypokalemia [E87.6] Hypoxia [R09.02] CAP (community acquired pneumonia) [J18.9] Pneumonia of left lower lobe due to infectious organism [J18.9]    Principal Problem:   Anxiety with depression Active Problems:   Acute respiratory failure with hypoxemia (HCC)   Vitamin D deficiency   Hypothyroidism following radioiodine therapy   Type 1.5 diabetes mellitus with other specified complication (HCC)   GERD (gastroesophageal reflux disease)   Lupus (HCC)   Hypokalemia   Protein-calorie malnutrition, severe   CAP (community acquired pneumonia)   Hyperglycemia   Failure to thrive in adult   Noncompliance with medication regimen   COPD (chronic obstructive pulmonary disease) (HCC)   Hypoglycemia due to insulin      Past Medical History:  Diagnosis Date   Anemia    Anxiety    Depression    Diabetes mellitus without complication (HCC)    type 1   Diarrhea    as of 12/2014 - pt states that she's had diarrhea for a year   Fibromyalgia    Forgetfulness 10/2016   Frequent UTI    due to small urethra   GERD (gastroesophageal reflux disease)    Headache    hx of migraines   History of hiatal hernia    Lupus (HCC)    Memory difficulty 10/2016   Noncompliance    Pneumonia    Stevens-Daniels syndrome (HCC)    Thyroid disease    has had radioactive iodine treatment for  hyperthyroidism in 1990   Vitamin D deficiency 07/07/2014    Past Surgical History:  Procedure Laterality Date   CHOLECYSTECTOMY     2 bifurcations of liver during lap chole   EXTERNAL FIXATION LEG Right 06/30/2014   Procedure: EXTERNAL FIXATION LEG;  Surgeon: Vickki HearingStanley E Harrison, MD;  Location: [facility];  Service: Orthopedics;  Laterality: Right;  synthes large fragment exteranal fixation  c arm    EXTERNAL FIXATION LEG Right 07/07/2014   Procedure: REVISION EXTERNAL FIXATION LEG;  Surgeon: Myrene GalasMichael Handy, MD;  [facility];  Service: Orthopedics;  Laterality: Right;   EXTERNAL FIXATION REMOVAL Right 07/18/2014   Procedure: REMOVAL EXTERNAL FIXATION LEG;  Surgeon: Myrene GalasMichael Handy, MD;  [facility];  Service: Orthopedics;  Laterality: Right;   HARDWARE REMOVAL Right 01/09/2015   Procedure: HARDWARE REMOVAL,RIGHT PROXIMAL TIBIAL;  Surgeon: Myrene GalasMichael Handy, MD;  [facility];  Service: Orthopedics;  Laterality: Right;   KNEE ARTHROSCOPY Right 01/09/2015   Procedure: ARTHROSCOPY KNEE RIGHT ;  Surgeon: Myrene GalasMichael Handy, MD;  [facility];  Service: Orthopedics;  Laterality: Right;   LIVER SURGERY     x4 due to bifurcations from lap chole   ORIF TIBIA PLATEAU Right 07/18/2014   Procedure: RIGHT OPEN REDUCTION INTERNAL FIXATION (ORIF) TIBIA FRACTURE;  Surgeon: Myrene Galas, MD;  Location: [facility];  Service: Orthopedics;  Laterality: Right;  ORIF of bicondylar plateau fracture   STERIOD INJECTION Bilateral 01/09/2015   Procedure: RIGHT RING FINGER AND LEFT RING FINGER STEROID INJECTION;  Surgeon: Myrene Galas, MD;  Location: [facility] OR;  Service: Orthopedics;  Laterality: Bilateral;   TONSILLECTOMY       HPI  from the history and physical done on the day of admission:     Chief Complaint: SOB   HPI: Autumn Daniels is a 61 y/o female with multiple serious medical comorbidities including type 1 diabetes mellitus, chronic depression, GERD, systemic lupus, frequent UTIs, fibromyalgia, chronic tobacco  abuse, hypokalemia, intermittent bouts of chronic diarrhea, long history of poor compliance with taking medicines, following up with physicians and following recommendations.  She continues to smoke cigarettes despite being counseled at length about the dangers of smoking and diabetes mellitus.  Patient has reported unstable lupus because she is refusing to follow-up with rheumatology.  She also has had multiple referrals and missed appointments with endocrinology but continues to no-show for appointments.  She rarely takes her basal insulin and her A1c remains greater than 10 up to greater than 12.  She has complications associated with long-term poorly controlled diabetes mellitus including diabetic neuropathy, nephropathy, retinopathy.  Patient is severely emaciated likely due to not taking enough insulin and has vitamin D deficiency, forgetfulness, multiple bouts of pneumonia, and recurrent hospitalizations.   Patient presented to emergency department by EMS complaining of shortness of breath weakness and diarrhea x1 week.  She also complained of right wrist pain and swelling 7 out of 10.  She was noted to have a pulse ox of 88% on room air.  She is not on home oxygen.  She reports that most of the night she felt unwell and was short of breath more than her baseline.  She reports nonproductive cough no fever or chills.  She also reports ankle edema.  She lives by herself.  She was last hospitalized in December 2022.  At that time she had severe hypokalemia and severe weakness and was unable to get out of bed at that time.  Pt also has a history of leaving hospital against medical advice.     Today she is noted to be hypoxemic.  She had a PE O2 of 77 with a PCO2 of 55.  She was hypokalemic with a potassium of 2.4 and a glucose of 391.  She had a mildly elevated BNP at 200.0.  Her high-sensitivity troponin was 15.  Her lactic acid was 1.2.  She had a normal CBC.  Her SARS 2 coronavirus test and influenza  testing was negative.  There was a retrocardiac opacity noted on chest x-ray consistent with pneumonia and patient was noted to have had a CT chest in 2021 that demonstrated a pulmonary nodule in that area and the radiologist is recommended that patient have a repeat CT chest done after completion of antibiotics.  The patient is being admitted into the hospital for further management.     Hospital Course:     61 y/o female with multiple serious medical comorbidities including type 1 diabetes mellitus, chronic depression, GERD, systemic lupus, frequent UTIs, fibromyalgia, chronic tobacco abuse, hypokalemia, intermittent bouts of chronic diarrhea,  long history of poor compliance with taking medicines, following up with physicians and following recommendations.  She continues to smoke cigarettes despite being counseled at length about the dangers of smoking and diabetes mellitus.  Patient has reported unstable lupus because she is refusing to follow-up with rheumatology.  She also has had multiple referrals and missed appointments with endocrinology but continues to no-show for appointments.  She rarely takes her basal insulin and her A1c remains greater than 10 up to greater than 12.  She has complications associated with long-term poorly controlled diabetes mellitus including diabetic neuropathy, nephropathy, retinopathy.  Patient is severely emaciated likely due to not taking enough insulin and has vitamin D deficiency, forgetfulness, multiple bouts of pneumonia, and recurrent hospitalizations.  Patient presented to emergency department by EMS complaining of shortness of breath weakness and diarrhea x1 week.  She also complained of right wrist pain and swelling 7 out of 10.  She was noted to have a pulse ox of 88% on room air.  She is not on home oxygen.  She reports that most of the night she felt unwell and was short of breath more than her baseline.  She reports nonproductive cough no fever or chills.   She also reports ankle edema.  She lives by herself.  She was last hospitalized in December 2022.  At that time she had severe hypokalemia and severe weakness and was unable to get out of bed at that time.  Pt also has a history of leaving hospital against medical advice.    Today she is noted to be hypoxemic.  She had a PE O2 of 77 with a PCO2 of 55.  She was hypokalemic with a potassium of 2.4 and a glucose of 391.  She had a mildly elevated BNP at 200.0.  Her high-sensitivity troponin was 15.  Her lactic acid was 1.2.  She had a normal CBC.  Her SARS 2 coronavirus test and influenza testing was negative.  There was a retrocardiac opacity noted on chest x-ray consistent with pneumonia and patient was noted to have had a CT chest in 2021 that demonstrated a pulmonary nodule in that area and the radiologist is recommended that patient have a repeat CT chest done after completion of antibiotics.  The patient is being admitted into the hospital for further management.  08/20/2021 : pt has been obstinate at times and threatening to leave AMA, however, after talking with her further she is agreeable to consider ALF placement.  Also requested TTS consult for capacity evaluation.     Assessment and Plan:  08/21/21 I was called by RN to patient bedside as patient was found on the floor unconscious for few minutes after the RN that previously left the room -\ Apparently patient had received IV fentanyl 12.5 mg x 1 as well as oral hydrocodone earlier this morning for pain/headache - Upon arrival in the room patient is found to be in bed (staff to put the patient back in bed from the floor)--she was unresponsive, with spontaneous respirations and a pulse - Upon my examination I noticed that the right pupil was about 6 mm and the left pupil was about 2 mm -Code stroke was called immediately, CT head without contrast showed:- Severe Acute Intracranial Hemorrhage. Large intra-axial bleed in the posterior right  hemisphere (67 mL) with extension into the right subdural space (subdural hematoma up to 9 mm in thickness). - Associated cerebral edema and intracranial mass effect with leftward midline shift of 18 mm.  Effaced suprasellar cistern. Trapped left lateral ventricle.   -I discussed the CT head findings with radiologist Dr. Margo Aye , neurologist Dr Roda Shutters and neurosurgeon Dr. Marikay Alar--- as per neurosurgery overall prognosis is very poor - I also called and spoke with patient's second cousin Mr. Evalee Jefferson [phone] and patient's first question Ms. Huel Cote--- [phone]--- After further conversations with both of patient's cousins -They requested DNR/DNI status comfort care -They declined transfer to [facility] neuro ICU, they declined further aggressive interventions given overall poor prognosis - Nurse manager Craige Cotta also spoke   08/22/21 Pts second cousin Evalee Jefferson at Bedside Patient's first cousin Mrs. Huel Cote is on  speaker phone -Hospice consultation discussed -Family spoke with hospice team -They request transfer to residential hospice house with comfort care - -Please see full H&P and subsequent progress notes for details of patient's hospital stay prior to the events of the morning of 08/21/2021 when patient was found unresponsive and clinical and imaging studies confirmed brain bleed  - Overall prognosis is grave  Discharge Condition: Grave prognosis  Consults obtained - Hospice  Diet and Activity recommendation:  As advised  Discharge Instructions  --- transfer to residential hospice house  Discharge Instructions     Call MD for:  persistant nausea and vomiting   Complete by: As directed    Call MD for:  severe uncontrolled pain   Complete by: As directed    Call MD for:  temperature >100.4   Complete by: As directed    Diet general   Complete by: As directed    Discharge instructions   Complete by: As directed    1)Transfer to Residential Hospice  with Full comfort Measures/Hospice care         Discharge Medications     Allergies as of 08/22/2021       Reactions   Penicillins Other (See Comments)   Did it involve swelling of the face/tongue/throat, SOB, or low BP? Yes Did it involve sudden or severe rash/hives, skin peeling, or any reaction on the inside of your mouth or nose? Yes Did you need to seek medical attention at a hospital or doctor's office? Yes When did it last happen?      61 years old If all above answers are "NO", may proceed with cephalosporin use. Tolerated cephalexin in past. Patient has SJS which prevents taking this medications   Sulfa Antibiotics Other (See Comments)   Patient has SJS which prevents taking this medication        Medication List     STOP taking these medications    albuterol 108 (90 Base) MCG/ACT inhaler Commonly known as: VENTOLIN HFA   gabapentin 400 MG capsule Commonly known as: Neurontin   insulin glargine 100 UNIT/ML Solostar Pen Commonly known as: LANTUS   Insulin Pen Needle 32G X 4 MM Misc   levothyroxine 150 MCG tablet Commonly known as: SYNTHROID   LORazepam 0.5 MG tablet Commonly known as: ATIVAN   NovoLOG FlexPen 100 UNIT/ML FlexPen Generic drug: insulin aspart   potassium chloride SA 20 MEQ tablet Commonly known as: KLOR-CON M   rOPINIRole 1 MG tablet Commonly known as: REQUIP   traMADol 50 MG tablet Commonly known as: ULTRAM   Vitamin D3 1.25 MG (50000 UT) Caps        Major procedures and Radiology Reports - PLEASE review detailed and final reports for all details, in brief -     DG Chest Port 1 View  Result Date: 08/19/2021  CLINICAL DATA:  Cough and shortness of breath EXAM: PORTABLE CHEST 1 VIEW COMPARISON:  02/11/2021 FINDINGS: Retrocardiac airspace opacity. Artifact from EKG leads. Background interstitial coarsening which is generalized. No visible effusion or pneumothorax. Normal heart size and mediastinal contours. IMPRESSION:  Retrocardiac opacity, history suggesting pneumonia. There was a pulmonary nodule in this area on 2021 chest CT, recommend follow-up chest CT after completed antibiotics. Electronically Signed   By: Tiburcio Pea M.D.   On: 08/19/2021 10:50   CT HEAD CODE STROKE WO CONTRAST  Addendum Date: 08/21/2021   ADDENDUM REPORT: 08/21/2021 10:17 ADDENDUM: Critical Value/emergent results were called by telephone at the time of interpretation on 08/21/2021 at 1008 hours to Dr. Shon Hale , who verbally acknowledged these results. Electronically Signed   By: Odessa Fleming M.D.   On: 08/21/2021 10:17   Result Date: 08/21/2021 CLINICAL DATA:  Code stroke. 61 year old female with altered mental status. EXAM: CT HEAD WITHOUT CONTRAST TECHNIQUE: Contiguous axial images were obtained from the base of the skull through the vertex without intravenous contrast. RADIATION DOSE REDUCTION: This exam was performed according to the departmental dose-optimization program which includes automated exposure control, adjustment of the mA and/or kV according to patient size and/or use of iterative reconstruction technique. COMPARISON:  Head CT without contrast 07/04/2017. FINDINGS: Brain: Fairly severe acute intracranial hemorrhage. Large posterior hemisphere intra-axial hematoma is approximately 50 x 46 by 58 mm (AP by transverse by CC) for an estimated intra-axial blood volume 67 mL. Blood is centered in the posterior right parietal and occipital lobes. But there is extension of hemorrhage into the right side subdural space with superimposed hemispheric subdural hematoma on that side up to 9 mm in thickness (mixed density right anterior frontal convexity series 2, image 15). Superimposed subdural blood layering on the right tentorium and along the right falx also. Posterior hemispheric cerebral edema along the margins of the hematoma and substantial intracranial mass effect with leftward midline shift of 18 mm at the septum pellucidum. A  faced lateral ventricles. The left atrium of the lateral ventricle is mildly dilated. The suprasellar cistern is effaced. Other visible basilar cisterns remain normal. No convincing intraventricular hemorrhage. 4th ventricle remains relatively normal. No contralateral left hemisphere edema. Vascular: Calcified atherosclerosis at the skull base. Skull: No skull fracture identified. Sinuses/Orbits: Visualized paranasal sinuses and mastoids are stable and well aerated. Other: Visualized orbits and scalp soft tissues are within normal limits. ASPECTS [facility] Stroke Program Early CT Score) Total score (0-10 with 10 being normal): Not applicable IMPRESSION: 1. Severe Acute Intracranial Hemorrhage. Large intra-axial bleed in the posterior right hemisphere (67 mL) with extension into the right subdural space (subdural hematoma up to 9 mm in thickness). 2. Associated cerebral edema and intracranial mass effect with leftward midline shift of 18 mm. Effaced suprasellar cistern. Trapped left lateral ventricle. 3. Recommend Neurosurgery consultation. Electronically Signed: By: Odessa Fleming M.D. On: 08/21/2021 10:02    Micro Results    Recent Results (from the past 240 hour(s))  Urine Culture     Status: Abnormal   Collection Time: 08/19/21 10:14 AM   Specimen: Urine, Clean Catch  Result Value Ref Range Status   Specimen Description   Final    URINE, CLEAN CATCH Performed at [facility], [address] [place], [place] [address]    Special Requests   Final    NONE Performed at [facility], [address], [place], [place] [address]    Culture (A)  Final    50,000 COLONIES/mL  LACTOBACILLUS SPECIES Standardized susceptibility testing for this organism is not available. Performed at [facility], [address] [place], [place] [address]    Report Status 08/20/2021 FINAL  Final  Resp Panel by RT-PCR (Flu A&B, Covid) Anterior Nasal Swab     Status: None   Collection Time: 08/19/21 10:37 AM   Specimen:  Anterior Nasal Swab  Result Value Ref Range Status   SARS Coronavirus 2 by RT PCR NEGATIVE NEGATIVE Final    Comment: (NOTE) SARS-CoV-2 target nucleic acids are NOT DETECTED.  The SARS-CoV-2 RNA is generally detectable in upper respiratory specimens during the acute phase of infection. The lowest concentration of SARS-CoV-2 viral copies this assay can detect is 138 copies/mL. A negative result does not preclude SARS-Cov-2 infection and should not be used as the sole basis for treatment or other patient management decisions. A negative result may occur with  improper specimen collection/handling, submission of specimen other than nasopharyngeal swab, presence of viral mutation(s) within the areas targeted by this assay, and inadequate number of viral copies(<138 copies/mL). A negative result must be combined with clinical observations, patient history, and epidemiological information. The expected result is Negative.  Fact Sheet for Patients:  BloggerCourse.com  Fact Sheet for Healthcare Providers:  SeriousBroker.it  This test is no t yet approved or cleared by the [place] FDA and  has been authorized for detection and/or diagnosis of SARS-CoV-2 by FDA under an Emergency Use Authorization (EUA). This EUA will remain  in effect (meaning this test can be used) for the duration of the COVID-19 declaration under Section 564(b)(1) of the Act, 21 U.S.C.section 360bbb-3(b)(1), unless the authorization is terminated  or revoked sooner.       Influenza A by PCR NEGATIVE NEGATIVE Final   Influenza B by PCR NEGATIVE NEGATIVE Final    Comment: (NOTE) The Xpert Xpress SARS-CoV-2/FLU/RSV plus assay is intended as an aid in the diagnosis of influenza from Nasopharyngeal swab specimens and should not be used as a sole basis for treatment. Nasal washings and aspirates are unacceptable for Xpert Xpress SARS-CoV-2/FLU/RSV testing.  Fact  Sheet for Patients: BloggerCourse.com  Fact Sheet for Healthcare Providers: SeriousBroker.it  This test is not yet approved or cleared by the [place] FDA and has been authorized for detection and/or diagnosis of SARS-CoV-2 by FDA under an Emergency Use Authorization (EUA). This EUA will remain in effect (meaning this test can be used) for the duration of the COVID-19 declaration under Section 564(b)(1) of the Act, 21 U.S.C. section 360bbb-3(b)(1), unless the authorization is terminated or revoked.  Performed at [facility], [address] [place], [place] [address]   Blood Culture (routine x 2)     Status: None (Preliminary result)   Collection Time: 08/19/21 10:49 AM   Specimen: Right Antecubital; Blood  Result Value Ref Range Status   Specimen Description RIGHT ANTECUBITAL  Final   Special Requests   Final    BOTTLES DRAWN AEROBIC AND ANAEROBIC Blood Culture adequate volume   Culture   Final    NO GROWTH 3 DAYS Performed at [facility], [address] [place], [place] [address]    Report Status PENDING  Incomplete  Blood Culture (routine x 2)     Status: None (Preliminary result)   Collection Time: 08/19/21 10:53 AM   Specimen: BLOOD RIGHT HAND  Result Value Ref Range Status   Specimen Description BLOOD RIGHT HAND  Final   Special Requests   Final    BOTTLES DRAWN AEROBIC  AND ANAEROBIC Blood Culture adequate volume   Culture   Final    NO GROWTH 3 DAYS Performed at [facility], [address] [place], [place] [address]    Report Status PENDING  Incomplete  MRSA Next Gen by PCR, Nasal     Status: None   Collection Time: 08/21/21 10:15 AM   Specimen: Nasal Mucosa; Nasal Swab  Result Value Ref Range Status   MRSA by PCR Next Gen NOT DETECTED NOT DETECTED Final    Comment: (NOTE) The GeneXpert MRSA Assay (FDA approved for NASAL specimens only), is one component of a comprehensive MRSA colonization  surveillance program. It is not intended to diagnose MRSA infection nor to guide or monitor treatment for MRSA infections. Test performance is not FDA approved in patients less than 41 years old. Performed at [facility], [address] [place], [place] [address]   SARS Coronavirus 2 by RT PCR (hospital order, performed in [facility] hospital lab) *cepheid single result test* Anterior Nasal Swab     Status: None   Collection Time: 08/22/21  1:48 PM   Specimen: Anterior Nasal Swab  Result Value Ref Range Status   SARS Coronavirus 2 by RT PCR NEGATIVE NEGATIVE Final    Comment: (NOTE) SARS-CoV-2 target nucleic acids are NOT DETECTED.  The SARS-CoV-2 RNA is generally detectable in upper and lower respiratory specimens during the acute phase of infection. The lowest concentration of SARS-CoV-2 viral copies this assay can detect is 250 copies / mL. A negative result does not preclude SARS-CoV-2 infection and should not be used as the sole basis for treatment or other patient management decisions.  A negative result may occur with improper specimen collection / handling, submission of specimen other than nasopharyngeal swab, presence of viral mutation(s) within the areas targeted by this assay, and inadequate number of viral copies (<250 copies / mL). A negative result must be combined with clinical observations, patient history, and epidemiological information.  Fact Sheet for Patients:   RoadLapTop.co.za  Fact Sheet for Healthcare Providers: http://kim-miller.com/  This test is not yet approved or  cleared by the [place] FDA and has been authorized for detection and/or diagnosis of SARS-CoV-2 by FDA under an Emergency Use Authorization (EUA).  This EUA will remain in effect (meaning this test can be used) for the duration of the COVID-19 declaration under Section 564(b)(1) of the Act, 21 U.S.C. section 360bbb-3(b)(1), unless the  authorization is terminated or revoked sooner.  Performed at [facility], [address] [place], [place] [address]     Today   Subjective    Autumn Daniels today has no new complaints         -pt appears comfortable 08/22/21 Pts second cousin Evalee Jefferson at Bedside Patient's first cousin Mrs. Huel Cote is on  speaker phone -Hospice consultation discussed -Family spoke with hospice team -They request transfer to residential hospice house with comfort care   Patient has been seen and examined prior to discharge   Objective   Blood pressure (!) 164/96, pulse 84, temperature 98.8 F (37.1 C), temperature source Oral, resp. rate (!) 22, height 4\' 11"  (1.499 m), weight 45.1 kg, SpO2 98 %.   Intake/Output Summary (Last 24 hours) at 08/22/2021 1629 Last data filed at 08/22/2021 0500 Gross per 24 hour  Intake 0 ml  Output 1450 ml  Net -1450 ml    Exam Gen:-Resting comfortably, no purposeful movement  HEENT:-Unequal pupils  Lungs-fair symmetrical air movement, no wheezing  CV- S1, S2  normal, regular Abd-  +ve B.Sounds, Abd Soft, No tenderness,    Extremity/Skin:- No  edema,   good pulses Neuro-Psych-not responding to tactile or verbal stimuli, withdraws to noxious stimuli -No purposeful movement per se, largely obtunded  Data Review   CBC w Diff:  Lab Results  Component Value Date   WBC 13.4 (H) 08/21/2021   HGB 12.2 08/21/2021   HCT 37.9 08/21/2021   PLT 217 08/21/2021   LYMPHOPCT 26 08/19/2021   MONOPCT 11 08/19/2021   EOSPCT 1 08/19/2021   BASOPCT 1 08/19/2021    CMP:  Lab Results  Component Value Date   NA 139 08/21/2021   K 2.1 (LL) 08/21/2021   CL 96 ([facility]s) 08/21/2021   CO2 35 (H) 08/21/2021   BUN 9 08/21/2021   CREATININE 0.38 (Daniels) 08/21/2021   PROT 6.1 (Daniels) 08/19/2021   ALBUMIN 3.1 (Daniels) 08/19/2021   BILITOT 0.6 08/19/2021   ALKPHOS 104 08/19/2021   AST 21 08/19/2021   ALT 7 08/19/2021  .  Total Discharge time is about 33 minutes  Shon Hale  M.D on 08/22/2021 at 4:29 PM  Go to [email or web address] -  for contact info  [facility] - Office  [phone]

## 2021-08-23 LAB — VITAMIN C: Vitamin C: 0.2 mg/dL — ABNORMAL LOW (ref 0.4–2.0)

## 2021-08-24 LAB — CULTURE, BLOOD (ROUTINE X 2)
Culture: NO GROWTH
Culture: NO GROWTH
Special Requests: ADEQUATE
Special Requests: ADEQUATE

## 2021-08-29 NOTE — Progress Notes (Signed)
CODE STROKE TIMES CT 944 CALL TIME 946 BEEPER TIME 951 EXAM STARTED 952 EXAM FINISHED 952 IMAGES SENT TO SOC 952 EXAM COMPLETE IN EPIC 951 [facility] CALLED
